# Patient Record
Sex: Female | Born: 1935 | Race: White | Hispanic: No | Smoking: Never smoker
Health system: Southern US, Community
[De-identification: ages and names within clinical notes are randomized; demographics above are authoritative.]

## PROBLEM LIST (undated history)

## (undated) DIAGNOSIS — M810 Age-related osteoporosis without current pathological fracture: Secondary | ICD-10-CM

## (undated) DIAGNOSIS — M1990 Unspecified osteoarthritis, unspecified site: Secondary | ICD-10-CM

## (undated) DIAGNOSIS — E785 Hyperlipidemia, unspecified: Secondary | ICD-10-CM

## (undated) DIAGNOSIS — M549 Dorsalgia, unspecified: Secondary | ICD-10-CM

## (undated) DIAGNOSIS — H04123 Dry eye syndrome of bilateral lacrimal glands: Secondary | ICD-10-CM

## (undated) DIAGNOSIS — G8929 Other chronic pain: Secondary | ICD-10-CM

## (undated) DIAGNOSIS — M48061 Spinal stenosis, lumbar region without neurogenic claudication: Secondary | ICD-10-CM

## (undated) DIAGNOSIS — H409 Unspecified glaucoma: Secondary | ICD-10-CM

## (undated) DIAGNOSIS — I509 Heart failure, unspecified: Secondary | ICD-10-CM

## (undated) DIAGNOSIS — G629 Polyneuropathy, unspecified: Secondary | ICD-10-CM

## (undated) DIAGNOSIS — H269 Unspecified cataract: Secondary | ICD-10-CM

## (undated) DIAGNOSIS — J84112 Idiopathic pulmonary fibrosis: Secondary | ICD-10-CM

## (undated) DIAGNOSIS — E559 Vitamin D deficiency, unspecified: Secondary | ICD-10-CM

## (undated) HISTORY — DX: Dry eye syndrome of bilateral lacrimal glands: H04.123

## (undated) HISTORY — DX: Unspecified cataract: H26.9

## (undated) HISTORY — DX: Unspecified osteoarthritis, unspecified site: M19.90

## (undated) HISTORY — PX: KNEE ARTHROSCOPY: SHX127

## (undated) HISTORY — PX: ABDOMINAL HYSTERECTOMY: SHX81

## (undated) HISTORY — DX: Vitamin D deficiency, unspecified: E55.9

## (undated) HISTORY — PX: LUMBAR LAMINECTOMY: SHX95

## (undated) HISTORY — DX: Hyperlipidemia, unspecified: E78.5

## (undated) HISTORY — PX: OTHER SURGICAL HISTORY: SHX169

## (undated) HISTORY — DX: Dorsalgia, unspecified: M54.9

## (undated) HISTORY — DX: Other chronic pain: G89.29

## (undated) HISTORY — DX: Age-related osteoporosis without current pathological fracture: M81.0

## (undated) HISTORY — DX: Unspecified glaucoma: H40.9

## (undated) HISTORY — DX: Polyneuropathy, unspecified: G62.9

## (undated) HISTORY — DX: Idiopathic pulmonary fibrosis: J84.112

## (undated) HISTORY — PX: CATARACT EXTRACTION: SUR2

## (undated) HISTORY — DX: Spinal stenosis, lumbar region without neurogenic claudication: M48.061

## (undated) HISTORY — DX: Heart failure, unspecified: I50.9

---

## 1997-11-01 ENCOUNTER — Encounter: Payer: Self-pay | Admitting: Neurosurgery

## 1997-11-01 ENCOUNTER — Ambulatory Visit (HOSPITAL_COMMUNITY): Admission: RE | Admit: 1997-11-01 | Discharge: 1997-11-01 | Payer: Self-pay | Admitting: Neurosurgery

## 1997-11-15 ENCOUNTER — Encounter: Payer: Self-pay | Admitting: Neurosurgery

## 1997-11-15 ENCOUNTER — Ambulatory Visit (HOSPITAL_COMMUNITY): Admission: RE | Admit: 1997-11-15 | Discharge: 1997-11-15 | Payer: Self-pay | Admitting: Neurosurgery

## 1997-11-29 ENCOUNTER — Encounter: Payer: Self-pay | Admitting: Neurosurgery

## 1997-11-29 ENCOUNTER — Ambulatory Visit (HOSPITAL_COMMUNITY): Admission: RE | Admit: 1997-11-29 | Discharge: 1997-11-29 | Payer: Self-pay | Admitting: Neurosurgery

## 2001-04-13 ENCOUNTER — Encounter: Payer: Self-pay | Admitting: Neurosurgery

## 2001-04-13 ENCOUNTER — Encounter: Admission: RE | Admit: 2001-04-13 | Discharge: 2001-04-13 | Payer: Self-pay | Admitting: Neurosurgery

## 2001-05-11 ENCOUNTER — Encounter: Payer: Self-pay | Admitting: Neurosurgery

## 2001-05-11 ENCOUNTER — Encounter: Admission: RE | Admit: 2001-05-11 | Discharge: 2001-05-11 | Payer: Self-pay | Admitting: Neurosurgery

## 2001-05-25 ENCOUNTER — Encounter: Admission: RE | Admit: 2001-05-25 | Discharge: 2001-05-25 | Payer: Self-pay | Admitting: Neurosurgery

## 2001-05-25 ENCOUNTER — Encounter: Payer: Self-pay | Admitting: Neurosurgery

## 2001-06-08 ENCOUNTER — Encounter: Admission: RE | Admit: 2001-06-08 | Discharge: 2001-06-08 | Payer: Self-pay | Admitting: Neurosurgery

## 2001-06-08 ENCOUNTER — Encounter: Payer: Self-pay | Admitting: Neurosurgery

## 2001-11-13 ENCOUNTER — Encounter: Payer: Self-pay | Admitting: Neurosurgery

## 2001-11-13 ENCOUNTER — Inpatient Hospital Stay (HOSPITAL_COMMUNITY): Admission: RE | Admit: 2001-11-13 | Discharge: 2001-11-14 | Payer: Self-pay | Admitting: Neurosurgery

## 2002-07-13 ENCOUNTER — Encounter: Payer: Self-pay | Admitting: Neurosurgery

## 2002-07-13 ENCOUNTER — Ambulatory Visit (HOSPITAL_COMMUNITY): Admission: RE | Admit: 2002-07-13 | Discharge: 2002-07-13 | Payer: Self-pay | Admitting: Neurosurgery

## 2002-08-09 ENCOUNTER — Encounter: Payer: Self-pay | Admitting: Neurosurgery

## 2002-08-09 ENCOUNTER — Encounter: Admission: RE | Admit: 2002-08-09 | Discharge: 2002-08-09 | Payer: Self-pay | Admitting: Neurosurgery

## 2002-08-19 ENCOUNTER — Encounter: Admission: RE | Admit: 2002-08-19 | Discharge: 2002-08-19 | Payer: Self-pay | Admitting: Neurosurgery

## 2002-08-19 ENCOUNTER — Encounter: Payer: Self-pay | Admitting: Neurosurgery

## 2002-09-02 ENCOUNTER — Encounter: Payer: Self-pay | Admitting: Neurosurgery

## 2002-09-02 ENCOUNTER — Encounter: Admission: RE | Admit: 2002-09-02 | Discharge: 2002-09-02 | Payer: Self-pay | Admitting: Neurosurgery

## 2003-08-01 ENCOUNTER — Encounter: Admission: RE | Admit: 2003-08-01 | Discharge: 2003-08-01 | Payer: Self-pay | Admitting: Neurosurgery

## 2003-12-21 ENCOUNTER — Ambulatory Visit (HOSPITAL_COMMUNITY): Admission: RE | Admit: 2003-12-21 | Discharge: 2003-12-21 | Payer: Self-pay | Admitting: Family Medicine

## 2004-01-10 ENCOUNTER — Other Ambulatory Visit: Admission: RE | Admit: 2004-01-10 | Discharge: 2004-01-10 | Payer: Self-pay | Admitting: Family Medicine

## 2004-03-30 ENCOUNTER — Ambulatory Visit (HOSPITAL_COMMUNITY): Admission: RE | Admit: 2004-03-30 | Discharge: 2004-03-30 | Payer: Self-pay | Admitting: Family Medicine

## 2004-04-11 ENCOUNTER — Encounter: Admission: RE | Admit: 2004-04-11 | Discharge: 2004-04-11 | Payer: Self-pay | Admitting: Neurosurgery

## 2004-05-09 ENCOUNTER — Encounter: Admission: RE | Admit: 2004-05-09 | Discharge: 2004-05-09 | Payer: Self-pay | Admitting: Neurosurgery

## 2004-05-24 ENCOUNTER — Encounter: Admission: RE | Admit: 2004-05-24 | Discharge: 2004-05-24 | Payer: Self-pay | Admitting: Neurosurgery

## 2004-05-31 ENCOUNTER — Encounter: Admission: RE | Admit: 2004-05-31 | Discharge: 2004-05-31 | Payer: Self-pay | Admitting: Neurosurgery

## 2004-09-14 ENCOUNTER — Encounter: Admission: RE | Admit: 2004-09-14 | Discharge: 2004-09-14 | Payer: Self-pay | Admitting: Neurosurgery

## 2004-12-10 ENCOUNTER — Encounter: Admission: RE | Admit: 2004-12-10 | Discharge: 2004-12-10 | Payer: Self-pay | Admitting: Neurosurgery

## 2005-05-17 ENCOUNTER — Encounter: Admission: RE | Admit: 2005-05-17 | Discharge: 2005-05-17 | Payer: Self-pay | Admitting: Neurosurgery

## 2005-06-21 ENCOUNTER — Encounter: Admission: RE | Admit: 2005-06-21 | Discharge: 2005-06-21 | Payer: Self-pay | Admitting: Neurosurgery

## 2005-11-19 ENCOUNTER — Encounter: Admission: RE | Admit: 2005-11-19 | Discharge: 2005-11-19 | Payer: Self-pay | Admitting: Neurosurgery

## 2006-02-10 ENCOUNTER — Other Ambulatory Visit: Admission: RE | Admit: 2006-02-10 | Discharge: 2006-02-10 | Payer: Self-pay | Admitting: Family Medicine

## 2006-08-16 ENCOUNTER — Encounter: Admission: RE | Admit: 2006-08-16 | Discharge: 2006-08-16 | Payer: Self-pay | Admitting: Neurosurgery

## 2006-08-26 ENCOUNTER — Encounter: Admission: RE | Admit: 2006-08-26 | Discharge: 2006-08-26 | Payer: Self-pay | Admitting: Neurosurgery

## 2006-09-12 ENCOUNTER — Encounter: Admission: RE | Admit: 2006-09-12 | Discharge: 2006-09-12 | Payer: Self-pay | Admitting: Neurosurgery

## 2006-10-10 ENCOUNTER — Encounter: Admission: RE | Admit: 2006-10-10 | Discharge: 2006-10-10 | Payer: Self-pay | Admitting: Family Medicine

## 2007-05-19 ENCOUNTER — Encounter: Admission: RE | Admit: 2007-05-19 | Discharge: 2007-05-19 | Payer: Self-pay | Admitting: Neurosurgery

## 2007-06-02 ENCOUNTER — Encounter: Admission: RE | Admit: 2007-06-02 | Discharge: 2007-06-02 | Payer: Self-pay | Admitting: Neurosurgery

## 2008-05-19 ENCOUNTER — Encounter (INDEPENDENT_AMBULATORY_CARE_PROVIDER_SITE_OTHER): Payer: Self-pay | Admitting: Internal Medicine

## 2008-05-19 ENCOUNTER — Inpatient Hospital Stay (HOSPITAL_COMMUNITY): Admission: EM | Admit: 2008-05-19 | Discharge: 2008-05-24 | Payer: Self-pay | Admitting: Emergency Medicine

## 2008-05-20 ENCOUNTER — Ambulatory Visit: Payer: Self-pay | Admitting: Hematology and Oncology

## 2008-09-08 LAB — HM PAP SMEAR

## 2009-01-14 LAB — HM DEXA SCAN

## 2009-07-24 ENCOUNTER — Encounter: Admission: RE | Admit: 2009-07-24 | Discharge: 2009-07-24 | Payer: Self-pay | Admitting: Neurosurgery

## 2009-07-27 ENCOUNTER — Encounter: Admission: RE | Admit: 2009-07-27 | Discharge: 2009-07-27 | Payer: Self-pay | Admitting: Neurosurgery

## 2009-08-10 ENCOUNTER — Encounter: Admission: RE | Admit: 2009-08-10 | Discharge: 2009-08-10 | Payer: Self-pay | Admitting: Neurosurgery

## 2010-02-04 ENCOUNTER — Encounter: Payer: Self-pay | Admitting: Neurosurgery

## 2010-02-05 ENCOUNTER — Encounter: Payer: Self-pay | Admitting: Family Medicine

## 2010-04-23 ENCOUNTER — Encounter: Payer: Self-pay | Admitting: Family Medicine

## 2010-04-24 LAB — CULTURE, BLOOD (ROUTINE X 2): Culture: NO GROWTH

## 2010-04-24 LAB — CBC
HCT: 29.7 % — ABNORMAL LOW (ref 36.0–46.0)
HCT: 31.6 % — ABNORMAL LOW (ref 36.0–46.0)
HCT: 32.5 % — ABNORMAL LOW (ref 36.0–46.0)
HCT: 32.9 % — ABNORMAL LOW (ref 36.0–46.0)
Hemoglobin: 10.4 g/dL — ABNORMAL LOW (ref 12.0–15.0)
Hemoglobin: 11 g/dL — ABNORMAL LOW (ref 12.0–15.0)
Hemoglobin: 11.3 g/dL — ABNORMAL LOW (ref 12.0–15.0)
Hemoglobin: 11.4 g/dL — ABNORMAL LOW (ref 12.0–15.0)
MCHC: 34.6 g/dL (ref 30.0–36.0)
MCHC: 34.6 g/dL (ref 30.0–36.0)
MCHC: 34.8 g/dL (ref 30.0–36.0)
MCHC: 34.8 g/dL (ref 30.0–36.0)
MCHC: 35 g/dL (ref 30.0–36.0)
MCV: 83.2 fL (ref 78.0–100.0)
Platelets: 66 10*3/uL — ABNORMAL LOW (ref 150–400)
Platelets: 69 10*3/uL — ABNORMAL LOW (ref 150–400)
Platelets: 95 10*3/uL — ABNORMAL LOW (ref 150–400)
RBC: 3.89 MIL/uL (ref 3.87–5.11)
RBC: 4.11 MIL/uL (ref 3.87–5.11)
RDW: 13.5 % (ref 11.5–15.5)
RDW: 13.9 % (ref 11.5–15.5)
RDW: 14.2 % (ref 11.5–15.5)
RDW: 14.3 % (ref 11.5–15.5)
RDW: 14.6 % (ref 11.5–15.5)
RDW: 14.6 % (ref 11.5–15.5)
RDW: 15.3 % (ref 11.5–15.5)
WBC: 4 10*3/uL (ref 4.0–10.5)
WBC: 4.7 10*3/uL (ref 4.0–10.5)

## 2010-04-24 LAB — URINALYSIS, MICROSCOPIC ONLY
Bilirubin Urine: NEGATIVE
Glucose, UA: NEGATIVE mg/dL
Leukocytes, UA: NEGATIVE
Nitrite: NEGATIVE
Specific Gravity, Urine: 1.01 (ref 1.005–1.030)
pH: 5.5 (ref 5.0–8.0)

## 2010-04-24 LAB — COMPREHENSIVE METABOLIC PANEL
ALT: 21 U/L (ref 0–35)
ALT: 23 U/L (ref 0–35)
AST: 44 U/L — ABNORMAL HIGH (ref 0–37)
AST: 51 U/L — ABNORMAL HIGH (ref 0–37)
AST: 59 U/L — ABNORMAL HIGH (ref 0–37)
Albumin: 2 g/dL — ABNORMAL LOW (ref 3.5–5.2)
Albumin: 2.4 g/dL — ABNORMAL LOW (ref 3.5–5.2)
Alkaline Phosphatase: 73 U/L (ref 39–117)
Alkaline Phosphatase: 75 U/L (ref 39–117)
Alkaline Phosphatase: 75 U/L (ref 39–117)
Alkaline Phosphatase: 81 U/L (ref 39–117)
BUN: 10 mg/dL (ref 6–23)
BUN: 14 mg/dL (ref 6–23)
BUN: 8 mg/dL (ref 6–23)
CO2: 20 mEq/L (ref 19–32)
CO2: 21 mEq/L (ref 19–32)
Calcium: 7 mg/dL — ABNORMAL LOW (ref 8.4–10.5)
Calcium: 7 mg/dL — ABNORMAL LOW (ref 8.4–10.5)
Calcium: 7.1 mg/dL — ABNORMAL LOW (ref 8.4–10.5)
Calcium: 7.8 mg/dL — ABNORMAL LOW (ref 8.4–10.5)
Chloride: 109 mEq/L (ref 96–112)
Chloride: 109 mEq/L (ref 96–112)
Creatinine, Ser: 0.7 mg/dL (ref 0.4–1.2)
GFR calc Af Amer: >60 mL/min (ref >60)
GFR calc Af Amer: >60 mL/min (ref >60)
GFR calc non Af Amer: >60 mL/min (ref >60)
GFR calc non Af Amer: >60 mL/min (ref >60)
Glucose, Bld: 106 mg/dL — ABNORMAL HIGH (ref 70–99)
Glucose, Bld: 122 mg/dL — ABNORMAL HIGH (ref 70–99)
Potassium: 3.1 mEq/L — ABNORMAL LOW (ref 3.5–5.1)
Potassium: 3.7 mEq/L (ref 3.5–5.1)
Potassium: 4.5 mEq/L (ref 3.5–5.1)
Sodium: 133 mEq/L — ABNORMAL LOW (ref 135–145)
Sodium: 133 mEq/L — ABNORMAL LOW (ref 135–145)
Total Bilirubin: 0.4 mg/dL (ref 0.3–1.2)
Total Bilirubin: 0.5 mg/dL (ref 0.3–1.2)
Total Protein: 4 g/dL — ABNORMAL LOW (ref 6.0–8.3)
Total Protein: 4.2 g/dL — ABNORMAL LOW (ref 6.0–8.3)
Total Protein: 4.4 g/dL — ABNORMAL LOW (ref 6.0–8.3)
Total Protein: 4.8 g/dL — ABNORMAL LOW (ref 6.0–8.3)

## 2010-04-24 LAB — DIFFERENTIAL
Basophils Absolute: 0 10*3/uL (ref 0.0–0.1)
Basophils Absolute: 0 10*3/uL (ref 0.0–0.1)
Basophils Absolute: 0 10*3/uL (ref 0.0–0.1)
Basophils Absolute: 0 10*3/uL (ref 0.0–0.1)
Basophils Absolute: 0 10*3/uL (ref 0.0–0.1)
Basophils Relative: 0 % (ref 0–1)
Basophils Relative: 1 % (ref 0–1)
Eosinophils Absolute: 0 10*3/uL (ref 0.0–0.7)
Eosinophils Absolute: 0 10*3/uL (ref 0.0–0.7)
Eosinophils Absolute: 0 10*3/uL (ref 0.0–0.7)
Eosinophils Relative: 0 % (ref 0–5)
Eosinophils Relative: 0 % (ref 0–5)
Eosinophils Relative: 0 % (ref 0–5)
Eosinophils Relative: 0 % (ref 0–5)
Lymphocytes Relative: 22 % (ref 12–46)
Lymphocytes Relative: 26 % (ref 12–46)
Lymphs Abs: 1 10*3/uL (ref 0.7–4.0)
Lymphs Abs: 1 10*3/uL (ref 0.7–4.0)
Monocytes Absolute: 0.2 10*3/uL (ref 0.1–1.0)
Monocytes Absolute: 0.3 10*3/uL (ref 0.1–1.0)
Monocytes Absolute: 0.3 10*3/uL (ref 0.1–1.0)
Monocytes Absolute: 0.4 10*3/uL (ref 0.1–1.0)
Monocytes Absolute: 0.4 10*3/uL (ref 0.1–1.0)
Monocytes Relative: 7 % (ref 3–12)
Monocytes Relative: 7 % (ref 3–12)
Neutro Abs: 2.6 10*3/uL (ref 1.7–7.7)
Neutro Abs: 3.1 10*3/uL (ref 1.7–7.7)
Neutro Abs: 3.3 10*3/uL (ref 1.7–7.7)
Neutrophils Relative %: 78 % — ABNORMAL HIGH (ref 43–77)

## 2010-04-24 LAB — URINE CULTURE: Colony Count: NO GROWTH

## 2010-04-24 LAB — POCT I-STAT, CHEM 8
Glucose, Bld: 97 mg/dL (ref 70–99)
HCT: 39 % (ref 36.0–46.0)
Hemoglobin: 13.3 g/dL (ref 12.0–15.0)
Potassium: 3.7 mEq/L (ref 3.5–5.1)
Sodium: 129 mEq/L — ABNORMAL LOW (ref 135–145)
TCO2: 21 mmol/L (ref 0–100)

## 2010-04-24 LAB — URINALYSIS, ROUTINE W REFLEX MICROSCOPIC
Bilirubin Urine: NEGATIVE
Nitrite: NEGATIVE
Nitrite: NEGATIVE
Specific Gravity, Urine: 1.006 (ref 1.005–1.030)
Specific Gravity, Urine: 1.02 (ref 1.005–1.030)
Urobilinogen, UA: 1 mg/dL (ref 0.0–1.0)
Urobilinogen, UA: 1 mg/dL (ref 0.0–1.0)
pH: 5.5 (ref 5.0–8.0)

## 2010-04-24 LAB — PREALBUMIN: Prealbumin: 5 mg/dL — ABNORMAL LOW (ref 18.0–45.0)

## 2010-04-24 LAB — T4, FREE: Free T4: 0.93 ng/dL (ref 0.80–1.80)

## 2010-04-24 LAB — PHOSPHORUS: Phosphorus: 1.8 mg/dL — ABNORMAL LOW (ref 2.3–4.6)

## 2010-04-24 LAB — APTT: aPTT: 45 seconds — ABNORMAL HIGH (ref 24–37)

## 2010-04-24 LAB — PROTIME-INR
INR: 1.3 (ref 0.00–1.49)
Prothrombin Time: 16.9 seconds — ABNORMAL HIGH (ref 11.6–15.2)

## 2010-05-29 NOTE — H&P (Signed)
[name], [name] NO.:  [id number]   MEDICAL RECORD NO.:  [id number]          PATIENT TYPE:  EMS   LOCATION:  MAJO                         FACILITY:  [facility]   PHYSICIAN:  Manus Gunning, MD      DATE OF BIRTH:  08/15/1925   DATE OF ADMISSION:  05/18/2008  DATE OF DISCHARGE:                              HISTORY & PHYSICAL   ADMITTING SERVICE:  Hospitalist service, [facility].   CHIEF COMPLAINT:  Generalized fatigue and mild lethargy with  hypotension.   HPI:  [name] is a pleasant 77 year old Caucasian female with a  history of hypertension and frequent UTIs.  She presented to the  emergency department with secondary systolic blood pressure measurements  in the 80s at home.  She has complaints of mild fatigue and generalized  weakness.  She claims that over the past 5 days she has had decreased  p.o. intake of food and water, and was recently diagnosed with a urinary  tract infection on Monday at which time Bactrim Double Strength twice a  day was started.  She unfortunately developed a rash on her lower  extremities prompting the discontinuation of the antibiotic and changed  this to Cipro 500 mg twice a day this Wednesday.  She claims for the  first time this morning she ate some food.  Otherwise, she has felt  nauseated and unwell, and has not taken any p.o. intake.  In the  emergency department at the time of presentation her blood pressure was  83/42, and despite approximately 500 mL of normal saline bolus her  maximum systolic blood pressure has gone up to 113/38 and 96/43, but  continues to drop back into the 80s.  Her tele monitor demonstrates  sinus rhythm and heart rate of approximately 70-75 beats per minute.  The patient herself denies headaches, denies lightheadedness, denies  syncope or presyncope.  No recent falls, no tenderness, no chest pain,  palpitations, PND, orthopnea, no abdominal pain.  Does complain of  nausea.  No vomiting with  decreased p.o. intake secondary to generalized  feeling of non-well-being.  Denies musculoskeletal complaints.  Denies  dysuria, currently improved.  No polyuria.  Has past history of  hesitancy.  No diarrhea or constipation, hematuria, bright red blood per  rectum or melenic stools.   PAST MEDICAL/SURGICAL HISTORY:  1. Hypertension.  2. Frequent urinary tract infections followed by urologist.  3. Hypothyroidism.  4. Dyslipidemia.  5. Hysterectomy.  6. Right knee surgery.  7. Lower back surgery set for DDD.   ALLERGIES:  1. PENICILLIN.  2. SULFA.  3. ERYTHROMYCIN.   FAMILY HISTORY:  Mother deceased with history of bone cancer.  Father  had a history of pneumonia.   SOCIAL HISTORY:  Denies tobacco, illicits or alcohol.  Lives  independently at home with her sister and children living close by.   HOME MEDICATIONS:  1. Fosamax 35 mg per week.  2. Calcium 500 mg daily.  3. Multiple vitamin p.o. daily.  4. Aspirin 325 mg p.o. daily.  5. Tramadol/acetaminophen 50 mg daily.  6. Benazepril 20 daily.  7.  Zocor 40 mg daily.  8. Plavix 75 mg p.o. daily.  9. Cipro oral 500 mg b.i.d.   REVIEW OF SYSTEMS:  Essentially 14-point review of systems performed.  Pertinent positives and negatives as described above.   PHYSICAL EXAMINATION:  VITALS:  At the time of presentation temperature  97.6, heart rate 70, respiratory rate 18, blood pressure 83/42, O2  saturation 100% on room air.  GENERAL:  A well-nourished, well-developed elderly lady lying in bed  comfortably in no apparent distress.  HEENT:  Normocephalic, atraumatic.  Dry oral mucosa.  No thrush,  erythema or postnasal drip.  Eyes anicteric.  Extraocular muscles are  intact.  Pupils are equal and reactive to light and accommodation.  NECK:  Supple, good range of motion.  No thyromegaly.  Flat neck veins.  CARDIOVASCULAR:  S1, S2 normal, regular rate and rhythm.  Positive  systolic murmur.  No rubs or gallops.  RS:  Air entry is  bilaterally equal.  No rales, rhonchi or wheezes  appreciated.  ABDOMEN:  Soft, nontender, nondistended, positive bowel sounds.  No  organomegaly.  EXTREMITIES:  No cyanosis, clubbing or edema.  Positive bilateral  dorsalis pedis.  CNS:  Alert and oriented x3.  Cranial nerves II-XII grossly intact.  Power, sensation, reflexes bilaterally symmetrical.  SKIN:  No breakdown, swelling, ulcerations or masses.  HEMATOLOGY/ONCOLOGY:  No palpable lymphadenopathy, no ecchymoses, no  bruising, no petechiae.   LABORATORY TESTS:  WBC 4200, hemoglobin 12.9, hematocrit 37.2, platelet  count 81, polymorphs 78%.  Sodium 129, potassium 3.7, chloride 98, BUN  22, creatinine 1.5, glucose 97.  Hemoglobin 13.3, hematocrit 39.  UA:  Nitrite and leukocyte esterase negative.  No hemoglobin, bili or ketones  or glucose in blood.   ASSESSMENT AND PLAN:  1. Volume depletion, dehydration.  Will add normal saline at 125      mL/hour per 1 liter; then change to 75 mL/hour.  We will monitor      BUN and creatinine in the a.m.  If the renal function remain high,      may require further studies.  I believe she is volume depleted and      dehydrated secondary to decreased p.o. intake.  2. Acute renal insufficiency.  Do not know her baseline, but according      to her family they claim that her baseline is normal.  Therefore at      this time continue to monitor as above and address.  Check a urine      creatinine as well as a sodium.  No need for renal ultrasound at      this time.  I feel also possibly that mild deterioration in her      kidney function is secondary to Bactrim.  3. Hypertension.  At this time will hold her benazepril secondary to      hypotension.  If she starts becoming      hypertensive again, we can reinitiate at lower doses and titrate as      tolerated.  4. Gastrointestinal, deep venous thrombosis prophylaxis.  Place on      sequential compression devices and Protonix 40 mg p.o.  daily.      Manus Gunning, MD  Electronically Signed     SP/MEDQ  D:  05/19/2008  T:  05/19/2008  Job:  [id number]

## 2010-05-29 NOTE — Consult Note (Signed)
[name], [name]                ACCOUNT NO.:  [id number]   MEDICAL RECORD NO.:  [id number]          PATIENT TYPE:  INP   LOCATION:  5032                         FACILITY:  [facility]   PHYSICIAN:  Lauretta I. Odogwu, M.D.DATE OF BIRTH:  03/31/1925   DATE OF CONSULTATION:  05/20/2008  DATE OF DISCHARGE:                                 CONSULTATION   REASON FOR CONSULTATION:  Pancytopenia.   HISTORY OF PRESENT ILLNESS:  [name] is an 77 year old woman with  multiple medical problems listed below including a recent history of  UTI.  The patient was admitted with generalized fatigue, hypotension,  and poor p.o. intake for the last 5 days.  As as outpatient, she was  treated with Bactrim for her UTI and had lower extremity rash felt to be  due to a drug reaction.  She was switched to Ciprofloxacin 500 mg b.i.d.  on May 5.  Upon admission, she was found to be dehydrated with acute  renal insufficiency.  Serum creatinine was 1.5. Her admitting CBC showed  a H/H of 12.9 and 37.2, her white count was of 4.2 with an ANC of 3.3,  and a platelet of 81,000.  Platelet counts fell to 52,000.  There has  been no evidence of acute blood loss or bruising.  The patient has not  received a blood transfusion recently.  The patient was not placed on  heparin on admission. She remains symptomatic with shortness of breath  on exertion, fatigue, and has been experiencing subjective / objective  fever which is now improved as well.  We were asked to see her, due to  the overall low counts.  However, it is important to mention that the  new CBC performed within the last hour, showed her white blood cell  count to be  now 3.6, ANC of 2.8, her hemoglobin and hematocrit 11.4 and  32.9, with a platelet count of 66. The review of the smear, shows no  schistocytes and was unremarkable for any other abnormalities.   PAST MEDICAL HISTORY:  1. Hypertension.  2. Frequent history of UTIs, last one on May 14, 2008.  3.  Hypothyroidism - mild multinodular goiter.  4. Dyslipidemia.  5. Benign ovarian tumor many years ago, per chart report.  6. Questionable left pleural effusion per chest x-ray on this      admission.  7. Known pulmonary nodules since the year 2003, which last CT of the      chest in the year 2008, showed stable CT, with no changes.  8. Urinary hesitancy.   SURGERIES/ PROCEDURE:  1. Status post hysterectomy in the past.  2. Status post right knee surgery.  3. Status post L4-L5 lumbar decompression/ hemilaminectomy by Dr.      Franky Macho,  October 2003.   ALLERGIES:  PENICILLIN, SULFA, ERYTHROMYCIN.   MEDICATIONS:  Boost, Zofran, K-Dur, Senokot, Zocor, Tylenol, Dulcolax,  Relafen, Zofran, Fleet's enema.   REVIEW OF SYSTEMS:  See HPI for significant positives.  Occasionally she  has some chills and night sweats. She also is complaining of a mild  dyspnea on exertion,  feeling very tired, her appetite has been decreased  over the last couple of weeks, but she denies any weight loss.  She  denies any confusion or vision changes.  No dysphagia.  No cough or  abdominal pain.  No GERD symptoms.  She denies any blood in the stools  or dark stools.  She denies numbness and swelling, but she does have  chronic back pain.  Rest of the review of systems is negative.   FAMILY HISTORY:  Mother died with bone cancer.  Father died with  pneumonia one sister died with bone cancer and one sister died with  diabetes.   SOCIAL HISTORY:  The patient is widowed.  She has two children.  No  tobacco or alcohol history.  She lives in [place].   PHYSICAL EXAMINATION:  This is an ill-appearing 77 year old white female  in no acute distress, able to answer questions appropriately.  Blood pressure 99/51, pulse 79, respirations 20, temperature 103.5,  later changing to 98.7, pulse oximetry 94 in room air.  Weight 63.9 kg,  height 57 inches.  HEENT:  Normocephalic, atraumatic.  Sclerae anicteric.  Oral  cavity  without lesions or thrush.  NECK:  Supple.  No cervical or supraclavicular masses.  LUNGS:  Clear to auscultation bilaterally.  No axillary masses.  CARDIOVASCULAR:  Regular rate and rhythm without murmurs, rubs or  gallops.  ABDOMEN:  Soft, nontender.  Bowel sounds x4.  No hepatosplenomegaly.  EXTREMITIES:  No clubbing or cyanosis.  The patient has bilateral PAS  calves, no inguinal masses.  SKIN:  Without bruising or petechial rash.  BREASTS:  Not examined.  GU/RECTAL:  Deferred.  MUSCULOSKELETAL:  No spinal tenderness.  NEURO:  Nonfocal.   LABS  Sodium 134, potassium 3.7, BUN 10, creatinine 0.81, chloride 108, CO2  20, total bilirubin 0.6, alkaline phosphatase 73, AST 53, ALT 17, total  protein 4.0, albumin 2.1, calcium 7.0, TSH 0.492, magnesium 1.5.   IMPRESSION AND PLAN:  1. Thrombocytopenia, multifactorial, with no evidence of bleeding, no      schistocytes, or heparin on board.  The patient does have a history      of  Bactrim induce rash which can cause low platelets versus      underlying infection.  The patient is febrile.  2. The patient is not neutropenic, her ANC is adequate.  3. Mild anemia, not accounting for symptoms.   Do not think that her bone marrow biopsy is required at this time a  biopsy.  Watch her CBC with differential daily.  Note that the patient  is orthostatic with a possible new left pleural effusion.  It is  important to evaluate her cardiac status.  Does she require  a stress  dose of steroids ?  CT of the chest in the year 2008, had revealed  stable pleural nodules, consider a CT of the chest during this  hospitalization to further delineate.   Thank you very much for allowing [place] the opportunity to participate in  the care of this patient, will follow the counts, and advise further if  required.      Marlowe Kays, P.A.      Lauretta I. Odogwu, M.D.  Electronically Signed    SW/MEDQ  D:  05/20/2008  T:  05/20/2008  Job:   [id number]   cc:   Dr. Christell Constant

## 2010-05-29 NOTE — Discharge Summary (Signed)
NAMEFATIM, VANDERSCHAAF                ACCOUNT NO.:  [id number]   MEDICAL RECORD NO.:  [id number]          PATIENT TYPE:  INP   LOCATION:  5032                         FACILITY:  [facility]   PHYSICIAN:  Renee Ramus, MD       DATE OF BIRTH:  1925/11/10   DATE OF ADMISSION:  05/18/2008  DATE OF DISCHARGE:  05/24/2008                               DISCHARGE SUMMARY   PRIMARY DISCHARGE DIAGNOSES:  Dehydration and malnutrition.   SECONDARY DIAGNOSES:  1. Hypotension.  2. Gastroesophageal reflux disease.  3. Hypercholesterolemia.  4. Malnutrition.  5. Thrombocytopenia.   HOSPITAL COURSE BY PROBLEM:  1. Dehydration and hypotension.  The patient is an 77 year old female      who is admitted secondary to dehydration and hypotension.  The      patient has had marked decrease p.o. intake secondary to nausea.      The patient has done well with IV fluids.  We have stopped her      blood pressure medication and she is now stable for discharge.  The      patient has no evidence currently of dehydration.  We have stopped      her Fosamax secondary to problems with stomach pain and she has      been encouraged to eat high-calorie shakes t.i.d.  The patient did      have an evaluation with physical therapy and occupational therapy      and they did not believe that she would benefit from inpatient      suabcute rehab at this time.  2. Thrombocytopenia with evidence of pancytopenia.  The patient did      have evidence of pancytopenia with decreased white blood cell      count, decreased platelet count, and anemia.  The patient was seen      by Heme/Onc.  They decided that her thrombocytopenia was secondary      to a recent use of Bactrim for UTI.  She was certainly recovered.      Her thrombocyte count without any type of intervention.  A bone      marrow biopsy was discussed and this would be deferred, pending the      patient's status.  3. Hyperlipidemia.  The patient will continue statin therapy.  4. Malnutrition.  The patient has had counseling respect to increasing      her p.o. intake.  5. Hypothyroid.  The patient is currently therapeutic on her      levothyroxine dosage.   LABORATORY DATA:  1. Pancytopenia with white blood cell count of 2.9 increasing to 4.2,      hemoglobin of 10.4 increasing to 11.9, and platelet count of 52      increasing to 135.  2. INR 1.3.  3. Dehydration with initial BUN of 14.  Creatinine is 0.95.  BUN      decreasing to 8 and creatinine decreasing to 0.62.  4. Hypoalbuminemia with an albumin of 2.0 and prealbumin of 5.0.  5. TSH of 0.492 with a free T4 of 0.93.  6 . UA showing  no evidence of infection.  1. Blood cultures; 3 independent blood cultures x2, all yield no      growth to date.   STUDIES:  Plain view of the chest showing chronic bronchitic lung  changes consistent with bronchitis.   MEDICATIONS ON DISCHARGE:  1. Fosamax, which has been discontinued.  2. Calcium 500 mg p.o. daily.  3. Multivitamin, which had been discontinued.  4. Aspirin 325 mg which had been discontinued.  5. Tramadol/acetaminophen 50 mg tablets 1 p.o. b.i.d. p.r.n. pain.  6. Benazepril 20 mg p.o. daily which had been discontinued.  7. Zocor 40 mg p.o. daily.  8. Nabumetone 750 mg p.o. daily, which had been discontinued.  9. Cipro, which had been discontinued.  10.Darvocet-N 100 1-2 p.o. q.6 h. p.r.n. pain.   There are no labs studies pending at the time of discharge.  The patient  is in stable condition and anxious for discharge.  Time spent 35  minutes.      Renee Ramus, MD  Electronically Signed     JF/MEDQ  D:  05/24/2008  T:  05/25/2008  Job:  [id number]   cc:   Ernestina Penna, M.D.

## 2010-06-01 NOTE — H&P (Signed)
NAME:  NIL, BOLSER                          ACCOUNT NO.:  [id number]   MEDICAL RECORD NO.:  [id number]                   PATIENT TYPE:  INP   LOCATION:  2892                                 FACILITY:  [facility]   PHYSICIAN:  Coletta Memos, M.D.                  DATE OF BIRTH:  04/11/1925   DATE OF ADMISSION:  11/13/2001  DATE OF DISCHARGE:                                HISTORY & PHYSICAL   PREOPERATIVE DIAGNOSES:  Lumbar stenosis, spondylolisthesis L4-5, neurogenic  claudication.   INDICATIONS:  [name] is a 77 year old woman who I have been seen since  10/99 for the evaluation of neurogenic claudication. She has a long history  of pain in the back and in both legs posteriorly which is worse when she is  up and walking. She has had numbness in the foot and says it feels like she  is going to sleep. She frankly did well over the four years. She got steroid  injections on two occasions, the first one working while the second round  not nearly as successful. Recently, over the last two months, [name] has  gotten worse. The pain with walking is increased. The patient standing is  increased significantly. She at this point is just not able to do the things  she likes to do which is her pickling and canning and gardening. That being  the case, I told her it was reasonable finally to go ahead and do a  decompression.   [name] wears glasses. She does have dentures. Some swelling in the feet  and hands and pain with walking. She has had incontinence at times and  nephrolithiasis. Arthritis, thyroid disease, hormonal problems. She does not  have constitutional, respiratory, skin, neurological, gastrointestinal, or  psychiatric problems.   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 She has had a hysterectomy.   ALLERGIES:  She is allergic to penicillin and erythromycin.   CURRENT MEDICATIONS:  She currently takes Fosamax 70 mg a week,  Ecotrin,  coated aspirin 325 mg a day.  Calcium 600 mg a day.  Premarin once a day.   FAMILY HISTORY:  Mother is deceased. Father is deceased. Bone cancer in the  family. Father died secondary to pneumonia.   PHYSICAL EXAMINATION:  GENERAL:  [name] is alert and oriented and answers  all questions appropriately. Memory, language, sensory exam, and fund of  knowledge are normal. She is well kempt  and in moderate distress.  EXTREMITIES:  She has good strength in the lower extremities, approximately  5/5. She has intact sensation and normal coordination, though it is  antalgic. Proprioception is slightly decreased in the medial aspect of the  dorsal right foot. Reflexes are intact, 2+ at the knees and ankles. No  clonus. Toes downgoing on plantar simulation.  LUNGS:  Fields clear.  HEART:  Regular  rhythm and rate. There are no murmurs. Pulses are good at  distal feet bilaterally.   She has degenerative spondylolisthesis at L4-5 and lumbar stenosis which is  quite severe at L4-5 and degenerative changes the disk space. There is no  disk herniation. No other abnormalities are appreciated.   PLAN:  [name] will be admitted for lumbar decompression at L4-5. The  risks of this surgery including bleeding, infection, or tear, CSF leak,  recurrence and need for further surgery  were discussed. She understands and  wishes to proceed.                                               Coletta Memos, M.D.    KC/MEDQ  D:  11/13/2001  T:  11/13/2001  Job:  [id number]

## 2010-06-01 NOTE — Op Note (Signed)
   NAME:  [name], [name]                          ACCOUNT NO.:  [id number]   MEDICAL RECORD NO.:  [id number]                   PATIENT TYPE:  INP   LOCATION:  2892                                 FACILITY:  [facility]   PHYSICIAN:  Coletta Memos, M.D.                  DATE OF BIRTH:  08-31-1925   DATE OF PROCEDURE:  11/13/2001  DATE OF DISCHARGE:                                 OPERATIVE REPORT   PREOPERATIVE DIAGNOSIS:  Lumbar stenosis, L4-5.   POSTOPERATIVE DIAGNOSIS:  Lumbar stenosis, L4-5.   PROCEDURE:  L4-5 lumbar decompression via hemilaminectomies.   COMPLICATIONS:  None.   SURGEON:  Coletta Memos, M.D.   ASSISTANT:  Hilda Lias, M.D.   ANESTHESIA:  General endotracheal.   INDICATIONS:  The patient has lumbar stenosis and neurogenic claudication.  This is centered at L4-5.  I recommended and she has agreed to undergo a  lumbar decompression.   DESCRIPTION OF PROCEDURE:  The patient was brought to the operating room,  intubated, placed under general anesthesia without difficulty.  She was  rolled prone onto a Wilson frame and all pressure points were properly  padded.  Her back was prepped, and she was draped in sterile fashion.  I  infiltrated 20 cc of 0.5% lidocaine and 1:200,000 strength epinephrine into  the lumbar region.  I made a skin incision and took this out to the  thoracolumbar fascia.  I then exposed the laminae of what I believed to be  L4 and L5 but they were actually L3 and L4, and using that as a guide I then  exposed the L5 lamina bilaterally.  I then proceeded to perform a  hemilaminectomy using Kerrison punches and the Leksell rongeur.  I was able  to remove what was obviously thickened ligamentum flavum without difficulty.  I decompressed the thecal sac and nerve roots and opened up the L4-5 and L3-  4 neural foramina.  The four L4 nerve roots and L5 nerve roots were well-  decompressed bilaterally.  I then irrigated the wound.  Dr. Jeral Fruit assisted  with decompression and closure.  I then closed the wound in a layered  fashion using Vicryl sutures, Dermabond used for a sterile dressing.                                               Coletta Memos, M.D.    KC/MEDQ  D:  11/13/2001  T:  11/14/2001  Job:  [id number]

## 2011-01-22 DIAGNOSIS — N3020 Other chronic cystitis without hematuria: Secondary | ICD-10-CM | POA: Diagnosis not present

## 2011-01-22 DIAGNOSIS — N3946 Mixed incontinence: Secondary | ICD-10-CM | POA: Diagnosis not present

## 2011-02-07 DIAGNOSIS — E559 Vitamin D deficiency, unspecified: Secondary | ICD-10-CM | POA: Diagnosis not present

## 2011-02-07 DIAGNOSIS — I10 Essential (primary) hypertension: Secondary | ICD-10-CM | POA: Diagnosis not present

## 2011-02-07 DIAGNOSIS — R109 Unspecified abdominal pain: Secondary | ICD-10-CM | POA: Diagnosis not present

## 2011-02-07 DIAGNOSIS — E785 Hyperlipidemia, unspecified: Secondary | ICD-10-CM | POA: Diagnosis not present

## 2011-02-20 DIAGNOSIS — Z1212 Encounter for screening for malignant neoplasm of rectum: Secondary | ICD-10-CM | POA: Diagnosis not present

## 2011-03-01 DIAGNOSIS — R7989 Other specified abnormal findings of blood chemistry: Secondary | ICD-10-CM | POA: Diagnosis not present

## 2011-03-30 DIAGNOSIS — H40019 Open angle with borderline findings, low risk, unspecified eye: Secondary | ICD-10-CM | POA: Diagnosis not present

## 2011-03-30 DIAGNOSIS — Z961 Presence of intraocular lens: Secondary | ICD-10-CM | POA: Diagnosis not present

## 2011-03-30 DIAGNOSIS — H02829 Cysts of unspecified eye, unspecified eyelid: Secondary | ICD-10-CM | POA: Diagnosis not present

## 2011-03-30 DIAGNOSIS — H5200 Hypermetropia, unspecified eye: Secondary | ICD-10-CM | POA: Diagnosis not present

## 2011-05-09 DIAGNOSIS — Z1231 Encounter for screening mammogram for malignant neoplasm of breast: Secondary | ICD-10-CM | POA: Diagnosis not present

## 2011-06-12 DIAGNOSIS — I872 Venous insufficiency (chronic) (peripheral): Secondary | ICD-10-CM | POA: Diagnosis not present

## 2011-06-12 DIAGNOSIS — I10 Essential (primary) hypertension: Secondary | ICD-10-CM | POA: Diagnosis not present

## 2011-06-12 DIAGNOSIS — R5381 Other malaise: Secondary | ICD-10-CM | POA: Diagnosis not present

## 2011-06-12 DIAGNOSIS — E785 Hyperlipidemia, unspecified: Secondary | ICD-10-CM | POA: Diagnosis not present

## 2011-06-12 DIAGNOSIS — E559 Vitamin D deficiency, unspecified: Secondary | ICD-10-CM | POA: Diagnosis not present

## 2011-06-20 DIAGNOSIS — Z1212 Encounter for screening for malignant neoplasm of rectum: Secondary | ICD-10-CM | POA: Diagnosis not present

## 2011-06-21 DIAGNOSIS — R918 Other nonspecific abnormal finding of lung field: Secondary | ICD-10-CM | POA: Diagnosis not present

## 2011-07-09 ENCOUNTER — Other Ambulatory Visit: Payer: Self-pay | Admitting: Neurosurgery

## 2011-07-09 DIAGNOSIS — M545 Low back pain: Secondary | ICD-10-CM

## 2011-07-15 ENCOUNTER — Ambulatory Visit
Admission: RE | Admit: 2011-07-15 | Discharge: 2011-07-15 | Disposition: A | Discharge status: Home / Self Care | Payer: Medicare Other | Source: Ambulatory Visit | Attending: Neurosurgery | Admitting: Neurosurgery

## 2011-07-15 VITALS — BP 201/95 | HR 67

## 2011-07-15 DIAGNOSIS — M5126 Other intervertebral disc displacement, lumbar region: Secondary | ICD-10-CM

## 2011-07-15 DIAGNOSIS — M545 Low back pain: Secondary | ICD-10-CM

## 2011-07-15 DIAGNOSIS — M47817 Spondylosis without myelopathy or radiculopathy, lumbosacral region: Secondary | ICD-10-CM | POA: Diagnosis not present

## 2011-07-15 MED ORDER — IOHEXOL 180 MG/ML  SOLN
1.0000 mL | Freq: Once | INTRAMUSCULAR | Status: AC | PRN
  Administered 2011-07-15: 1 mL via EPIDURAL

## 2011-07-15 MED ORDER — METHYLPREDNISOLONE ACETATE 40 MG/ML INJ SUSP (RADIOLOG
120.0000 mg | Freq: Once | INTRAMUSCULAR | Status: AC
  Administered 2011-07-15: 120 mg via EPIDURAL

## 2011-07-15 NOTE — Discharge Instructions (Signed)

## 2011-07-23 DIAGNOSIS — N3946 Mixed incontinence: Secondary | ICD-10-CM | POA: Diagnosis not present

## 2011-07-23 DIAGNOSIS — N3020 Other chronic cystitis without hematuria: Secondary | ICD-10-CM | POA: Diagnosis not present

## 2011-09-11 DIAGNOSIS — E785 Hyperlipidemia, unspecified: Secondary | ICD-10-CM | POA: Diagnosis not present

## 2011-09-11 DIAGNOSIS — R7989 Other specified abnormal findings of blood chemistry: Secondary | ICD-10-CM | POA: Diagnosis not present

## 2011-09-11 DIAGNOSIS — E559 Vitamin D deficiency, unspecified: Secondary | ICD-10-CM | POA: Diagnosis not present

## 2011-09-11 DIAGNOSIS — I10 Essential (primary) hypertension: Secondary | ICD-10-CM | POA: Diagnosis not present

## 2011-09-11 DIAGNOSIS — R5381 Other malaise: Secondary | ICD-10-CM | POA: Diagnosis not present

## 2011-09-11 DIAGNOSIS — M810 Age-related osteoporosis without current pathological fracture: Secondary | ICD-10-CM | POA: Diagnosis not present

## 2011-09-11 DIAGNOSIS — M109 Gout, unspecified: Secondary | ICD-10-CM | POA: Diagnosis not present

## 2011-09-26 DIAGNOSIS — M4310 Spondylolisthesis, site unspecified: Secondary | ICD-10-CM | POA: Diagnosis not present

## 2011-10-07 ENCOUNTER — Other Ambulatory Visit: Payer: Self-pay | Admitting: Neurosurgery

## 2011-10-07 DIAGNOSIS — M545 Low back pain: Secondary | ICD-10-CM

## 2011-10-10 ENCOUNTER — Ambulatory Visit
Admission: RE | Admit: 2011-10-10 | Discharge: 2011-10-10 | Disposition: A | Discharge status: Home / Self Care | Payer: Medicare Other | Source: Ambulatory Visit | Attending: Neurosurgery | Admitting: Neurosurgery

## 2011-10-10 VITALS — BP 163/80 | HR 70

## 2011-10-10 DIAGNOSIS — M545 Low back pain: Secondary | ICD-10-CM

## 2011-10-10 DIAGNOSIS — M47817 Spondylosis without myelopathy or radiculopathy, lumbosacral region: Secondary | ICD-10-CM | POA: Diagnosis not present

## 2011-10-10 MED ORDER — METHYLPREDNISOLONE ACETATE 40 MG/ML INJ SUSP (RADIOLOG
120.0000 mg | Freq: Once | INTRAMUSCULAR | Status: AC
  Administered 2011-10-10: 120 mg via EPIDURAL

## 2011-10-10 MED ORDER — IOHEXOL 180 MG/ML  SOLN
1.0000 mL | Freq: Once | INTRAMUSCULAR | Status: AC | PRN
  Administered 2011-10-10: 1 mL via EPIDURAL

## 2011-10-21 DIAGNOSIS — Z124 Encounter for screening for malignant neoplasm of cervix: Secondary | ICD-10-CM | POA: Diagnosis not present

## 2011-10-21 DIAGNOSIS — Z23 Encounter for immunization: Secondary | ICD-10-CM | POA: Diagnosis not present

## 2011-12-11 DIAGNOSIS — E559 Vitamin D deficiency, unspecified: Secondary | ICD-10-CM | POA: Diagnosis not present

## 2011-12-11 DIAGNOSIS — N3090 Cystitis, unspecified without hematuria: Secondary | ICD-10-CM | POA: Diagnosis not present

## 2011-12-11 DIAGNOSIS — E785 Hyperlipidemia, unspecified: Secondary | ICD-10-CM | POA: Diagnosis not present

## 2011-12-11 DIAGNOSIS — I10 Essential (primary) hypertension: Secondary | ICD-10-CM | POA: Diagnosis not present

## 2012-03-26 DIAGNOSIS — I10 Essential (primary) hypertension: Secondary | ICD-10-CM | POA: Diagnosis not present

## 2012-03-26 DIAGNOSIS — E538 Deficiency of other specified B group vitamins: Secondary | ICD-10-CM | POA: Diagnosis not present

## 2012-03-26 DIAGNOSIS — J309 Allergic rhinitis, unspecified: Secondary | ICD-10-CM | POA: Diagnosis not present

## 2012-03-26 DIAGNOSIS — R7989 Other specified abnormal findings of blood chemistry: Secondary | ICD-10-CM | POA: Diagnosis not present

## 2012-05-06 ENCOUNTER — Other Ambulatory Visit: Payer: Self-pay | Admitting: Neurosurgery

## 2012-05-06 DIAGNOSIS — M545 Low back pain: Secondary | ICD-10-CM

## 2012-05-11 ENCOUNTER — Ambulatory Visit
Admission: RE | Admit: 2012-05-11 | Discharge: 2012-05-11 | Disposition: A | Discharge status: Home / Self Care | Payer: Medicare Other | Source: Ambulatory Visit | Attending: Neurosurgery | Admitting: Neurosurgery

## 2012-05-11 DIAGNOSIS — M5126 Other intervertebral disc displacement, lumbar region: Secondary | ICD-10-CM | POA: Diagnosis not present

## 2012-05-11 DIAGNOSIS — M545 Low back pain: Secondary | ICD-10-CM

## 2012-05-11 MED ORDER — METHYLPREDNISOLONE ACETATE 40 MG/ML INJ SUSP (RADIOLOG
120.0000 mg | Freq: Once | INTRAMUSCULAR | Status: AC
  Administered 2012-05-11: 120 mg via EPIDURAL

## 2012-05-11 MED ORDER — IOHEXOL 180 MG/ML  SOLN
1.0000 mL | Freq: Once | INTRAMUSCULAR | Status: AC | PRN
  Administered 2012-05-11: 1 mL via EPIDURAL

## 2012-05-25 ENCOUNTER — Other Ambulatory Visit: Payer: Self-pay | Admitting: Neurosurgery

## 2012-05-25 DIAGNOSIS — M549 Dorsalgia, unspecified: Secondary | ICD-10-CM

## 2012-05-27 ENCOUNTER — Ambulatory Visit
Admission: RE | Admit: 2012-05-27 | Discharge: 2012-05-27 | Disposition: A | Discharge status: Home / Self Care | Payer: Medicare Other | Source: Ambulatory Visit | Attending: Neurosurgery | Admitting: Neurosurgery

## 2012-05-27 DIAGNOSIS — M549 Dorsalgia, unspecified: Secondary | ICD-10-CM

## 2012-05-27 DIAGNOSIS — M5126 Other intervertebral disc displacement, lumbar region: Secondary | ICD-10-CM | POA: Diagnosis not present

## 2012-05-27 MED ORDER — METHYLPREDNISOLONE ACETATE 40 MG/ML INJ SUSP (RADIOLOG
120.0000 mg | Freq: Once | INTRAMUSCULAR | Status: AC
  Administered 2012-05-27: 120 mg via EPIDURAL

## 2012-05-27 MED ORDER — IOHEXOL 180 MG/ML  SOLN
1.0000 mL | Freq: Once | INTRAMUSCULAR | Status: AC | PRN
  Administered 2012-05-27: 1 mL via EPIDURAL

## 2012-06-03 DIAGNOSIS — Z1231 Encounter for screening mammogram for malignant neoplasm of breast: Secondary | ICD-10-CM | POA: Diagnosis not present

## 2012-06-18 ENCOUNTER — Other Ambulatory Visit: Payer: Self-pay | Admitting: *Deleted

## 2012-06-18 DIAGNOSIS — R918 Other nonspecific abnormal finding of lung field: Secondary | ICD-10-CM

## 2012-06-30 DIAGNOSIS — R911 Solitary pulmonary nodule: Secondary | ICD-10-CM | POA: Diagnosis not present

## 2012-06-30 DIAGNOSIS — J479 Bronchiectasis, uncomplicated: Secondary | ICD-10-CM | POA: Diagnosis not present

## 2012-07-16 ENCOUNTER — Encounter: Payer: Self-pay | Admitting: Family Medicine

## 2012-07-16 ENCOUNTER — Ambulatory Visit (INDEPENDENT_AMBULATORY_CARE_PROVIDER_SITE_OTHER): Payer: Medicare Other | Admitting: Family Medicine

## 2012-07-16 VITALS — BP 124/66 | HR 68 | Temp 98.6°F | Ht 59.0 in | Wt 148.8 lb

## 2012-07-16 DIAGNOSIS — R5383 Other fatigue: Secondary | ICD-10-CM | POA: Diagnosis not present

## 2012-07-16 DIAGNOSIS — E785 Hyperlipidemia, unspecified: Secondary | ICD-10-CM

## 2012-07-16 DIAGNOSIS — R5381 Other malaise: Secondary | ICD-10-CM | POA: Diagnosis not present

## 2012-07-16 DIAGNOSIS — D531 Other megaloblastic anemias, not elsewhere classified: Secondary | ICD-10-CM | POA: Insufficient documentation

## 2012-07-16 DIAGNOSIS — M545 Other chronic pain: Secondary | ICD-10-CM | POA: Insufficient documentation

## 2012-07-16 DIAGNOSIS — G8929 Other chronic pain: Secondary | ICD-10-CM | POA: Insufficient documentation

## 2012-07-16 DIAGNOSIS — R918 Other nonspecific abnormal finding of lung field: Secondary | ICD-10-CM | POA: Diagnosis not present

## 2012-07-16 DIAGNOSIS — E559 Vitamin D deficiency, unspecified: Secondary | ICD-10-CM | POA: Diagnosis not present

## 2012-07-16 LAB — HEPATIC FUNCTION PANEL
Albumin: 3.8 g/dL (ref 3.5–5.2)
Total Bilirubin: 0.5 mg/dL (ref 0.3–1.2)

## 2012-07-16 LAB — THYROID PANEL WITH TSH
Free Thyroxine Index: 3.7 (ref 1.0–3.9)
T4, Total: 11.3 ug/dL (ref 5.0–12.5)
TSH: 0.358 u[IU]/mL (ref 0.350–4.500)

## 2012-07-16 LAB — POCT CBC
Granulocyte percent: 69.6 %G (ref 37–80)
MCV: 84.3 fL (ref 80–97)
MPV: 8.4 fL (ref 0–99.8)
Platelet Count, POC: 219 10*3/uL (ref 142–424)
RBC: 4.5 M/uL (ref 4.04–5.48)

## 2012-07-16 NOTE — Progress Notes (Signed)
  Subjective:    Patient ID: [name], female    DOB: [birth year], 79 y.o.   MRN: [id number]  HPI Patient returns to clinic today for followup of chronic medical problems which include hyperlipidemia, B12 deficiency, and chronic low back pain. See also the review of systems. She has some seasonal allergies, fatigue, and some urinary frequency and urgency. She is due an FOBT. Her outside blood pressures are reviewed and they are all good with normal pulse rates . She has had a couple of lumbar injections by Dr. Franky Macho and she usually calls him when her pain recurs.    Review of Systems  Constitutional: Positive for fatigue.  HENT: Negative.   Eyes: Positive for discharge (watery due to allergies, improving).  Respiratory: Negative.   Cardiovascular: Positive for leg swelling (lower legs).  Gastrointestinal: Positive for abdominal pain (slight) and diarrhea (loose stool after eating). Negative for nausea and vomiting.  Genitourinary: Positive for urgency (some incontinence) and frequency. Negative for dysuria.  Musculoskeletal: Positive for back pain (LBP).  Skin: Negative.   Allergic/Immunologic: Positive for environmental allergies (seasonal).  Psychiatric/Behavioral: Positive for sleep disturbance (wakes during the night).       Objective:   Physical Exam BP 124/66  Pulse 68  Temp(Src) 98.6 F (37 C) (Oral)  Ht 4\' 11"  (1.499 m)  Wt 148 lb 12.8 oz (67.495 kg)  BMI 30.04 kg/m2  The patient appeared well nourished and normally developed, alert and oriented to time and place. Speech, behavior and judgement appear normal. Vital signs as documented.  Head exam is unremarkable. No scleral icterus or pallor noted. Nose and throat were within normal limit Neck is without jugular venous distension, thyromegally, or carotid bruits. Carotid upstrokes are brisk bilaterally. No cervical adenopathy. Lungs are clear anteriorly and posteriorly to auscultation, a few wheezes were heard  initially but with coughing he seem to clear. Normal respiratory effort. Cardiac exam reveals regular rate and rhythm at 72 per minute. First and second heart sounds normal.  No murmurs, rubs or gallops.  Abdominal exam reveals normal bowl sounds, no masses, no organomegaly and no aortic enlargement. No inguinal adenopathy. Mild obesity but no abdominal tenderness. Extremities are nonedematous and both femoral and pedal pulses are normal. Skin without pallor or jaundice.  Warm and dry, without rash. Neurologic exam reveals normal deep tendon reflexes and normal sensation.          Assessment & Plan:  1. Fatigue - POCT CBC; Standing - Thyroid Panel With TSH - Vitamin D 25 hydroxy; Standing - BASIC METABOLIC PANEL WITH GFR; Standing - POCT CBC - Vitamin D 25 hydroxy - BASIC METABOLIC PANEL WITH GFR  2. Hyperlipemia - NMR Lipoprofile with Lipids; Standing - Hepatic function panel; Standing - NMR Lipoprofile with Lipids - Hepatic function panel  3. Pulmonary nodules - CT Chest Wo Contrast  Patient Instructions  Fall precautions discussed Continue current meds and therapeutic lifestyle changes Drink plenty of fluids Pay more attention to milk cheese ice cream dairy products and caffeine

## 2012-07-16 NOTE — Patient Instructions (Addendum)
Fall precautions discussed Continue current meds and therapeutic lifestyle changes Drink plenty of fluids Pay more attention to milk cheese ice cream dairy products and caffeine

## 2012-07-17 LAB — BASIC METABOLIC PANEL WITH GFR
Calcium: 9.2 mg/dL (ref 8.4–10.5)
Creat: 0.67 mg/dL (ref 0.50–1.10)
GFR, Est African American: >89 mL/min
GFR, Est Non African American: 80 mL/min

## 2012-07-20 LAB — NMR LIPOPROFILE WITH LIPIDS
Cholesterol, Total: 158 mg/dL (ref <200)
HDL Size: 9.4 nm (ref >=9.2)
LDL (calc): 74 mg/dL (ref <100)
LDL Particle Number: 1123 nmol/L — ABNORMAL HIGH (ref <1000)
LDL Size: 20.9 nm (ref >20.5)
LP-IR Score: 42 (ref <=45)
Small LDL Particle Number: 701 nmol/L — ABNORMAL HIGH (ref <=527)
VLDL Size: 46.2 nm (ref <=46.6)

## 2012-07-27 DIAGNOSIS — L820 Inflamed seborrheic keratosis: Secondary | ICD-10-CM | POA: Diagnosis not present

## 2012-07-27 DIAGNOSIS — L570 Actinic keratosis: Secondary | ICD-10-CM | POA: Diagnosis not present

## 2012-07-27 DIAGNOSIS — D485 Neoplasm of uncertain behavior of skin: Secondary | ICD-10-CM | POA: Diagnosis not present

## 2012-07-27 DIAGNOSIS — Z85828 Personal history of other malignant neoplasm of skin: Secondary | ICD-10-CM | POA: Diagnosis not present

## 2012-07-28 ENCOUNTER — Telehealth: Payer: Self-pay | Admitting: Family Medicine

## 2012-07-29 ENCOUNTER — Telehealth: Payer: Self-pay | Admitting: *Deleted

## 2012-07-29 NOTE — Telephone Encounter (Signed)
Message copied by Bearl Mulberry on Wed Jul 29, 2012  7:22 PM ------      Message from: Ernestina Penna      Created: Thu Jul 16, 2012 12:56 PM       Normal CBC including hemoglobin WBC and platelets       ------

## 2012-07-29 NOTE — Telephone Encounter (Signed)
Pt notified of results

## 2012-08-04 ENCOUNTER — Other Ambulatory Visit (INDEPENDENT_AMBULATORY_CARE_PROVIDER_SITE_OTHER): Payer: Medicare Other

## 2012-08-04 DIAGNOSIS — Z1212 Encounter for screening for malignant neoplasm of rectum: Secondary | ICD-10-CM | POA: Diagnosis not present

## 2012-08-05 LAB — FECAL OCCULT BLOOD, IMMUNOCHEMICAL: Fecal Occult Blood: POSITIVE — AB

## 2012-09-03 ENCOUNTER — Other Ambulatory Visit (INDEPENDENT_AMBULATORY_CARE_PROVIDER_SITE_OTHER): Payer: Medicare Other

## 2012-09-03 DIAGNOSIS — Z1212 Encounter for screening for malignant neoplasm of rectum: Secondary | ICD-10-CM | POA: Diagnosis not present

## 2012-09-22 ENCOUNTER — Telehealth: Payer: Self-pay | Admitting: Family Medicine

## 2012-09-23 NOTE — Telephone Encounter (Signed)
PT AWARE OF NEG RESULT

## 2012-09-24 ENCOUNTER — Encounter: Payer: Self-pay | Admitting: *Deleted

## 2012-09-30 ENCOUNTER — Other Ambulatory Visit: Payer: Self-pay | Admitting: *Deleted

## 2012-09-30 MED ORDER — ALENDRONATE SODIUM 35 MG PO TABS: 35.0000 mg | ORAL_TABLET | ORAL | Status: DC

## 2012-09-30 MED ORDER — DOXYCYCLINE HYCLATE 100 MG PO CAPS: 100.0000 mg | ORAL_CAPSULE | Freq: Every day | ORAL | Status: DC

## 2012-09-30 MED ORDER — SIMVASTATIN 80 MG PO TABS: 80.0000 mg | ORAL_TABLET | Freq: Every day | ORAL | Status: DC

## 2012-09-30 NOTE — Telephone Encounter (Signed)
LAST OV 7/14.

## 2012-09-30 NOTE — Telephone Encounter (Signed)
LAST OV 7/14. LAST LIPIDS 7/14. ZOCOR IN EPIC HAD 80MG (TAKE 40MG ). PLEASE REVIEW FOR REFILLING.

## 2012-10-28 ENCOUNTER — Ambulatory Visit: Payer: Medicare Other

## 2012-10-28 ENCOUNTER — Ambulatory Visit (INDEPENDENT_AMBULATORY_CARE_PROVIDER_SITE_OTHER): Payer: Medicare Other | Admitting: *Deleted

## 2012-10-28 DIAGNOSIS — Z23 Encounter for immunization: Secondary | ICD-10-CM | POA: Diagnosis not present

## 2012-11-03 ENCOUNTER — Other Ambulatory Visit: Payer: Self-pay

## 2012-11-03 MED ORDER — ALENDRONATE SODIUM 35 MG PO TABS: 35.0000 mg | ORAL_TABLET | ORAL | Status: DC

## 2012-11-24 DIAGNOSIS — M48061 Spinal stenosis, lumbar region without neurogenic claudication: Secondary | ICD-10-CM | POA: Diagnosis not present

## 2012-11-24 DIAGNOSIS — M4310 Spondylolisthesis, site unspecified: Secondary | ICD-10-CM | POA: Diagnosis not present

## 2012-12-24 ENCOUNTER — Ambulatory Visit: Payer: Medicare Other | Admitting: Family Medicine

## 2012-12-31 ENCOUNTER — Ambulatory Visit (INDEPENDENT_AMBULATORY_CARE_PROVIDER_SITE_OTHER): Payer: Medicare Other | Admitting: Family Medicine

## 2012-12-31 ENCOUNTER — Encounter: Payer: Self-pay | Admitting: Family Medicine

## 2012-12-31 VITALS — BP 154/67 | HR 68 | Temp 97.7°F | Ht 59.0 in | Wt 147.0 lb

## 2012-12-31 DIAGNOSIS — M48061 Spinal stenosis, lumbar region without neurogenic claudication: Secondary | ICD-10-CM | POA: Insufficient documentation

## 2012-12-31 DIAGNOSIS — M810 Age-related osteoporosis without current pathological fracture: Secondary | ICD-10-CM

## 2012-12-31 DIAGNOSIS — Z23 Encounter for immunization: Secondary | ICD-10-CM | POA: Diagnosis not present

## 2012-12-31 DIAGNOSIS — E785 Hyperlipidemia, unspecified: Secondary | ICD-10-CM | POA: Diagnosis not present

## 2012-12-31 DIAGNOSIS — R300 Dysuria: Secondary | ICD-10-CM

## 2012-12-31 DIAGNOSIS — E559 Vitamin D deficiency, unspecified: Secondary | ICD-10-CM

## 2012-12-31 DIAGNOSIS — J309 Allergic rhinitis, unspecified: Secondary | ICD-10-CM

## 2012-12-31 LAB — POCT UA - MICROSCOPIC ONLY

## 2012-12-31 LAB — POCT URINALYSIS DIPSTICK
Urobilinogen, UA: NEGATIVE
pH, UA: 5

## 2012-12-31 LAB — POCT CBC
HCT, POC: 43.7 % (ref 37.7–47.9)
MCH, POC: 27.6 pg (ref 27–31.2)
MCHC: 32.4 g/dL (ref 31.8–35.4)
POC Granulocyte: 4.8 (ref 2–6.9)
POC LYMPH PERCENT: 22.6 %L (ref 10–50)
RDW, POC: 13.2 %
WBC: 6.7 10*3/uL (ref 4.6–10.2)

## 2012-12-31 NOTE — Patient Instructions (Addendum)
Continue current medications. Continue good therapeutic lifestyle changes which include good diet and exercise. Fall precautions discussed with patient. Schedule your flu vaccine if you haven't had it yet If you are over 77 years old - you may need Prevnar 13 or the adult Pneumonia vaccine. The Prevnar shot may make your arm sore Try cortisone 10, a small amount on a Q-tip for the itching ear canals 2- 3 times weekly if needed Try Benadryl elixir 1-2 teaspoons once or twice daily as needed for allergy  Continue to drink plenty of fluids Use a cool mist humidifier in her bedroom at nighttime Use saline nose spray during the day if needed for congestion

## 2012-12-31 NOTE — Addendum Note (Signed)
Addended by: Prescott Gum on: 12/31/2012 02:28 PM   Modules accepted: Orders

## 2012-12-31 NOTE — Addendum Note (Signed)
Addended by: Magdalene River on: 12/31/2012 03:14 PM   Modules accepted: Orders

## 2012-12-31 NOTE — Progress Notes (Signed)
Subjective:    Patient ID: [name], female    DOB: 1925-06-10, 79 y.o.   MRN: [id number]  HPI Pt here for follow up and management of chronic medical problems. Patient does complain of her ear is itching at times. She sees the neurosurgeon regularly because of her spinal stenosis and he periodically has her back injected with steroids. She has not had any injections recently. Due to financial reasons she was unable to continue to take her antihistamine. I informed her about Benadryl elixir and she will try some of this being aware that it might make her drowsy. She continues to try to be careful and prevent herself from falling.      Patient Active Problem List   Diagnosis Date Noted  . Hyperlipidemia 07/16/2012  . Chronic low back pain 07/16/2012  . Vitamin B12 deficient megaloblastic anemia 07/16/2012   Outpatient Encounter Prescriptions as of 12/31/2012  Medication Sig  . alendronate (FOSAMAX) 35 MG tablet Take 1 tablet (35 mg total) by mouth every 7 (seven) days.  . Cholecalciferol (VITAMIN D3) 2000 UNITS TABS Take 1 tablet by mouth daily.    Marland Kitchen doxycycline (VIBRAMYCIN) 100 MG capsule Take 1 capsule (100 mg total) by mouth daily.  . Multiple Vitamins-Minerals (CENTRUM SILVER) tablet Take 1 tablet by mouth daily.    . simvastatin (ZOCOR) 80 MG tablet Take 1 tablet (80 mg total) by mouth at bedtime.    Review of Systems  Constitutional: Negative.   HENT: Positive for postnasal drip.   Eyes: Positive for discharge (watery- without allergy pill).  Respiratory: Negative.   Cardiovascular: Negative.   Gastrointestinal: Negative.   Endocrine: Negative.   Genitourinary: Negative.        Still having urine trouble - on doxycycline qd  Musculoskeletal: Positive for arthralgias (right arm pain from shoulder to elbow).  Skin: Negative.   Allergic/Immunologic: Negative.   Neurological: Negative.   Hematological: Negative.   Psychiatric/Behavioral: Negative.          Objective:   Physical Exam  Nursing note and vitals reviewed. Constitutional: She is oriented to person, place, and time. She appears well-developed and well-nourished. No distress.  Somewhat elderly and kyphotic but alert female in no acute distress  HENT:  Head: Normocephalic and atraumatic.  Right Ear: External ear normal.  Left Ear: External ear normal.  Mouth/Throat: Oropharynx is clear and moist.  Nose or pallor and congestion  Eyes: Conjunctivae and EOM are normal. Pupils are equal, round, and reactive to light. Right eye exhibits no discharge. Left eye exhibits no discharge. No scleral icterus.  Neck: Normal range of motion. Neck supple. No JVD present. No thyromegaly present.  Cardiovascular: Normal rate, regular rhythm, normal heart sounds and intact distal pulses.  Exam reveals no gallop and no friction rub.   No murmur heard. At 72 per minute  Pulmonary/Chest: Effort normal and breath sounds normal. No respiratory distress. She has no wheezes. She has no rales. She exhibits no tenderness.  Prominent kyphosis  Abdominal: Soft. Bowel sounds are normal. She exhibits no mass. There is no tenderness. There is no rebound and no guarding.  Musculoskeletal: Normal range of motion. She exhibits no edema and no tenderness.  Good mobility of lower extremities Limited hip abduction bilaterally. Patient was able to get up on the table with minimal assistance  Lymphadenopathy:    She has no cervical adenopathy.  Neurological: She is alert and oriented to person, place, and time. She has normal reflexes. No cranial nerve  deficit.  Skin: Skin is warm and dry.  Psychiatric: She has a normal mood and affect. Her behavior is normal. Judgment and thought content normal.  Positive affect   BP 154/67  Pulse 68  Temp(Src) 97.7 F (36.5 C) (Oral)  Ht 4\' 11"  (1.499 m)  Wt 147 lb (66.679 kg)  BMI 29.67 kg/m2        Assessment & Plan:   1. Hyperlipidemia - BMP8+EGFR - Hepatic function  panel - NMR, lipoprofile  2. Dysuria - POCT urinalysis dipstick - POCT UA - Microscopic Only - POCT CBC  3. Vitamin D deficiency - Vit D  25 hydroxy (rtn osteoporosis monitoring)  4. Osteoporosis  5. Spinal stenosis of lumbar region   6. Allergic rhinitis  Orders Placed This Encounter  Procedures  . BMP8+EGFR  . Hepatic function panel  . NMR, lipoprofile  . Vit D  25 hydroxy (rtn osteoporosis monitoring)  . POCT urinalysis dipstick  . POCT UA - Microscopic Only  . POCT CBC     Patient Instructions  Continue current medications. Continue good therapeutic lifestyle changes which include good diet and exercise. Fall precautions discussed with patient. Schedule your flu vaccine if you haven't had it yet If you are over 69 years old - you may need Prevnar 13 or the adult Pneumonia vaccine. The Prevnar shot may make your arm sore Try cortisone 10, a small amount on a Q-tip for the itching ear canals 2- 3 times weekly if needed Try Benadryl elixir 1-2 teaspoons once or twice daily as needed for allergy  Continue to drink plenty of fluids Use a cool mist humidifier in her bedroom at nighttime Use saline nose spray during the day if needed for congestion   Nyra Capes MD

## 2013-01-01 ENCOUNTER — Ambulatory Visit (INDEPENDENT_AMBULATORY_CARE_PROVIDER_SITE_OTHER): Payer: Medicare Other | Admitting: Physician Assistant

## 2013-01-01 ENCOUNTER — Encounter: Payer: Self-pay | Admitting: Physician Assistant

## 2013-01-01 VITALS — BP 132/67 | HR 78 | Temp 99.2°F | Wt 147.2 lb

## 2013-01-01 DIAGNOSIS — R300 Dysuria: Secondary | ICD-10-CM | POA: Diagnosis not present

## 2013-01-01 DIAGNOSIS — R509 Fever, unspecified: Secondary | ICD-10-CM

## 2013-01-01 DIAGNOSIS — N3020 Other chronic cystitis without hematuria: Secondary | ICD-10-CM | POA: Diagnosis not present

## 2013-01-01 DIAGNOSIS — R110 Nausea: Secondary | ICD-10-CM | POA: Diagnosis not present

## 2013-01-01 LAB — POCT URINALYSIS DIPSTICK
Bilirubin, UA: NEGATIVE
Glucose, UA: NEGATIVE
Ketones, UA: NEGATIVE
Spec Grav, UA: 1.025

## 2013-01-01 LAB — POCT UA - MICROSCOPIC ONLY
Crystals, Ur, HPF, POC: NEGATIVE
Mucus, UA: NEGATIVE

## 2013-01-01 MED ORDER — FLUCONAZOLE 150 MG PO TABS: 150.0000 mg | ORAL_TABLET | Freq: Every day | ORAL | Status: DC

## 2013-01-01 MED ORDER — NITROFURANTOIN MONOHYD MACRO 100 MG PO CAPS: 100.0000 mg | ORAL_CAPSULE | Freq: Two times a day (BID) | ORAL | Status: DC

## 2013-01-01 NOTE — Progress Notes (Signed)
   Subjective:    Patient ID: [name], female    DOB: [birth year], 79 y.o.   MRN: [id number]  HPI 79 y/o female presents with c/o nausea, fever of 102F and dysuria since last night. Has chronic UTI's and has been taking Doxycycline 100mg  q day since 2010. Was seen yesterday in office for regular checkup with no symptoms. Received Pneumonia vaccine.     Review of Systems  Constitutional: Positive for fever (102 Flast night. 32F this am), chills and appetite change (reduced). Negative for diaphoresis, fatigue and unexpected weight change.  HENT: Negative.   Respiratory: Negative.   Cardiovascular: Negative.   Gastrointestinal: Negative.   Genitourinary: Positive for dysuria (pain and tingling with urination. Started last night) and frequency. Negative for urgency, hematuria, flank pain, vaginal bleeding, enuresis, difficulty urinating, genital sores, vaginal pain, menstrual problem, pelvic pain and dyspareunia.  Musculoskeletal: Negative.   All other systems reviewed and are negative.       Objective:   Physical Exam  Vitals reviewed. Cardiovascular: Normal rate, regular rhythm and normal heart sounds.  Exam reveals no gallop and no friction rub.   No murmur heard. Pulmonary/Chest: Effort normal and breath sounds normal.  Abdominal: Soft.  Genitourinary:  U/A indicates elevated 12-15 WBC and increased RBC.   Neurological: She is alert.  Skin: Skin is warm. There is erythema (minimal eyrthema surrounding injection site from vaccine. Appears normal. Borders marked).  Psychiatric: She has a normal mood and affect. Her behavior is normal. Thought content normal.          Assessment & Plan:  1 UTI: stop chronic prophylactic use of Doxycycline and tx w/ Macrobid BID x 7 days. Have patient f/u in 1 week with Dr. Christell Constant to reassess and determine preferred prophylactic antibiotic to use since patient has been taking Doxycycline for several years s/p tx w/ Bactrim prior to that.  Increase fluids. Gave script for Diflucan due to patient and family history of Vaginal Candidiasis. Instructed patient to stop taking Statin x 1 week if she takes diflucan. Tylenol or motrin for fever.

## 2013-01-01 NOTE — Patient Instructions (Signed)
Drink plenty of fluids - gatorade, propel or water. Take antibiotic with food to prevent nausea. F/U with Dr. Christell Constant in 7 days for reassessment.

## 2013-01-02 LAB — BMP8+EGFR
BUN/Creatinine Ratio: 11 (ref 11–26)
Chloride: 101 mmol/L (ref 97–108)
GFR calc Af Amer: 78 mL/min/{1.73_m2} (ref >59)
Glucose: 91 mg/dL (ref 65–99)
Potassium: 3.9 mmol/L (ref 3.5–5.2)
Sodium: 143 mmol/L (ref 134–144)

## 2013-01-02 LAB — HEPATIC FUNCTION PANEL
ALT: 9 IU/L (ref 0–32)
AST: 25 IU/L (ref 0–40)
Alkaline Phosphatase: 91 IU/L (ref 39–117)
Bilirubin, Direct: 0.13 mg/dL (ref 0.00–0.40)
Total Protein: 6.6 g/dL (ref 6.0–8.5)

## 2013-01-02 LAB — NMR, LIPOPROFILE
LDL Particle Number: 931 nmol/L (ref <1000)
LDL Size: 21.7 nm (ref >20.5)
LDLC SERPL CALC-MCNC: 74 mg/dL (ref <100)
LP-IR Score: 35 (ref <=45)

## 2013-01-03 LAB — URINE CULTURE

## 2013-01-08 DIAGNOSIS — N3020 Other chronic cystitis without hematuria: Secondary | ICD-10-CM | POA: Insufficient documentation

## 2013-01-11 ENCOUNTER — Ambulatory Visit (INDEPENDENT_AMBULATORY_CARE_PROVIDER_SITE_OTHER): Payer: Medicare Other | Admitting: Family Medicine

## 2013-01-11 ENCOUNTER — Encounter: Payer: Self-pay | Admitting: Family Medicine

## 2013-01-11 VITALS — BP 162/78 | HR 66 | Temp 98.1°F | Ht 59.0 in | Wt 149.0 lb

## 2013-01-11 DIAGNOSIS — N390 Urinary tract infection, site not specified: Secondary | ICD-10-CM | POA: Diagnosis not present

## 2013-01-11 LAB — POCT UA - MICROSCOPIC ONLY
Bacteria, U Microscopic: NEGATIVE
Casts, Ur, LPF, POC: NEGATIVE
Crystals, Ur, HPF, POC: NEGATIVE
Mucus, UA: NEGATIVE
Yeast, UA: NEGATIVE

## 2013-01-11 LAB — POCT URINALYSIS DIPSTICK
Bilirubin, UA: NEGATIVE
Blood, UA: NEGATIVE
Ketones, UA: NEGATIVE
Nitrite, UA: NEGATIVE
Urobilinogen, UA: NEGATIVE
pH, UA: 6

## 2013-01-11 MED ORDER — NITROFURANTOIN MACROCRYSTAL 50 MG PO CAPS: ORAL_CAPSULE | ORAL | Status: DC

## 2013-01-11 NOTE — Progress Notes (Signed)
Subjective:    Patient ID: [name], female    DOB: [birth year], 79 y.o.   MRN: [id number]  HPI Patient here today for follow up of uti, nausea, and fever. Patient is feeling much better today. She is feeling much better and much less burning with voiding. Comes to the visit with her daughter today.     Patient Active Problem List   Diagnosis Date Noted  . Chronic cystitis 01/08/2013  . Vitamin D deficiency 12/31/2012  . Osteoporosis 12/31/2012  . Spinal stenosis of lumbar region 12/31/2012  . Hyperlipidemia 07/16/2012  . Chronic low back pain 07/16/2012  . Vitamin B12 deficient megaloblastic anemia 07/16/2012   Outpatient Encounter Prescriptions as of 01/11/2013  Medication Sig  . alendronate (FOSAMAX) 35 MG tablet Take 1 tablet (35 mg total) by mouth every 7 (seven) days.  . Cholecalciferol (VITAMIN D3) 2000 UNITS TABS Take 1 tablet by mouth daily.    . fluconazole (DIFLUCAN) 150 MG tablet Take 1 tablet (150 mg total) by mouth daily.  . Multiple Vitamins-Minerals (CENTRUM SILVER) tablet Take 1 tablet by mouth daily.    . simvastatin (ZOCOR) 80 MG tablet Take 1 tablet (80 mg total) by mouth at bedtime.  . [DISCONTINUED] doxycycline (VIBRAMYCIN) 100 MG capsule Take 1 capsule (100 mg total) by mouth daily.  . [DISCONTINUED] nitrofurantoin, macrocrystal-monohydrate, (MACROBID) 100 MG capsule Take 1 capsule (100 mg total) by mouth 2 (two) times daily.    Review of Systems  HENT: Negative.   Eyes: Negative.   Respiratory: Negative.   Cardiovascular: Negative.   Gastrointestinal: Negative.   Endocrine: Negative.   Genitourinary: Negative.   Musculoskeletal: Negative.   Skin: Negative.   Allergic/Immunologic: Negative.   Neurological: Positive for weakness ("hasnt got all of her strength back yet").  Hematological: Negative.   Psychiatric/Behavioral: Negative.        Objective:   Physical Exam  Nursing note and vitals reviewed. Constitutional: She is oriented to  person, place, and time. She appears well-developed and well-nourished.  HENT:  Head: Normocephalic.  Eyes: Conjunctivae and EOM are normal. Pupils are equal, round, and reactive to light. Right eye exhibits no discharge. Left eye exhibits no discharge.  Neck: Normal range of motion. Neck supple.  Abdominal: Soft. She exhibits no distension. There is tenderness. There is guarding.  Musculoskeletal: Normal range of motion.  Neurological: She is alert and oriented to person, place, and time.  Skin: Skin is warm and dry. No rash noted.  Psychiatric: She has a normal mood and affect. Her behavior is normal. Judgment and thought content normal.   BP 162/78  Pulse 66  Temp(Src) 98.1 F (36.7 C) (Oral)  Ht 4\' 11"  (1.499 m)  Wt 149 lb (67.586 kg)  BMI 30.08 kg/m2  Results for orders placed in visit on 01/11/13  POCT URINALYSIS DIPSTICK      Result Value Range   Color, UA yellow     Clarity, UA clear     Glucose, UA neg     Bilirubin, UA neg     Ketones, UA neg     Spec Grav, UA 1.010     Blood, UA neg     pH, UA 6.0     Protein, UA neg     Urobilinogen, UA negative     Nitrite, UA neg     Leukocytes, UA moderate (2+)    POCT UA - MICROSCOPIC ONLY      Result Value Range   WBC, Ur, HPF, POC  10-15     RBC, urine, microscopic neg     Bacteria, U Microscopic neg     Mucus, UA neg     Epithelial cells, urine per micros occ     Crystals, Ur, HPF, POC neg     Casts, Ur, LPF, POC neg     Yeast, UA neg           Assessment & Plan:   1. UTI (urinary tract infection) - POCT urinalysis dipstick - POCT UA - Microscopic Only - Urine culture  Patient Instructions  Always drink plenty of fluids Try to be aware of when you're having symptoms and let your family know about it so this can be taken care of as soon as possible  Continue Macrodantin 50 mg until visit can be arranged with urology  Nyra Capes MD

## 2013-01-11 NOTE — Patient Instructions (Signed)
Always drink plenty of fluids Try to be aware of when you're having symptoms and let your family know about it so this can be taken care of as soon as possible

## 2013-01-11 NOTE — Addendum Note (Signed)
Addended by: Gwenith Daily on: 01/11/2013 02:54 PM   Modules accepted: Orders

## 2013-01-12 ENCOUNTER — Ambulatory Visit: Payer: Medicare Other | Admitting: Family Medicine

## 2013-01-14 LAB — URINE CULTURE

## 2013-01-18 ENCOUNTER — Telehealth: Payer: Self-pay | Admitting: Family Medicine

## 2013-02-16 DIAGNOSIS — N3020 Other chronic cystitis without hematuria: Secondary | ICD-10-CM | POA: Diagnosis not present

## 2013-02-16 DIAGNOSIS — N3946 Mixed incontinence: Secondary | ICD-10-CM | POA: Diagnosis not present

## 2013-05-06 ENCOUNTER — Encounter: Payer: Self-pay | Admitting: *Deleted

## 2013-06-15 DIAGNOSIS — N3946 Mixed incontinence: Secondary | ICD-10-CM | POA: Diagnosis not present

## 2013-06-15 DIAGNOSIS — N3000 Acute cystitis without hematuria: Secondary | ICD-10-CM | POA: Diagnosis not present

## 2013-06-15 DIAGNOSIS — N3020 Other chronic cystitis without hematuria: Secondary | ICD-10-CM | POA: Diagnosis not present

## 2013-06-29 DIAGNOSIS — Z1231 Encounter for screening mammogram for malignant neoplasm of breast: Secondary | ICD-10-CM | POA: Diagnosis not present

## 2013-06-30 ENCOUNTER — Ambulatory Visit: Payer: Medicare Other | Admitting: Family Medicine

## 2013-07-02 ENCOUNTER — Ambulatory Visit (INDEPENDENT_AMBULATORY_CARE_PROVIDER_SITE_OTHER): Payer: Medicare Other

## 2013-07-02 ENCOUNTER — Ambulatory Visit (INDEPENDENT_AMBULATORY_CARE_PROVIDER_SITE_OTHER): Payer: Medicare Other | Admitting: Family Medicine

## 2013-07-02 ENCOUNTER — Other Ambulatory Visit: Payer: Self-pay | Admitting: Urology

## 2013-07-02 ENCOUNTER — Telehealth: Payer: Self-pay

## 2013-07-02 ENCOUNTER — Encounter: Payer: Self-pay | Admitting: Family Medicine

## 2013-07-02 VITALS — BP 141/66 | HR 67 | Temp 97.8°F | Ht 59.0 in | Wt 145.0 lb

## 2013-07-02 DIAGNOSIS — E559 Vitamin D deficiency, unspecified: Secondary | ICD-10-CM

## 2013-07-02 DIAGNOSIS — M5137 Other intervertebral disc degeneration, lumbosacral region: Secondary | ICD-10-CM

## 2013-07-02 DIAGNOSIS — M48061 Spinal stenosis, lumbar region without neurogenic claudication: Secondary | ICD-10-CM

## 2013-07-02 DIAGNOSIS — M810 Age-related osteoporosis without current pathological fracture: Secondary | ICD-10-CM | POA: Diagnosis not present

## 2013-07-02 DIAGNOSIS — Z87440 Personal history of urinary (tract) infections: Secondary | ICD-10-CM

## 2013-07-02 DIAGNOSIS — N390 Urinary tract infection, site not specified: Secondary | ICD-10-CM

## 2013-07-02 DIAGNOSIS — N3946 Mixed incontinence: Secondary | ICD-10-CM

## 2013-07-02 DIAGNOSIS — N3020 Other chronic cystitis without hematuria: Secondary | ICD-10-CM

## 2013-07-02 DIAGNOSIS — E785 Hyperlipidemia, unspecified: Secondary | ICD-10-CM | POA: Diagnosis not present

## 2013-07-02 DIAGNOSIS — Z8739 Personal history of other diseases of the musculoskeletal system and connective tissue: Secondary | ICD-10-CM | POA: Diagnosis not present

## 2013-07-02 LAB — POCT CBC
Granulocyte percent: 76.4 %G (ref 37–80)
HEMATOCRIT: 42.3 % (ref 37.7–47.9)
Hemoglobin: 13.3 g/dL (ref 12.2–16.2)
Lymph, poc: 1.4 (ref 0.6–3.4)
MCH, POC: 27.2 pg (ref 27–31.2)
MCHC: 31.5 g/dL — AB (ref 31.8–35.4)
MCV: 86.3 fL (ref 80–97)
MPV: 8.4 fL (ref 0–99.8)
POC Granulocyte: 5 (ref 2–6.9)
POC LYMPH PERCENT: 21.1 %L (ref 10–50)
Platelet Count, POC: 256 10*3/uL (ref 142–424)
RBC: 4.9 M/uL (ref 4.04–5.48)
RDW, POC: 12.8 %
WBC: 6.5 10*3/uL (ref 4.6–10.2)

## 2013-07-02 NOTE — Telephone Encounter (Signed)
[facility] to [phone] 

## 2013-07-02 NOTE — Progress Notes (Signed)
Subjective:    Patient ID: [name], female    DOB: 1925/04/18, 80 y.o.   MRN: [id number]  HPI Pt here for follow up and management of chronic medical problems. The patient is doing well overall. She will get a chest x-ray today and have lab work done today. She is enjoying her garden despite her age she turned 39 today. Very pleasant very with it.       Patient Active Problem List   Diagnosis Date Noted  . Chronic cystitis 01/08/2013  . Vitamin D deficiency 12/31/2012  . Osteoporosis 12/31/2012  . Spinal stenosis of lumbar region 12/31/2012  . Hyperlipidemia 07/16/2012  . Chronic low back pain 07/16/2012  . Vitamin B12 deficient megaloblastic anemia 07/16/2012   Outpatient Encounter Prescriptions as of 07/02/2013  Medication Sig  . alendronate (FOSAMAX) 35 MG tablet Take 1 tablet (35 mg total) by mouth every 7 (seven) days.  . Cholecalciferol (VITAMIN D3) 2000 UNITS TABS Take 1 tablet by mouth daily.    . fluconazole (DIFLUCAN) 150 MG tablet Take 1 tablet (150 mg total) by mouth daily.  . Multiple Vitamins-Minerals (CENTRUM SILVER) tablet Take 1 tablet by mouth daily.    . nitrofurantoin (MACRODANTIN) 50 MG capsule Take 100 mg by mouth at bedtime. 1 daily with food to control urinary tract infections  . simvastatin (ZOCOR) 80 MG tablet Take 1 tablet (80 mg total) by mouth at bedtime.  . [DISCONTINUED] nitrofurantoin (MACRODANTIN) 50 MG capsule 1 daily with food to control urinary tract infections    Review of Systems  Constitutional: Negative.   HENT: Negative.   Eyes: Negative.   Respiratory: Negative.   Cardiovascular: Negative.   Gastrointestinal: Negative.   Endocrine: Negative.   Genitourinary: Negative.        Bladder and uti issues- following Dr Jeffie Pollock for this now  Musculoskeletal: Negative.   Skin: Negative.   Allergic/Immunologic: Negative.   Neurological: Negative.   Hematological: Negative.   Psychiatric/Behavioral: Negative.        Objective:   Physical Exam  Nursing note and vitals reviewed. Constitutional: She is oriented to person, place, and time. She appears well-developed and well-nourished. No distress.  HENT:  Head: Normocephalic and atraumatic.  Right Ear: External ear normal.  Left Ear: External ear normal.  Nose: Nose normal.  Mouth/Throat: Oropharynx is clear and moist.  Eyes: Conjunctivae and EOM are normal. Pupils are equal, round, and reactive to light. Right eye exhibits no discharge. Left eye exhibits no discharge. No scleral icterus.  Neck: Normal range of motion. Neck supple. No thyromegaly present.  Without bruits  Cardiovascular: Normal rate, regular rhythm, normal heart sounds and intact distal pulses.   No murmur heard. At 72 per minute  Pulmonary/Chest: Effort normal and breath sounds normal. No respiratory distress. She has no wheezes. She has no rales. She exhibits no tenderness.  Abdominal: Soft. Bowel sounds are normal. She exhibits no mass. There is no tenderness. There is no rebound and no guarding.  Genitourinary:  The breast exam was done today and there were no masses lumps or axillary adenopathy.  Musculoskeletal: Normal range of motion. She exhibits no edema and no tenderness.  The patient has good movement with her legs and back and she is somewhat kyphotic clear  Lymphadenopathy:    She has no cervical adenopathy.  Neurological: She is alert and oriented to person, place, and time. She has normal reflexes. No cranial nerve deficit.  Skin: Skin is warm and dry. Rash noted.  Rash right lateral ankle  Psychiatric: She has a normal mood and affect. Her behavior is normal. Judgment and thought content normal.   BP 141/66  Pulse 67  Temp(Src) 97.8 F (36.6 C) (Oral)  Ht $R'4\' 11"'Sr$  (1.499 m)  Wt 145 lb (65.772 kg)  BMI 29.27 kg/m2  BMI discussed with patient and aware to do better with diet and exercise- we will recheck at next office visit.  [facility] reading (PRIMARY) by  Dr   Brunilda Payor x-ray-   no active disease, kyphosis                                     Assessment & Plan:  1. Hyperlipidemia - POCT CBC - BMP8+EGFR - Hepatic function panel - NMR, lipoprofile  2. Osteoporosis - POCT CBC - Vit D  25 hydroxy (rtn osteoporosis monitoring) - DG Chest 2 View; Future  3. Vitamin D deficiency - POCT CBC - Vit D  25 hydroxy (rtn osteoporosis monitoring)  4. History of back pain  5. History of recurrent UTIs -Followed by the urologist  Meds ordered this encounter  Medications  . nitrofurantoin (MACRODANTIN) 50 MG capsule    Sig: Take 100 mg by mouth at bedtime. 1 daily with food to control urinary tract infections   Patient Instructions                       Medicare Annual Wellness Visit  [facility] and the medical providers at [facility] strive to bring you the best medical care.  In doing so we not only want to address your current medical conditions and concerns but also to detect new conditions early and prevent illness, disease and health-related problems.    Medicare offers a yearly Wellness Visit which allows our clinical staff to assess your need for preventative services including immunizations, lifestyle education, counseling to decrease risk of preventable diseases and screening for fall risk and other medical concerns.    This visit is provided free of charge (no copay) for all Medicare recipients. The clinical pharmacists at [facility] have begun to conduct these Wellness Visits which will also include a thorough review of all your medications.    As you primary medical provider recommend that you make an appointment for your Annual Wellness Visit if you have not done so already this year.  You may set up this appointment before you leave today or you may call back ([phone]) and schedule an appointment.  Please make sure when you call that you mention that you are scheduling your Annual Wellness Visit with the  clinical pharmacist so that the appointment may be made for the proper length of time.      Continue current medications. Continue good therapeutic lifestyle changes which include good diet and exercise. Fall precautions discussed with patient. If an FOBT was given today- please return it to our front desk. If you are over 64 years old - you may need Prevnar 15 or the adult Pneumonia vaccine.  Keep appointment with a urologist Remember to drink plenty of fluids in this hot weather   Arrie Senate MD

## 2013-07-02 NOTE — Telephone Encounter (Signed)
Message copied by Koren Bound on Fri Jul 02, 2013  2:25 PM ------      Message from: Chipper Herb      Created: Fri Jul 02, 2013  2:19 PM       As per radiology report-from previous chest x-ray ------

## 2013-07-02 NOTE — Patient Instructions (Addendum)
Medicare Annual Wellness Visit  [facility] and the medical providers at [facility] strive to bring you the best medical care.  In doing so we not only want to address your current medical conditions and concerns but also to detect new conditions early and prevent illness, disease and health-related problems.    Medicare offers a yearly Wellness Visit which allows our clinical staff to assess your need for preventative services including immunizations, lifestyle education, counseling to decrease risk of preventable diseases and screening for fall risk and other medical concerns.    This visit is provided free of charge (no copay) for all Medicare recipients. The clinical pharmacists at [facility] have begun to conduct these Wellness Visits which will also include a thorough review of all your medications.    As you primary medical provider recommend that you make an appointment for your Annual Wellness Visit if you have not done so already this year.  You may set up this appointment before you leave today or you may call back ([phone]) and schedule an appointment.  Please make sure when you call that you mention that you are scheduling your Annual Wellness Visit with the clinical pharmacist so that the appointment may be made for the proper length of time.      Continue current medications. Continue good therapeutic lifestyle changes which include good diet and exercise. Fall precautions discussed with patient. If an FOBT was given today- please return it to our front desk. If you are over 34 years old - you may need Prevnar 23 or the adult Pneumonia vaccine.  Keep appointment with a urologist Remember to drink plenty of fluids in this hot weather

## 2013-07-03 LAB — HEPATIC FUNCTION PANEL
ALK PHOS: 86 IU/L (ref 39–117)
ALT: 8 IU/L (ref 0–32)
AST: 24 IU/L (ref 0–40)
Albumin: 4.5 g/dL (ref 3.5–4.7)
Bilirubin, Direct: 0.13 mg/dL (ref 0.00–0.40)
Total Bilirubin: 0.4 mg/dL (ref 0.0–1.2)
Total Protein: 6.9 g/dL (ref 6.0–8.5)

## 2013-07-03 LAB — BMP8+EGFR
BUN / CREAT RATIO: 13 (ref 11–26)
BUN: 10 mg/dL (ref 8–27)
CHLORIDE: 103 mmol/L (ref 97–108)
CO2: 26 mmol/L (ref 18–29)
Calcium: 9.9 mg/dL (ref 8.7–10.3)
Creatinine, Ser: 0.77 mg/dL (ref 0.57–1.00)
GFR calc non Af Amer: 70 mL/min/{1.73_m2} (ref >59)
GFR, EST AFRICAN AMERICAN: 81 mL/min/{1.73_m2} (ref >59)
Glucose: 105 mg/dL — ABNORMAL HIGH (ref 65–99)
POTASSIUM: 5 mmol/L (ref 3.5–5.2)
Sodium: 145 mmol/L — ABNORMAL HIGH (ref 134–144)

## 2013-07-03 LAB — VITAMIN D 25 HYDROXY (VIT D DEFICIENCY, FRACTURES): VIT D 25 HYDROXY: 37.8 ng/mL (ref 30.0–100.0)

## 2013-07-03 LAB — NMR, LIPOPROFILE
Cholesterol: 168 mg/dL (ref 100–199)
HDL Cholesterol by NMR: 61 mg/dL (ref >39)
HDL Particle Number: 41.5 umol/L (ref >=30.5)
LDL PARTICLE NUMBER: 1070 nmol/L — AB (ref <1000)
LDL SIZE: 20.7 nm (ref >20.5)
LDLC SERPL CALC-MCNC: 76 mg/dL (ref 0–99)
LP-IR SCORE: 44 (ref <=45)
Small LDL Particle Number: 715 nmol/L — ABNORMAL HIGH (ref <=527)
Triglycerides by NMR: 155 mg/dL — ABNORMAL HIGH (ref 0–149)

## 2013-07-28 ENCOUNTER — Other Ambulatory Visit: Payer: Self-pay | Admitting: Urology

## 2013-07-28 ENCOUNTER — Ambulatory Visit (HOSPITAL_COMMUNITY)
Admission: RE | Admit: 2013-07-28 | Discharge: 2013-07-28 | Disposition: A | Discharge status: Home / Self Care | Payer: Medicare Other | Source: Ambulatory Visit | Attending: Urology | Admitting: Urology

## 2013-07-28 DIAGNOSIS — N390 Urinary tract infection, site not specified: Secondary | ICD-10-CM

## 2013-07-28 DIAGNOSIS — N3020 Other chronic cystitis without hematuria: Secondary | ICD-10-CM

## 2013-08-09 ENCOUNTER — Other Ambulatory Visit: Payer: Self-pay | Admitting: Family Medicine

## 2013-08-13 ENCOUNTER — Ambulatory Visit (INDEPENDENT_AMBULATORY_CARE_PROVIDER_SITE_OTHER): Payer: Medicare Other | Admitting: Urology

## 2013-08-13 DIAGNOSIS — N3946 Mixed incontinence: Secondary | ICD-10-CM | POA: Diagnosis not present

## 2013-08-13 DIAGNOSIS — N3020 Other chronic cystitis without hematuria: Secondary | ICD-10-CM | POA: Diagnosis not present

## 2013-08-13 DIAGNOSIS — Z87442 Personal history of urinary calculi: Secondary | ICD-10-CM | POA: Diagnosis not present

## 2013-09-09 NOTE — Progress Notes (Signed)
I don't know how this encounter was generated but there was no visit in [facility] for this patient and I can't see how to delete the encounter.

## 2013-10-19 DIAGNOSIS — N3020 Other chronic cystitis without hematuria: Secondary | ICD-10-CM | POA: Diagnosis not present

## 2013-10-19 DIAGNOSIS — N3946 Mixed incontinence: Secondary | ICD-10-CM | POA: Diagnosis not present

## 2013-11-10 ENCOUNTER — Encounter: Payer: Self-pay | Admitting: Family Medicine

## 2013-11-10 ENCOUNTER — Ambulatory Visit (INDEPENDENT_AMBULATORY_CARE_PROVIDER_SITE_OTHER): Payer: Medicare Other | Admitting: Family Medicine

## 2013-11-10 VITALS — BP 132/65 | HR 70 | Temp 96.8°F | Ht 59.0 in | Wt 142.0 lb

## 2013-11-10 DIAGNOSIS — M48061 Spinal stenosis, lumbar region without neurogenic claudication: Secondary | ICD-10-CM

## 2013-11-10 DIAGNOSIS — E559 Vitamin D deficiency, unspecified: Secondary | ICD-10-CM

## 2013-11-10 DIAGNOSIS — M4806 Spinal stenosis, lumbar region: Secondary | ICD-10-CM | POA: Diagnosis not present

## 2013-11-10 DIAGNOSIS — E785 Hyperlipidemia, unspecified: Secondary | ICD-10-CM

## 2013-11-10 DIAGNOSIS — Z23 Encounter for immunization: Secondary | ICD-10-CM | POA: Diagnosis not present

## 2013-11-10 DIAGNOSIS — Z13820 Encounter for screening for osteoporosis: Secondary | ICD-10-CM | POA: Diagnosis not present

## 2013-11-10 LAB — POCT CBC
Granulocyte percent: 74.4 %G (ref 37–80)
HCT, POC: 42.3 % (ref 37.7–47.9)
Hemoglobin: 13.3 g/dL (ref 12.2–16.2)
Lymph, poc: 1.3 (ref 0.6–3.4)
MCH: 27.1 pg (ref 27–31.2)
MCHC: 31.6 g/dL — AB (ref 31.8–35.4)
MCV: 85.9 fL (ref 80–97)
MPV: 8.2 fL (ref 0–99.8)
PLATELET COUNT, POC: 254 10*3/uL (ref 142–424)
POC Granulocyte: 5.3 (ref 2–6.9)
POC LYMPH %: 18.5 % (ref 10–50)
RBC: 4.9 M/uL (ref 4.04–5.48)
RDW, POC: 13.3 %
WBC: 7.1 10*3/uL (ref 4.6–10.2)

## 2013-11-10 NOTE — Patient Instructions (Addendum)
Medicare Annual Wellness Visit  [facility] and the medical providers at [facility] strive to bring you the best medical care.  In doing so we not only want to address your current medical conditions and concerns but also to detect new conditions early and prevent illness, disease and health-related problems.    Medicare offers a yearly Wellness Visit which allows our clinical staff to assess your need for preventative services including immunizations, lifestyle education, counseling to decrease risk of preventable diseases and screening for fall risk and other medical concerns.    This visit is provided free of charge (no copay) for all Medicare recipients. The clinical pharmacists at [facility] have begun to conduct these Wellness Visits which will also include a thorough review of all your medications.    As you primary medical provider recommend that you make an appointment for your Annual Wellness Visit if you have not done so already this year.  You may set up this appointment before you leave today or you may call back ([phone]) and schedule an appointment.  Please make sure when you call that you mention that you are scheduling your Annual Wellness Visit with the clinical pharmacist so that the appointment may be made for the proper length of time.     Continue current medications. Continue good therapeutic lifestyle changes which include good diet and exercise. Fall precautions discussed with patient. If an FOBT was given today- please return it to our front desk. If you are over 17 years old - you may need Prevnar 81 or the adult Pneumonia vaccine.  Flu Shots will be available at our office starting mid- September. Please call and schedule a FLU CLINIC APPOINTMENT.   Check with your insurance again and see if the cost of an eye exam is still too much to afford Continue to be careful and did not put herself at risk  for falls We will call you with your lab work results once those results are available

## 2013-11-10 NOTE — Progress Notes (Signed)
Subjective:    Patient ID: [name], female    DOB: 23-Jul-1925, 80 y.o.   MRN: [id number]  HPI Pt here for follow up and management of chronic medical problems. The patient has no specific complaints today. She has her on going problems but they are stable. She is due to get a DEXA scan, return and FOBT, get lab work, and she will get a flu shot today. The patient comes to the visit today with her daughter.         Patient Active Problem List   Diagnosis Date Noted  . Chronic cystitis 01/08/2013  . Vitamin D deficiency 12/31/2012  . Osteoporosis 12/31/2012  . Spinal stenosis of lumbar region 12/31/2012  . Hyperlipidemia 07/16/2012  . Chronic low back pain 07/16/2012  . Vitamin B12 deficient megaloblastic anemia 07/16/2012   Outpatient Encounter Prescriptions as of 11/10/2013  Medication Sig  . alendronate (FOSAMAX) 35 MG tablet TAKE 1 TABLET EVERY 7 DAYS  . calcium carbonate (OS-CAL) 600 MG TABS tablet Take 600 mg by mouth daily with breakfast.  . Cholecalciferol (VITAMIN D3) 2000 UNITS TABS Take 1 tablet by mouth daily.    . Multiple Vitamins-Minerals (CENTRUM SILVER) tablet Take 1 tablet by mouth daily.    . nitrofurantoin, macrocrystal-monohydrate, (MACROBID) 100 MG capsule Take 1 capsule by mouth daily.  . simvastatin (ZOCOR) 80 MG tablet Take 1 tablet (80 mg total) by mouth at bedtime.  . [DISCONTINUED] fluconazole (DIFLUCAN) 150 MG tablet Take 1 tablet (150 mg total) by mouth daily.  . [DISCONTINUED] nitrofurantoin (MACRODANTIN) 50 MG capsule Take 100 mg by mouth at bedtime. 1 daily with food to control urinary tract infections    Review of Systems  Constitutional: Negative.   HENT: Negative.   Eyes: Negative.   Respiratory: Negative.   Cardiovascular: Negative.   Gastrointestinal: Negative.   Endocrine: Negative.   Genitourinary: Negative.   Musculoskeletal: Negative.   Skin: Negative.   Allergic/Immunologic: Negative.   Neurological: Negative.     Hematological: Negative.   Psychiatric/Behavioral: Negative.        Objective:   Physical Exam  Nursing note and vitals reviewed. Constitutional: She is oriented to person, place, and time. She appears well-developed and well-nourished.  The patient is alert kind and cooperative. She is somewhat kyphotic. She moves well and is having no problems with her back. She has been active physically.  HENT:  Head: Normocephalic and atraumatic.  Nose: Nose normal.  Mouth/Throat: Oropharynx is clear and moist. No oropharyngeal exudate.  There is some ear cerumen bilaterally.  Eyes: Conjunctivae and EOM are normal. Pupils are equal, round, and reactive to light. Right eye exhibits no discharge. Left eye exhibits no discharge. No scleral icterus.  Neck: Normal range of motion. Neck supple. No thyromegaly present.  No carotid bruits  Cardiovascular: Normal rate, regular rhythm, normal heart sounds and intact distal pulses.  Exam reveals no gallop and no friction rub.   No murmur heard. The rhythm is regular at 72/m  Pulmonary/Chest: Effort normal and breath sounds normal. No respiratory distress. She has no wheezes. She has no rales. She exhibits no tenderness.  Abdominal: Soft. Bowel sounds are normal. She exhibits no mass. There is no tenderness. There is no rebound and no guarding.  Musculoskeletal: Normal range of motion. She exhibits no edema and no tenderness.  Lymphadenopathy:    She has no cervical adenopathy.  Neurological: She is alert and oriented to person, place, and time. She has normal reflexes. No cranial  nerve deficit.  Skin: Skin is warm and dry. No rash noted.  Psychiatric: She has a normal mood and affect. Her behavior is normal. Judgment and thought content normal.   BP 132/65  Pulse 70  Temp(Src) 96.8 F (36 C) (Oral)  Ht 4' 11" (1.499 m)  Wt 142 lb (64.411 kg)  BMI 28.67 kg/m2        Assessment & Plan:  1. Vitamin D deficiency - POCT CBC - Vit D  25 hydroxy  (rtn osteoporosis monitoring) - DG Bone Density; Future  2. Hyperlipidemia - POCT CBC - BMP8+EGFR - Hepatic function panel - NMR, lipoprofile  3. Screening for osteoporosis - POCT CBC - DG Bone Density; Future  4. Spinal stenosis of lumbar region  Patient Instructions                       Medicare Annual Wellness Visit  [facility] and the medical providers at [facility] strive to bring you the best medical care.  In doing so we not only want to address your current medical conditions and concerns but also to detect new conditions early and prevent illness, disease and health-related problems.    Medicare offers a yearly Wellness Visit which allows our clinical staff to assess your need for preventative services including immunizations, lifestyle education, counseling to decrease risk of preventable diseases and screening for fall risk and other medical concerns.    This visit is provided free of charge (no copay) for all Medicare recipients. The clinical pharmacists at [facility] have begun to conduct these Wellness Visits which will also include a thorough review of all your medications.    As you primary medical provider recommend that you make an appointment for your Annual Wellness Visit if you have not done so already this year.  You may set up this appointment before you leave today or you may call back ([phone]) and schedule an appointment.  Please make sure when you call that you mention that you are scheduling your Annual Wellness Visit with the clinical pharmacist so that the appointment may be made for the proper length of time.     Continue current medications. Continue good therapeutic lifestyle changes which include good diet and exercise. Fall precautions discussed with patient. If an FOBT was given today- please return it to our front desk. If you are over 73 years old - you may need Prevnar 52 or the adult Pneumonia  vaccine.  Flu Shots will be available at our office starting mid- September. Please call and schedule a FLU CLINIC APPOINTMENT.   Check with your insurance again and see if the cost of an eye exam is still too much to afford Continue to be careful and did not put herself at risk for falls We will call you with your lab work results once those results are available   Arrie Senate MD

## 2013-11-11 ENCOUNTER — Telehealth: Payer: Self-pay | Admitting: *Deleted

## 2013-11-11 LAB — HEPATIC FUNCTION PANEL
ALT: 9 IU/L (ref 0–32)
AST: 27 IU/L (ref 0–40)
Albumin: 4.6 g/dL (ref 3.5–4.7)
Alkaline Phosphatase: 84 IU/L (ref 39–117)
BILIRUBIN TOTAL: 0.4 mg/dL (ref 0.0–1.2)
Bilirubin, Direct: 0.14 mg/dL (ref 0.00–0.40)
Total Protein: 7 g/dL (ref 6.0–8.5)

## 2013-11-11 LAB — BMP8+EGFR
BUN/Creatinine Ratio: 11 (ref 11–26)
BUN: 9 mg/dL (ref 8–27)
CALCIUM: 9.9 mg/dL (ref 8.7–10.3)
CO2: 26 mmol/L (ref 18–29)
CREATININE: 0.8 mg/dL (ref 0.57–1.00)
Chloride: 100 mmol/L (ref 97–108)
GFR calc Af Amer: 77 mL/min/{1.73_m2} (ref >59)
GFR, EST NON AFRICAN AMERICAN: 67 mL/min/{1.73_m2} (ref >59)
Glucose: 100 mg/dL — ABNORMAL HIGH (ref 65–99)
Potassium: 4.5 mmol/L (ref 3.5–5.2)
SODIUM: 141 mmol/L (ref 134–144)

## 2013-11-11 LAB — NMR, LIPOPROFILE
CHOLESTEROL: 177 mg/dL (ref 100–199)
HDL CHOLESTEROL BY NMR: 73 mg/dL (ref >39)
HDL Particle Number: 43.7 umol/L (ref >=30.5)
LDL Particle Number: 1033 nmol/L — ABNORMAL HIGH (ref <1000)
LDL Size: 21.1 nm (ref >20.5)
LDL-C: 82 mg/dL (ref 0–99)
LP-IR Score: 41 (ref <=45)
SMALL LDL PARTICLE NUMBER: 459 nmol/L (ref <=527)
TRIGLYCERIDES BY NMR: 110 mg/dL (ref 0–149)

## 2013-11-11 LAB — VITAMIN D 25 HYDROXY (VIT D DEFICIENCY, FRACTURES): Vit D, 25-Hydroxy: 44.6 ng/mL (ref 30.0–100.0)

## 2013-11-11 NOTE — Telephone Encounter (Signed)
Pt notified of results Verbalizes understanding 

## 2013-11-11 NOTE — Telephone Encounter (Signed)
Message copied by Marin Olp on Thu Nov 11, 2013  3:07 PM ------      Message from: Chipper Herb      Created: Thu Nov 11, 2013  2:52 PM       The blood sugar slightly elevated at 100. The creatinine the most important kidney function test is within normal limits. The electrolytes including potassium are within normal limits.      All liver function tests are within normal limits      Cholesterol numbers with advanced lipid testing have only a slight elevation of the LDL particle number. The patient should continue with her simvastatin and with as aggressive therapeutic lifestyle changes as possible which include diet and exercise. The triglycerides and HDL particle number are good.      The vitamin D level is good, continue current treatment ------

## 2013-11-23 DIAGNOSIS — L570 Actinic keratosis: Secondary | ICD-10-CM | POA: Diagnosis not present

## 2013-11-23 DIAGNOSIS — Z85828 Personal history of other malignant neoplasm of skin: Secondary | ICD-10-CM | POA: Diagnosis not present

## 2014-01-28 ENCOUNTER — Other Ambulatory Visit: Payer: Self-pay | Admitting: Family

## 2014-01-28 ENCOUNTER — Ambulatory Visit (INDEPENDENT_AMBULATORY_CARE_PROVIDER_SITE_OTHER): Payer: Medicare Other | Admitting: Family

## 2014-01-28 ENCOUNTER — Encounter: Payer: Self-pay | Admitting: Family

## 2014-01-28 VITALS — BP 127/65 | HR 75 | Temp 97.6°F | Ht 59.0 in | Wt 142.8 lb

## 2014-01-28 DIAGNOSIS — J309 Allergic rhinitis, unspecified: Secondary | ICD-10-CM

## 2014-01-28 DIAGNOSIS — R531 Weakness: Secondary | ICD-10-CM

## 2014-01-28 LAB — POCT URINALYSIS DIPSTICK
BILIRUBIN UA: NEGATIVE
Glucose, UA: NEGATIVE
Ketones, UA: NEGATIVE
Nitrite, UA: NEGATIVE
PROTEIN UA: NEGATIVE
RBC UA: NEGATIVE
Spec Grav, UA: 1.01
Urobilinogen, UA: NEGATIVE
pH, UA: 5

## 2014-01-28 LAB — POCT UA - MICROSCOPIC ONLY
CRYSTALS, UR, HPF, POC: NEGATIVE
Casts, Ur, LPF, POC: NEGATIVE
Mucus, UA: NEGATIVE
RBC, urine, microscopic: NEGATIVE
Yeast, UA: NEGATIVE

## 2014-01-28 MED ORDER — FLUTICASONE PROPIONATE 50 MCG/ACT NA SUSP: 2.0000 | Freq: Every day | NASAL | Status: DC

## 2014-01-28 MED ORDER — SULFAMETHOXAZOLE-TRIMETHOPRIM 800-160 MG PO TABS: 1.0000 | ORAL_TABLET | Freq: Two times a day (BID) | ORAL | Status: DC

## 2014-01-28 NOTE — Patient Instructions (Signed)

## 2014-01-28 NOTE — Progress Notes (Signed)
   Subjective:    Patient ID: [name], female    DOB: 1925/04/03, 81 y.o.   MRN: [id number]  Diarrhea  This is a new problem. The current episode started in the past 7 days (Monday night- Tuesday). The problem has been resolved. The stool consistency is described as watery. Associated symptoms include headaches and vomiting. Pertinent negatives include no fever. She has tried nothing for the symptoms. The treatment provided moderate relief.   *Pt started having diarrhea on Monday night into Tuesday. She has felt weak and tired since then. Pt states she has been trying to keep hydrated.    Review of Systems  Constitutional: Negative.  Negative for fever.  Eyes: Negative.   Respiratory: Negative.  Negative for shortness of breath.   Cardiovascular: Negative.  Negative for palpitations.  Gastrointestinal: Positive for vomiting and diarrhea.  Endocrine: Negative.   Genitourinary: Negative.   Musculoskeletal: Negative.   Neurological: Positive for headaches.  Hematological: Negative.   Psychiatric/Behavioral: Negative.   All other systems reviewed and are negative.      Objective:   Physical Exam  Constitutional: She is oriented to person, place, and time. She appears well-developed and well-nourished. No distress.  HENT:  Head: Normocephalic and atraumatic.  Right Ear: External ear normal.  Left Ear: External ear normal.  Nasal passage erythemas with mild swelling   Eyes: Pupils are equal, round, and reactive to light.  Neck: Normal range of motion. Neck supple. No thyromegaly present.  Cardiovascular: Normal rate, regular rhythm, normal heart sounds and intact distal pulses.   No murmur heard. Pulmonary/Chest: Effort normal and breath sounds normal. No respiratory distress. She has no wheezes.  Abdominal: Soft. Bowel sounds are normal. She exhibits no distension. There is tenderness (mild tenderness LUQ).  Musculoskeletal: Normal range of motion. She exhibits no edema or  tenderness.  Neurological: She is alert and oriented to person, place, and time. She has normal reflexes. No cranial nerve deficit.  Skin: Skin is warm and dry.  Psychiatric: She has a normal mood and affect. Her behavior is normal. Judgment and thought content normal.  Vitals reviewed.   BP 127/65 mmHg  Pulse 75  Temp(Src) 97.6 F (36.4 C) (Oral)  Ht 4\' 11"  (1.499 m)  Wt 142 lb 12.8 oz (64.774 kg)  BMI 28.83 kg/m2       Assessment & Plan:  1. Weakness -Force fluids -Rest -Avoid greasy foods - POCT urinalysis dipstick - POCT UA - Microscopic Only  2. Allergic rhinitis, unspecified allergic rhinitis type -Avoid allergens  - fluticasone (FLONASE) 50 MCG/ACT nasal spray; Place 2 sprays into both nostrils daily.  Dispense: 16 g; Refill: [place], FNP

## 2014-01-28 NOTE — Progress Notes (Signed)
RX sent to pharmacy  

## 2014-01-29 ENCOUNTER — Other Ambulatory Visit: Payer: Self-pay | Admitting: Family Medicine

## 2014-01-29 ENCOUNTER — Telehealth: Payer: Self-pay | Admitting: Family Medicine

## 2014-01-29 NOTE — Telephone Encounter (Signed)
Patient aware of results and that rx has been sent to pharmacy

## 2014-02-09 ENCOUNTER — Encounter: Payer: Self-pay | Admitting: Pharmacist

## 2014-02-09 ENCOUNTER — Telehealth: Payer: Self-pay | Admitting: Pharmacist

## 2014-02-09 ENCOUNTER — Ambulatory Visit (INDEPENDENT_AMBULATORY_CARE_PROVIDER_SITE_OTHER): Payer: Medicare Other

## 2014-02-09 ENCOUNTER — Other Ambulatory Visit: Payer: Self-pay | Admitting: *Deleted

## 2014-02-09 ENCOUNTER — Ambulatory Visit (INDEPENDENT_AMBULATORY_CARE_PROVIDER_SITE_OTHER): Payer: Medicare Other | Admitting: Pharmacist

## 2014-02-09 VITALS — BP 104/70 | HR 76 | Ht 58.5 in | Wt 142.0 lb

## 2014-02-09 DIAGNOSIS — N3000 Acute cystitis without hematuria: Secondary | ICD-10-CM

## 2014-02-09 DIAGNOSIS — Z0000 Encounter for general adult medical examination without abnormal findings: Secondary | ICD-10-CM | POA: Diagnosis not present

## 2014-02-09 DIAGNOSIS — N390 Urinary tract infection, site not specified: Secondary | ICD-10-CM | POA: Diagnosis not present

## 2014-02-09 DIAGNOSIS — E559 Vitamin D deficiency, unspecified: Secondary | ICD-10-CM

## 2014-02-09 DIAGNOSIS — M810 Age-related osteoporosis without current pathological fracture: Secondary | ICD-10-CM

## 2014-02-09 DIAGNOSIS — Z13820 Encounter for screening for osteoporosis: Secondary | ICD-10-CM | POA: Diagnosis not present

## 2014-02-09 MED ORDER — ALENDRONATE SODIUM 70 MG PO TABS: 70.0000 mg | ORAL_TABLET | ORAL | Status: DC

## 2014-02-09 NOTE — Patient Instructions (Addendum)
Can bring in Living Will / [place] / Financial controller.  We can keep copy on file if wanted.   Consider grab bars in shower or bath to decrease falls and feel more comfortable bathing.  Change multivitamin to taking in the afternoon or evening.  Continue calcium 1 tablet each morning   Preventive Care for Adults A healthy lifestyle and preventive care can promote health and wellness. Preventive health guidelines for women include the following key practices.  A routine yearly physical is a good way to check with your health care provider about your health and preventive screening. It is a chance to share any concerns and updates on your health and to receive a thorough exam.  Visit your dentist for a routine exam and preventive care every 6 months. Brush your teeth twice a day and floss once a day. Good oral hygiene prevents tooth decay and gum disease.  The frequency of eye exams is based on your age, health, family medical history, use of contact lenses, and other factors. Follow your health care provider's recommendations for frequency of eye exams.  Eat a healthy diet. Foods like vegetables, fruits, whole grains, low-fat dairy products, and lean protein foods contain the nutrients you need without too many calories. Decrease your intake of foods high in solid fats, added sugars, and salt. Eat the right amount of calories for you.Get information about a proper diet from your health care provider, if necessary.  Regular physical exercise is one of the most important things you can do for your health. Most adults should get at least 150 minutes of moderate-intensity exercise (any activity that increases your heart rate and causes you to sweat) each week. In addition, most adults need muscle-strengthening exercises on 2 or more days a week.  Maintain a healthy weight. The body mass index (BMI) is a screening tool to identify possible weight problems. It provides an estimate  of body fat based on height and weight. Your health care provider can find your BMI and can help you achieve or maintain a healthy weight.For adults 20 years and older:  A BMI below 18.5 is considered underweight.  A BMI of 18.5 to 24.9 is normal.  A BMI of 25 to 29.9 is considered overweight.  A BMI of 30 and above is considered obese.  Maintain normal blood lipids and cholesterol levels by exercising and minimizing your intake of saturated fat. Eat a balanced diet with plenty of fruit and vegetables. Blood tests for lipids and cholesterol should begin at age 50 and be repeated every 5 years. If your lipid or cholesterol levels are high, you are over 50, or you are at high risk for heart disease, you may need your cholesterol levels checked more frequently.Ongoing high lipid and cholesterol levels should be treated with medicines if diet and exercise are not working.  If you smoke, find out from your health care provider how to quit. If you do not use tobacco, do not start.  Lung cancer screening is recommended for adults aged 16-80 years who are at high risk for developing lung cancer because of a history of smoking. A yearly low-dose CT scan of the lungs is recommended for people who have at least a 30-pack-year history of smoking and are a current smoker or have quit within the past 15 years. A pack year of smoking is smoking an average of 1 pack of cigarettes a day for 1 year (for example: 1 pack a day for 30  years or 2 packs a day for 15 years). Yearly screening should continue until the smoker has stopped smoking for at least 15 years. Yearly screening should be stopped for people who develop a health problem that would prevent them from having lung cancer treatment.  If you are pregnant, do not drink alcohol. If you are breastfeeding, be very cautious about drinking alcohol. If you are not pregnant and choose to drink alcohol, do not have more than 1 drink per day. One drink is considered  to be 12 ounces (355 mL) of beer, 5 ounces (148 mL) of wine, or 1.5 ounces (44 mL) of liquor.  Avoid use of street drugs. Do not share needles with anyone. Ask for help if you need support or instructions about stopping the use of drugs.  High blood pressure causes heart disease and increases the risk of stroke. Your blood pressure should be checked at least every 1 to 2 years. Ongoing high blood pressure should be treated with medicines if weight loss and exercise do not work.  If you are 74-46 years old, ask your health care provider if you should take aspirin to prevent strokes.  Diabetes screening involves taking a blood sample to check your fasting blood sugar level. This should be done once every 3 years, after age 26, if you are within normal weight and without risk factors for diabetes. Testing should be considered at a younger age or be carried out more frequently if you are overweight and have at least 1 risk factor for diabetes.  Breast cancer screening is essential preventive care for women. You should practice "breast self-awareness." This means understanding the normal appearance and feel of your breasts and may include breast self-examination. Any changes detected, no matter how small, should be reported to a health care provider. Women in their 49s and 30s should have a clinical breast exam (CBE) by a health care provider as part of a regular health exam every 1 to 3 years. After age 53, women should have a CBE every year. Starting at age 18, women should consider having a mammogram (breast X-ray test) every year. Women who have a family history of breast cancer should talk to their health care provider about genetic screening. Women at a high risk of breast cancer should talk to their health care providers about having an MRI and a mammogram every year.  Breast cancer gene (BRCA)-related cancer risk assessment is recommended for women who have family members with BRCA-related cancers.  BRCA-related cancers include breast, ovarian, tubal, and peritoneal cancers. Having family members with these cancers may be associated with an increased risk for harmful changes (mutations) in the breast cancer genes BRCA1 and BRCA2. Results of the assessment will determine the need for genetic counseling and BRCA1 and BRCA2 testing.  Routine pelvic exams to screen for cancer are no longer recommended for nonpregnant women who are considered low risk for cancer of the pelvic organs (ovaries, uterus, and vagina) and who do not have symptoms. Ask your health care provider if a screening pelvic exam is right for you.  If you have had past treatment for cervical cancer or a condition that could lead to cancer, you need Pap tests and screening for cancer for at least 20 years after your treatment. If Pap tests have been discontinued, your risk factors (such as having a new sexual partner) need to be reassessed to determine if screening should be resumed. Some women have medical problems that increase the chance of getting  cervical cancer. In these cases, your health care provider may recommend more frequent screening and Pap tests.  The HPV test is an additional test that may be used for cervical cancer screening. The HPV test looks for the virus that can cause the cell changes on the cervix. The cells collected during the Pap test can be tested for HPV. The HPV test could be used to screen women aged 49 years and older, and should be used in women of any age who have unclear Pap test results. After the age of 46, women should have HPV testing at the same frequency as a Pap test.  Colorectal cancer can be detected and often prevented. Most routine colorectal cancer screening begins at the age of 35 years and continues through age 47 years. However, your health care provider may recommend screening at an earlier age if you have risk factors for colon cancer. On a yearly basis, your health care provider may  provide home test kits to check for hidden blood in the stool. Use of a small camera at the end of a tube, to directly examine the colon (sigmoidoscopy or colonoscopy), can detect the earliest forms of colorectal cancer. Talk to your health care provider about this at age 51, when routine screening begins. Direct exam of the colon should be repeated every 5-10 years through age 68 years, unless early forms of pre-cancerous polyps or small growths are found.  People who are at an increased risk for hepatitis B should be screened for this virus. You are considered at high risk for hepatitis B if:  You were born in a country where hepatitis B occurs often. Talk with your health care provider about which countries are considered high risk.  Your parents were born in a high-risk country and you have not received a shot to protect against hepatitis B (hepatitis B vaccine).  You have HIV or AIDS.  You use needles to inject street drugs.  You live with, or have sex with, someone who has hepatitis B.  You get hemodialysis treatment.  You take certain medicines for conditions like cancer, organ transplantation, and autoimmune conditions.  Hepatitis C blood testing is recommended for all people born from [birth year] through 1965 and any individual with known risks for hepatitis C.  Practice safe sex. Use condoms and avoid high-risk sexual practices to reduce the spread of sexually transmitted infections (STIs). STIs include gonorrhea, chlamydia, syphilis, trichomonas, herpes, HPV, and human immunodeficiency virus (HIV). Herpes, HIV, and HPV are viral illnesses that have no cure. They can result in disability, cancer, and death.  You should be screened for sexually transmitted illnesses (STIs) including gonorrhea and chlamydia if:  You are sexually active and are younger than 24 years.  You are older than 24 years and your health care provider tells you that you are at risk for this type of  infection.  Your sexual activity has changed since you were last screened and you are at an increased risk for chlamydia or gonorrhea. Ask your health care provider if you are at risk.  If you are at risk of being infected with HIV, it is recommended that you take a prescription medicine daily to prevent HIV infection. This is called preexposure prophylaxis (PrEP). You are considered at risk if:  You are a heterosexual woman, are sexually active, and are at increased risk for HIV infection.  You take drugs by injection.  You are sexually active with a partner who has HIV.  Talk with  your health care provider about whether you are at high risk of being infected with HIV. If you choose to begin PrEP, you should first be tested for HIV. You should then be tested every 3 months for as long as you are taking PrEP.  Osteoporosis is a disease in which the bones lose minerals and strength with aging. This can result in serious bone fractures or breaks. The risk of osteoporosis can be identified using a bone density scan. Women ages 87 years and over and women at risk for fractures or osteoporosis should discuss screening with their health care providers. Ask your health care provider whether you should take a calcium supplement or vitamin D to reduce the rate of osteoporosis.  Menopause can be associated with physical symptoms and risks. Hormone replacement therapy is available to decrease symptoms and risks. You should talk to your health care provider about whether hormone replacement therapy is right for you.  Use sunscreen. Apply sunscreen liberally and repeatedly throughout the day. You should seek shade when your shadow is shorter than you. Protect yourself by wearing long sleeves, pants, a wide-brimmed hat, and sunglasses year round, whenever you are outdoors.  Once a month, do a whole body skin exam, using a mirror to look at the skin on your back. Tell your health care provider of new moles,  moles that have irregular borders, moles that are larger than a pencil eraser, or moles that have changed in shape or color.  Stay current with required vaccines (immunizations).  Influenza vaccine. All adults should be immunized every year.  Tetanus, diphtheria, and acellular pertussis (Td, Tdap) vaccine. Pregnant women should receive 1 dose of Tdap vaccine during each pregnancy. The dose should be obtained regardless of the length of time since the last dose. Immunization is preferred during the 27th-36th week of gestation. An adult who has not previously received Tdap or who does not know her vaccine status should receive 1 dose of Tdap. This initial dose should be followed by tetanus and diphtheria toxoids (Td) booster doses every 10 years. Adults with an unknown or incomplete history of completing a 3-dose immunization series with Td-containing vaccines should begin or complete a primary immunization series including a Tdap dose. Adults should receive a Td booster every 10 years.  Varicella vaccine. An adult without evidence of immunity to varicella should receive 2 doses or a second dose if she has previously received 1 dose. Pregnant females who do not have evidence of immunity should receive the first dose after pregnancy. This first dose should be obtained before leaving the health care facility. The second dose should be obtained 4-8 weeks after the first dose.  Human papillomavirus (HPV) vaccine. Females aged 13-26 years who have not received the vaccine previously should obtain the 3-dose series. The vaccine is not recommended for use in pregnant females. However, pregnancy testing is not needed before receiving a dose. If a female is found to be pregnant after receiving a dose, no treatment is needed. In that case, the remaining doses should be delayed until after the pregnancy. Immunization is recommended for any person with an immunocompromised condition through the age of 74 years if she  did not get any or all doses earlier. During the 3-dose series, the second dose should be obtained 4-8 weeks after the first dose. The third dose should be obtained 24 weeks after the first dose and 16 weeks after the second dose.  Zoster vaccine. One dose is recommended for adults aged 2  years or older unless certain conditions are present.  Measles, mumps, and rubella (MMR) vaccine. Adults born before [birth year] generally are considered immune to measles and mumps. Adults born in [birth year] or later should have 1 or more doses of MMR vaccine unless there is a contraindication to the vaccine or there is laboratory evidence of immunity to each of the three diseases. A routine second dose of MMR vaccine should be obtained at least 28 days after the first dose for students attending postsecondary schools, health care workers, or international travelers. People who received inactivated measles vaccine or an unknown type of measles vaccine during 1963-1967 should receive 2 doses of MMR vaccine. People who received inactivated mumps vaccine or an unknown type of mumps vaccine before 1979 and are at high risk for mumps infection should consider immunization with 2 doses of MMR vaccine. For females of childbearing age, rubella immunity should be determined. If there is no evidence of immunity, females who are not pregnant should be vaccinated. If there is no evidence of immunity, females who are pregnant should delay immunization until after pregnancy. Unvaccinated health care workers born before [birth year] who lack laboratory evidence of measles, mumps, or rubella immunity or laboratory confirmation of disease should consider measles and mumps immunization with 2 doses of MMR vaccine or rubella immunization with 1 dose of MMR vaccine.  Pneumococcal 13-valent conjugate (PCV13) vaccine. When indicated, a person who is uncertain of her immunization history and has no record of immunization should receive the PCV13 vaccine. An adult  aged 81 years or older who has certain medical conditions and has not been previously immunized should receive 1 dose of PCV13 vaccine. This PCV13 should be followed with a dose of pneumococcal polysaccharide (PPSV23) vaccine. The PPSV23 vaccine dose should be obtained at least 8 weeks after the dose of PCV13 vaccine. An adult aged 33 years or older who has certain medical conditions and previously received 1 or more doses of PPSV23 vaccine should receive 1 dose of PCV13. The PCV13 vaccine dose should be obtained 1 or more years after the last PPSV23 vaccine dose.  Pneumococcal polysaccharide (PPSV23) vaccine. When PCV13 is also indicated, PCV13 should be obtained first. All adults aged 2 years and older should be immunized. An adult younger than age 65 years who has certain medical conditions should be immunized. Any person who resides in a nursing home or long-term care facility should be immunized. An adult smoker should be immunized. People with an immunocompromised condition and certain other conditions should receive both PCV13 and PPSV23 vaccines. People with human immunodeficiency virus (HIV) infection should be immunized as soon as possible after diagnosis. Immunization during chemotherapy or radiation therapy should be avoided. Routine use of PPSV23 vaccine is not recommended for American Indians, [place] Natives, or people younger than 65 years unless there are medical conditions that require PPSV23 vaccine. When indicated, people who have unknown immunization and have no record of immunization should receive PPSV23 vaccine. One-time revaccination 5 years after the first dose of PPSV23 is recommended for people aged 19-64 years who have chronic kidney failure, nephrotic syndrome, asplenia, or immunocompromised conditions. People who received 1-2 doses of PPSV23 before age 73 years should receive another dose of PPSV23 vaccine at age 44 years or later if at least 5 years have passed since the previous  dose. Doses of PPSV23 are not needed for people immunized with PPSV23 at or after age 15 years.  Meningococcal vaccine. Adults with asplenia or persistent complement component deficiencies should  receive 2 doses of quadrivalent meningococcal conjugate (MenACWY-D) vaccine. The doses should be obtained at least 2 months apart. Microbiologists working with certain meningococcal bacteria, [employer] recruits, people at risk during an outbreak, and people who travel to or live in countries with a high rate of meningitis should be immunized. A first-year college student up through age 22 years who is living in a residence hall should receive a dose if she did not receive a dose on or after her 16th birthday. Adults who have certain high-risk conditions should receive one or more doses of vaccine.  Hepatitis A vaccine. Adults who wish to be protected from this disease, have certain high-risk conditions, work with hepatitis A-infected animals, work in hepatitis A research labs, or travel to or work in countries with a high rate of hepatitis A should be immunized. Adults who were previously unvaccinated and who anticipate close contact with an international adoptee during the first 60 days after arrival in the [place] from a country with a high rate of hepatitis A should be immunized.  Hepatitis B vaccine. Adults who wish to be protected from this disease, have certain high-risk conditions, may be exposed to blood or other infectious body fluids, are household contacts or sex partners of hepatitis B positive people, are clients or workers in certain care facilities, or travel to or work in countries with a high rate of hepatitis B should be immunized.  Haemophilus influenzae type b (Hib) vaccine. A previously unvaccinated person with asplenia or sickle cell disease or having a scheduled splenectomy should receive 1 dose of Hib vaccine. Regardless of previous immunization, a recipient of a hematopoietic stem cell  transplant should receive a 3-dose series 6-12 months after her successful transplant. Hib vaccine is not recommended for adults with HIV infection. Preventive Services / Frequency Ages 61 years and over  Blood pressure check.** / Every 1 to 2 years.  Lipid and cholesterol check.** / Every 5 years beginning at age 84 years.  Lung cancer screening. / Every year if you are aged 76-80 years and have a 30-pack-year history of smoking and currently smoke or have quit within the past 15 years. Yearly screening is stopped once you have quit smoking for at least 15 years or develop a health problem that would prevent you from having lung cancer treatment.  Clinical breast exam.** / Every year after age 59 years.  BRCA-related cancer risk assessment.** / For women who have family members with a BRCA-related cancer (breast, ovarian, tubal, or peritoneal cancers).  Mammogram.** / Every year beginning at age 90 years and continuing for as long as you are in good health. Consult with your health care provider.  Pap test.** / Every 3 years starting at age 16 years through age 75 or 19 years with 3 consecutive normal Pap tests. Testing can be stopped between 65 and 70 years with 3 consecutive normal Pap tests and no abnormal Pap or HPV tests in the past 10 years.  HPV screening.** / Every 3 years from ages 68 years through ages 74 or 27 years with a history of 3 consecutive normal Pap tests. Testing can be stopped between 65 and 70 years with 3 consecutive normal Pap tests and no abnormal Pap or HPV tests in the past 10 years.  Fecal occult blood test (FOBT) of stool. / Every year beginning at age 49 years and continuing until age 47 years. You may not need to do this test if you get a colonoscopy every 10  years.  Flexible sigmoidoscopy or colonoscopy.** / Every 5 years for a flexible sigmoidoscopy or every 10 years for a colonoscopy beginning at age 49 years and continuing until age 56 years.  Hepatitis C  blood test.** / For all people born from [birth year] through 1963 and any individual with known risks for hepatitis C.  Osteoporosis screening.** / A one-time screening for women ages 48 years and over and women at risk for fractures or osteoporosis.  Skin self-exam. / Monthly.  Influenza vaccine. / Every year.  Tetanus, diphtheria, and acellular pertussis (Tdap/Td) vaccine.** / 1 dose of Td every 10 years.  Varicella vaccine.** / Consult your health care provider.  Zoster vaccine.** / 1 dose for adults aged 69 years or older.  Pneumococcal 13-valent conjugate (PCV13) vaccine.** / Consult your health care provider.  Pneumococcal polysaccharide (PPSV23) vaccine.** / 1 dose for all adults aged 33 years and older.  Meningococcal vaccine.** / Consult your health care provider.  Hepatitis A vaccine.** / Consult your health care provider.  Hepatitis B vaccine.** / Consult your health care provider.  Haemophilus influenzae type b (Hib) vaccine.** / Consult your health care provider. ** Family history and personal history of risk and conditions may change your health care provider's recommendations. Document Released: 02/26/2001 Document Revised: 05/17/2013 Document Reviewed: 05/28/2010 [facility] Patient Information 2015 [place], [place]. This information is not intended to replace advice given to you by your health care provider. Make sure you discuss any questions you have with your health care provider.   Fall Prevention and Home Safety Falls cause injuries and can affect all age groups. It is possible to use preventive measures to significantly decrease the likelihood of falls. There are many simple measures which can make your home safer and prevent falls. OUTDOORS  Repair cracks and edges of walkways and driveways.  Remove high doorway thresholds.  Trim shrubbery on the main path into your home.  Have good outside lighting.  Clear walkways of tools, rocks, debris, and clutter.  Check  that handrails are not broken and are securely fastened. Both sides of steps should have handrails.  Have leaves, snow, and ice cleared regularly.  Use sand or salt on walkways during winter months.  In the garage, clean up grease or oil spills. BATHROOM  Install night lights.  Install grab bars by the toilet and in the tub and shower.  Use non-skid mats or decals in the tub or shower.  Place a plastic non-slip stool in the shower to sit on, if needed.  Keep floors dry and clean up all water on the floor immediately.  Remove soap buildup in the tub or shower on a regular basis.  Secure bath mats with non-slip, double-sided rug tape.  Remove throw rugs and tripping hazards from the floors. BEDROOMS  Install night lights.  Make sure a bedside light is easy to reach.  Do not use oversized bedding.  Keep a telephone by your bedside.  Have a firm chair with side arms to use for getting dressed.  Remove throw rugs and tripping hazards from the floor. KITCHEN  Keep handles on pots and pans turned toward the center of the stove. Use back burners when possible.  Clean up spills quickly and allow time for drying.  Avoid walking on wet floors.  Avoid hot utensils and knives.  Position shelves so they are not too high or low.  Place commonly used objects within easy reach.  If necessary, use a sturdy step stool with a grab bar  when reaching.  Keep electrical cables out of the way.  Do not use floor polish or wax that makes floors slippery. If you must use wax, use non-skid floor wax.  Remove throw rugs and tripping hazards from the floor. STAIRWAYS  Never leave objects on stairs.  Place handrails on both sides of stairways and use them. Fix any loose handrails. Make sure handrails on both sides of the stairways are as long as the stairs.  Check carpeting to make sure it is firmly attached along stairs. Make repairs to worn or loose carpet promptly.  Avoid placing  throw rugs at the top or bottom of stairways, or properly secure the rug with carpet tape to prevent slippage. Get rid of throw rugs, if possible.  Have an electrician put in a light switch at the top and bottom of the stairs. OTHER FALL PREVENTION TIPS  Wear low-heel or rubber-soled shoes that are supportive and fit well. Wear closed toe shoes.  When using a stepladder, make sure it is fully opened and both spreaders are firmly locked. Do not climb a closed stepladder.  Add color or contrast paint or tape to grab bars and handrails in your home. Place contrasting color strips on first and last steps.  Learn and use mobility aids as needed. Install an electrical emergency response system.  Turn on lights to avoid dark areas. Replace light bulbs that burn out immediately. Get light switches that glow.  Arrange furniture to create clear pathways. Keep furniture in the same place.  Firmly attach carpet with non-skid or double-sided tape.  Eliminate uneven floor surfaces.  Select a carpet pattern that does not visually hide the edge of steps.  Be aware of all pets. OTHER HOME SAFETY TIPS  Set the water temperature for 120 F (48.8 C).  Keep emergency numbers on or near the telephone.  Keep smoke detectors on every level of the home and near sleeping areas. Document Released: 12/21/2001 Document Revised: 07/02/2011 Document Reviewed: 03/22/2011 [facility] Patient Information 2015 [place], [place]. This information is not intended to replace advice given to you by your health care provider. Make sure you discuss any questions you have with your health care provider.

## 2014-02-09 NOTE — Progress Notes (Signed)
Patient ID: KAWTHAR ENNEN, female   DOB: 1925-05-05, 81 y.o.   MRN: [id number]  Subjective:    [name] is a 81 y.o. female who presents for Medicare Initial Wellness Visit and follow up osteoporosis and review DEXA  Preventive Screening-Counseling & Management  Tobacco History  Smoking status  . Never Smoker   Smokeless tobacco  . Never Used    Current Problems (verified) Patient Active Problem List   Diagnosis Date Noted  . Chronic cystitis 01/08/2013  . Vitamin D deficiency 12/31/2012  . Osteoporosis 12/31/2012  . Spinal stenosis of lumbar region 12/31/2012  . Hyperlipidemia 07/16/2012  . Chronic low back pain 07/16/2012  . Vitamin B12 deficient megaloblastic anemia 07/16/2012    Medications Prior to Visit Current Outpatient Prescriptions on File Prior to Visit  Medication Sig Dispense Refill  . alendronate (FOSAMAX) 35 MG tablet TAKE 1 TABLET EVERY 7 DAYS 12 tablet 1  . calcium carbonate (OS-CAL) 600 MG TABS tablet Take 600 mg by mouth daily with breakfast.    . fluticasone (FLONASE) 50 MCG/ACT nasal spray Place 2 sprays into both nostrils daily. 16 g 6  . Multiple Vitamins-Minerals (CENTRUM SILVER) tablet Take 1 tablet by mouth daily.      . simvastatin (ZOCOR) 80 MG tablet TAKE ONE TABLET DAILY AT BEDTIME 30 tablet 3   No current facility-administered medications on file prior to visit.    Current Medications (verified) Current Outpatient Prescriptions  Medication Sig Dispense Refill  . alendronate (FOSAMAX) 35 MG tablet TAKE 1 TABLET EVERY 7 DAYS 12 tablet 1  . calcium carbonate (OS-CAL) 600 MG TABS tablet Take 600 mg by mouth daily with breakfast.    . cholecalciferol (VITAMIN D) 1000 UNITS tablet Take 1,000 Units by mouth daily.    . fluticasone (FLONASE) 50 MCG/ACT nasal spray Place 2 sprays into both nostrils daily. 16 g 6  . Multiple Vitamins-Minerals (CENTRUM SILVER) tablet Take 1 tablet by mouth daily.      . simvastatin (ZOCOR) 80 MG tablet TAKE ONE  TABLET DAILY AT BEDTIME 30 tablet 3   No current facility-administered medications for this visit.    Allergies (verified) Levaquin; Erythromycin; and Penicillins   PAST HISTORY  Family History Family History  Problem Relation Age of Onset  . Cancer Mother     bone  . Cancer Sister     breast  . Diabetes Brother   . Asthma Daughter   . COPD Son   . Asthma Son   . Cancer Sister     cervical    Social History History  Substance Use Topics  . Smoking status: Never Smoker   . Smokeless tobacco: Never Used  . Alcohol Use: No     Are there smokers in your home (other than you)? No  Risk Factors Current exercise habits: The patient does not participate in regular exercise at present. She continues to stay very active - washes her clothes by hand and gardens  Dietary issues discussed: dietary calcium intake discussed   Cardiac risk factors: advanced age (older than 62 for men, 66 for women), dyslipidemia and sedentary lifestyle.  Depression Screen (Note: if answer to either of the following is "Yes", a more complete depression screening is indicated)   Over the past 2 weeks, have you felt down, depressed or hopeless? Yes - 1 or 2 days this past week due to snow and not being able to get out.  Over the past 2 weeks, have you felt little interest  or pleasure in doing things? No  Have you lost interest or pleasure in daily life? No  Do you often feel hopeless? No   Do you cry easily over simple problems? No  Activities of Daily Living In your present state of health, do you have any difficulty performing the following activities?:  Driving? No Managing money?  No Feeding yourself? No Getting from bed to chair? No Climbing a flight of stairs? No Preparing food and eating?: No - continues to cook Sunday lunch for family and daily meals for self Bathing or showering? No - she does not get in bath tub because she is afraid of falling Getting dressed: No Getting to the  toilet? No Using the toilet:No Moving around from place to place: No In the past year have you fallen or had a near fall?:No   Are you sexually active?  No  Do you have more than one partner?  No  Hearing Difficulties: No Do you often ask people to speak up or repeat themselves? No Do you experience ringing or noises in your ears? No Do you have difficulty understanding soft or whispered voices? No   Do you feel that you have a problem with memory? No  Do you often misplace items? No  Do you feel safe at home?  Yes  Cognitive Testing  Alert? Yes    Normal Appearance?Yes  Oriented to person? Yes    Place? Yes   Time? Yes  Recall of three objects?  Yes  Can perform simple calculations? Yes  Displays appropriate judgment?Yes  Can read the correct time from a watch face?Yes   Advanced Directives have been discussed with the patient? Yes  List the Names of Other Physician/Practitioners you currently use: 1.  Urologist - Dr Jeffie Pollock 2.  Neurologist / Neurosurgeon - Dr Christella Noa 3.  Optomotrist - Dr Ronnald Collum any recent Medical Services you may have received from other than [facility] providers in the past year (date may be approximate).  Immunization History  Administered Date(s) Administered  . Influenza Split 10/28/2012  . Influenza Whole 09/14/2009  . Influenza,inj,quad, With Preservative 11/10/2013  . Pneumococcal Conjugate-13 12/31/2012  . Pneumococcal Polysaccharide-23 10/14/1996  . Td 10/15/2002  . Zoster 02/05/2010    Screening Tests Health Maintenance  Topic Date Due  . COLONOSCOPY  11/11/2015 (Originally 07/02/1977)  . INFLUENZA VACCINE  08/15/2014  . TETANUS/TDAP  03/14/2020  . DEXA SCAN  Completed  . PNEUMOCOCCAL POLYSACCHARIDE VACCINE AGE 57 AND OVER  Completed  . ZOSTAVAX  Completed   Colonoscopy no longer required but no recent FOBT.  All answers were reviewed with the patient and necessary referrals were made:  [name], [facility]   02/09/2014    History reviewed: allergies, current medications, past family history, past medical history, past social history, past surgical history and problem list      Objective:   Body mass index is 29.17 kg/(m^2). BP 104/70 mmHg  Pulse 76  Ht 4' 10.5" (1.486 m)  Wt 142 lb (64.411 kg)  BMI 29.17 kg/m2   Calcium Assessment Calcium Intake  # of servings/day  Calcium mg  Milk (8 oz) 1  x  300  = 300mg   Yogurt (4 oz) 0 x  200 = 0  Cheese (1 oz) 0 x  200 = 0  Other Calcium sources   250mg   Ca supplement 600mg  qam and mvi qam = 1000mg    Estimated calcium intake per day 1550mg     DEXA Results Date of  Test T-Score for AP Spine L1-L4 T-Score for Total Left Hip T-Score for Total Right Hip  02/09/2014 -2.5 -1.7 -1.7  02/20/2011 -2.4 -1.5 -1.5  02/01/2009 -2.8 -1.4 -1.4  12/08/2006 -2.1 -1.0 -1.2     Assessment:   Osteoporosis - decreased BMD compared to 3 years ago Initial Medicare Wellness Visit    Plan:     During the course of the visit the patient was educated and counseled about appropriate screening and preventive services including:    Pneumococcal vaccine - UTD  Influenza vaccine - UTD  Td vaccine - UTD  Screening mammography - UTD due 06/2014 - appt made  Bone densitometry screening - done today  Colorectal cancer screening - FOBT given in office today  Diabetes screening - UTD  Glaucoma screening - due to recheck - patient already has appt with Dr Hassell Done  Advanced directives: has an advanced directive - a copy HAS NOT been provided.  Increase alendronate to 70mg  1 tablet weekly  continue calcium 1200mg  daily through supplementation or diet. Recommended that she separate calcium and MVI.     recommend weight bearing exercise - 30 minutes at least 4 days per week.    Counseled and educated about fall risk and prevention.  Consider grab bar in shower / bath  Recheck DEXA:  2 years  Time spent counseling patient:  60 minutes  Patient Instructions (the  written plan) was given to the patient.  Medicare Attestation I have personally reviewed: The patient's medical and social history Their use of alcohol, tobacco or illicit drugs Their current medications and supplements The patient's functional ability including ADLs,fall risks, home safety risks, cognitive, and hearing and visual impairment Diet and physical activities Evidence for depression or mood disorders  The patient's weight, height, BMI, and HR/BP have been recorded in the chart.  I have made referrals, counseling, and provided education to the patient based on review of the above and I have provided the patient with a written personalized care plan for preventive services.     [name], [facility]   02/09/2014

## 2014-02-10 ENCOUNTER — Telehealth: Payer: Self-pay | Admitting: Family Medicine

## 2014-02-10 NOTE — Telephone Encounter (Signed)
Patient is requesting urine results. [name] can you please check on this

## 2014-02-11 ENCOUNTER — Other Ambulatory Visit: Payer: Self-pay | Admitting: Nurse Practitioner

## 2014-02-11 DIAGNOSIS — N3000 Acute cystitis without hematuria: Secondary | ICD-10-CM

## 2014-02-11 LAB — POCT URINALYSIS DIPSTICK
Bilirubin, UA: NEGATIVE
Glucose, UA: NEGATIVE
Ketones, UA: NEGATIVE
Nitrite, UA: NEGATIVE
PH UA: 5
Protein, UA: NEGATIVE
SPEC GRAV UA: 1.02
Urobilinogen, UA: NEGATIVE

## 2014-02-11 LAB — POCT UA - MICROSCOPIC ONLY
CASTS, UR, LPF, POC: NEGATIVE
Crystals, Ur, HPF, POC: NEGATIVE
Mucus, UA: NEGATIVE
YEAST UA: NEGATIVE

## 2014-02-11 MED ORDER — SULFAMETHOXAZOLE-TRIMETHOPRIM 800-160 MG PO TABS: 1.0000 | ORAL_TABLET | Freq: Two times a day (BID) | ORAL | Status: DC

## 2014-02-11 NOTE — Addendum Note (Signed)
Addended by: Earlene Plater on: 02/11/2014 12:44 PM   Modules accepted: Orders

## 2014-02-11 NOTE — Addendum Note (Signed)
Addended by: Earlene Plater on: 02/11/2014 09:04 AM   Modules accepted: Orders

## 2014-02-13 LAB — URINE CULTURE

## 2014-02-14 ENCOUNTER — Telehealth: Payer: Self-pay | Admitting: Nurse Practitioner

## 2014-02-14 DIAGNOSIS — H40013 Open angle with borderline findings, low risk, bilateral: Secondary | ICD-10-CM | POA: Diagnosis not present

## 2014-02-14 DIAGNOSIS — H02109 Unspecified ectropion of unspecified eye, unspecified eyelid: Secondary | ICD-10-CM | POA: Diagnosis not present

## 2014-02-14 DIAGNOSIS — Z961 Presence of intraocular lens: Secondary | ICD-10-CM | POA: Diagnosis not present

## 2014-02-14 DIAGNOSIS — H524 Presbyopia: Secondary | ICD-10-CM | POA: Diagnosis not present

## 2014-02-14 NOTE — Telephone Encounter (Signed)
i called daughter and she will cont antibiotic

## 2014-02-16 NOTE — Telephone Encounter (Signed)
appt made for her mammogram for Mon 07/04/14 at 10:30a.  rs

## 2014-02-17 ENCOUNTER — Encounter: Payer: Self-pay | Admitting: Family Medicine

## 2014-02-17 ENCOUNTER — Ambulatory Visit (INDEPENDENT_AMBULATORY_CARE_PROVIDER_SITE_OTHER): Payer: Medicare Other | Admitting: Family Medicine

## 2014-02-17 VITALS — BP 140/69 | HR 66 | Temp 97.0°F | Ht 58.5 in | Wt 139.0 lb

## 2014-02-17 DIAGNOSIS — G933 Postviral fatigue syndrome: Secondary | ICD-10-CM | POA: Diagnosis not present

## 2014-02-17 DIAGNOSIS — M810 Age-related osteoporosis without current pathological fracture: Secondary | ICD-10-CM

## 2014-02-17 DIAGNOSIS — N390 Urinary tract infection, site not specified: Secondary | ICD-10-CM | POA: Diagnosis not present

## 2014-02-17 DIAGNOSIS — G9331 Postviral fatigue syndrome: Secondary | ICD-10-CM

## 2014-02-17 DIAGNOSIS — Z1212 Encounter for screening for malignant neoplasm of rectum: Secondary | ICD-10-CM | POA: Diagnosis not present

## 2014-02-17 DIAGNOSIS — E785 Hyperlipidemia, unspecified: Secondary | ICD-10-CM | POA: Diagnosis not present

## 2014-02-17 DIAGNOSIS — E559 Vitamin D deficiency, unspecified: Secondary | ICD-10-CM

## 2014-02-17 LAB — POCT URINALYSIS DIPSTICK
Bilirubin, UA: NEGATIVE
Glucose, UA: NEGATIVE
KETONES UA: NEGATIVE
Nitrite, UA: NEGATIVE
PH UA: 6
Protein, UA: NEGATIVE
Spec Grav, UA: >=1.03
UROBILINOGEN UA: NEGATIVE

## 2014-02-17 LAB — POCT CBC
GRANULOCYTE PERCENT: 69.6 % (ref 37–80)
HCT, POC: 43.2 % (ref 37.7–47.9)
Hemoglobin: 13 g/dL (ref 12.2–16.2)
LYMPH, POC: 1.6 (ref 0.6–3.4)
MCH: 25.8 pg — AB (ref 27–31.2)
MCHC: 30 g/dL — AB (ref 31.8–35.4)
MCV: 85.9 fL (ref 80–97)
MPV: 8.7 fL (ref 0–99.8)
POC GRANULOCYTE: 4 (ref 2–6.9)
POC LYMPH PERCENT: 27.2 %L (ref 10–50)
Platelet Count, POC: 275 10*3/uL (ref 142–424)
RBC: 5 M/uL (ref 4.04–5.48)
RDW, POC: 13.5 %
WBC: 5.8 10*3/uL (ref 4.6–10.2)

## 2014-02-17 LAB — POCT UA - MICROSCOPIC ONLY
Bacteria, U Microscopic: NEGATIVE
Casts, Ur, LPF, POC: NEGATIVE
Mucus, UA: NEGATIVE
Yeast, UA: NEGATIVE

## 2014-02-17 NOTE — Addendum Note (Signed)
Addended by: Earlene Plater on: 02/17/2014 03:46 PM   Modules accepted: Orders

## 2014-02-17 NOTE — Progress Notes (Signed)
Subjective:    Patient ID: [name], female    DOB: [birth year], 81 y.o.   MRN: [id number]  HPI Pt here for follow up and management of chronic medical problems which include hyperlipidemia and osteoporosis. She is taking medications regularly. She has had recent UTI and is still on Bactrim. In the patient is doing well with her bladder symptoms and she will get a urine today and as long as she is not symptomatic no more treatment will be necessary. Several weeks ago she did have a GI virus and this seemed to poor her down a lot and she feels like now she is just recovering from that and in addition to that she has had a urinary tract infection and has not yet completed all the antibiotic for that. Initially with urinary tract infection she did have symptoms now she is not having any symptoms with that. She is just weak and tired.          Patient Active Problem List   Diagnosis Date Noted  . Chronic cystitis 01/08/2013  . Vitamin D deficiency 12/31/2012  . Osteoporosis 12/31/2012  . Spinal stenosis of lumbar region 12/31/2012  . Hyperlipidemia 07/16/2012  . Chronic low back pain 07/16/2012  . Vitamin B12 deficient megaloblastic anemia 07/16/2012   Outpatient Encounter Prescriptions as of 02/17/2014  Medication Sig  . alendronate (FOSAMAX) 70 MG tablet Take 1 tablet (70 mg total) by mouth every 7 (seven) days. Take with a full glass of water on an empty stomach.  . calcium carbonate (OS-CAL) 600 MG TABS tablet Take 600 mg by mouth daily with breakfast.  . cholecalciferol (VITAMIN D) 1000 UNITS tablet Take 1,000 Units by mouth daily.  . fluticasone (FLONASE) 50 MCG/ACT nasal spray Place 2 sprays into both nostrils daily.  . Multiple Vitamins-Minerals (CENTRUM SILVER) tablet Take 1 tablet by mouth every evening.  . nitrofurantoin (MACRODANTIN) 100 MG capsule   . simvastatin (ZOCOR) 80 MG tablet TAKE ONE TABLET DAILY AT BEDTIME  . sulfamethoxazole-trimethoprim (BACTRIM DS,SEPTRA  DS) 800-160 MG per tablet Take 1 tablet by mouth 2 (two) times daily.     Review of Systems  Constitutional: Positive for fatigue (recent virus and uti).  HENT: Negative.   Eyes: Negative.   Respiratory: Negative.   Cardiovascular: Negative.   Gastrointestinal: Negative.   Endocrine: Negative.   Genitourinary: Negative.   Musculoskeletal: Negative.   Skin: Negative.   Allergic/Immunologic: Negative.   Neurological: Negative.   Hematological: Negative.   Psychiatric/Behavioral: Negative.        Objective:   Physical Exam  Constitutional: She is oriented to person, place, and time. She appears well-developed and well-nourished. No distress.  Elderly but alert kyphotic  HENT:  Head: Normocephalic and atraumatic.  Right Ear: External ear normal.  Left Ear: External ear normal.  Nose: Nose normal.  Mouth/Throat: Oropharynx is clear and moist.  Eyes: Conjunctivae and EOM are normal. Pupils are equal, round, and reactive to light. Right eye exhibits no discharge. Left eye exhibits no discharge. No scleral icterus.  Neck: Normal range of motion. Neck supple. No JVD present. No thyromegaly present.  No thyromegaly or anterior cervical adenopathy  Cardiovascular: Normal rate, regular rhythm, normal heart sounds and intact distal pulses.   No murmur heard. At 72/m  Pulmonary/Chest: Effort normal and breath sounds normal. No respiratory distress. She has no wheezes. She has no rales. She exhibits no tenderness.  Abdominal: Soft. Bowel sounds are normal. She exhibits no mass. There is no  tenderness. There is no rebound and no guarding.  No suprapubic tenderness  Musculoskeletal: Normal range of motion. She exhibits no edema or tenderness.  Lymphadenopathy:    She has no cervical adenopathy.  Neurological: She is alert and oriented to person, place, and time. She has normal reflexes. No cranial nerve deficit.  Skin: Skin is warm and dry. No rash noted.  Psychiatric: She has a normal  mood and affect. Her behavior is normal. Judgment and thought content normal.  Nursing note and vitals reviewed.  BP 140/69 mmHg  Pulse 66  Temp(Src) 97 F (36.1 C) (Oral)  Ht 4' 10.5" (1.486 m)  Wt 139 lb (63.05 kg)  BMI 28.55 kg/m2        Assessment & Plan:  1. Recurrent UTI -We will call you with the results of the urine is sent as those results become available - POCT CBC - POCT UA - Microscopic Only - POCT urinalysis dipstick - Urine culture  2. Vitamin D deficiency See future lab work for any changes in treatment of this problem - POCT CBC - Vit D  25 hydroxy (rtn osteoporosis monitoring)  3. Hyperlipidemia -Continue simvastatin pending lab work results and any changes will be made when those results are available - POCT CBC - NMR, lipoprofile - BMP8+EGFR - Hepatic function panel  4. Osteoporosis -Continue to be careful and do not put yourself at risk for falling, also continue with alendronate and take it as directed on an increased some of the first thing with arising every morning - POCT CBC  5. Postviral fatigue syndrome -Continue to drink plenty of fluids and because of the weakness that is getting better, continue to be careful do not put yourself at risk for falling.  Patient Instructions                       Medicare Annual Wellness Visit  [facility] and the medical providers at [facility] strive to bring you the best medical care.  In doing so we not only want to address your current medical conditions and concerns but also to detect new conditions early and prevent illness, disease and health-related problems.    Medicare offers a yearly Wellness Visit which allows our clinical staff to assess your need for preventative services including immunizations, lifestyle education, counseling to decrease risk of preventable diseases and screening for fall risk and other medical concerns.    This visit is provided free of charge (no  copay) for all Medicare recipients. The clinical pharmacists at [facility] have begun to conduct these Wellness Visits which will also include a thorough review of all your medications.    As you primary medical provider recommend that you make an appointment for your Annual Wellness Visit if you have not done so already this year.  You may set up this appointment before you leave today or you may call back ([phone]) and schedule an appointment.  Please make sure when you call that you mention that you are scheduling your Annual Wellness Visit with the clinical pharmacist so that the appointment may be made for the proper length of time.     Continue current medications. Continue good therapeutic lifestyle changes which include good diet and exercise. Fall precautions discussed with patient. If an FOBT was given today- please return it to our front desk. If you are over 71 years old - you may need Prevnar 36 or the adult Pneumonia vaccine.  Flu Shots are still available at our office. If you still haven't had one please call to set up a nurse visit to get one.   After your visit with [place] today you will receive a survey in the mail or online from [employer] regarding your care with [place]. Please take a moment to fill this out. Your feedback is very important to [place] as you can help [place] better understand your patient needs as well as improve your experience and satisfaction. WE CARE ABOUT YOU!!!    Continue pushing fluids Be careful and do not put yourself at risk for falls Finish antibiotic and we will call you with the results of your lab work and urine results as soon as those results become available   Arrie Senate MD

## 2014-02-17 NOTE — Patient Instructions (Addendum)
Medicare Annual Wellness Visit  [facility] and the medical providers at [facility] strive to bring you the best medical care.  In doing so we not only want to address your current medical conditions and concerns but also to detect new conditions early and prevent illness, disease and health-related problems.    Medicare offers a yearly Wellness Visit which allows our clinical staff to assess your need for preventative services including immunizations, lifestyle education, counseling to decrease risk of preventable diseases and screening for fall risk and other medical concerns.    This visit is provided free of charge (no copay) for all Medicare recipients. The clinical pharmacists at [facility] have begun to conduct these Wellness Visits which will also include a thorough review of all your medications.    As you primary medical provider recommend that you make an appointment for your Annual Wellness Visit if you have not done so already this year.  You may set up this appointment before you leave today or you may call back ([phone]) and schedule an appointment.  Please make sure when you call that you mention that you are scheduling your Annual Wellness Visit with the clinical pharmacist so that the appointment may be made for the proper length of time.     Continue current medications. Continue good therapeutic lifestyle changes which include good diet and exercise. Fall precautions discussed with patient. If an FOBT was given today- please return it to our front desk. If you are over 6 years old - you may need Prevnar 29 or the adult Pneumonia vaccine.  Flu Shots are still available at our office. If you still haven't had one please call to set up a nurse visit to get one.   After your visit with [place] today you will receive a survey in the mail or online from [employer] regarding your care with [place]. Please take a moment to  fill this out. Your feedback is very important to [place] as you can help [place] better understand your patient needs as well as improve your experience and satisfaction. WE CARE ABOUT YOU!!!    Continue pushing fluids Be careful and do not put yourself at risk for falls Finish antibiotic and we will call you with the results of your lab work and urine results as soon as those results become available

## 2014-02-18 LAB — HEPATIC FUNCTION PANEL
ALT: 11 IU/L (ref 0–32)
AST: 24 IU/L (ref 0–40)
Albumin: 4.3 g/dL (ref 3.5–4.7)
Alkaline Phosphatase: 81 IU/L (ref 39–117)
BILIRUBIN DIRECT: 0.11 mg/dL (ref 0.00–0.40)
TOTAL PROTEIN: 7.1 g/dL (ref 6.0–8.5)
Total Bilirubin: 0.3 mg/dL (ref 0.0–1.2)

## 2014-02-18 LAB — VITAMIN D 25 HYDROXY (VIT D DEFICIENCY, FRACTURES): VIT D 25 HYDROXY: 41.4 ng/mL (ref 30.0–100.0)

## 2014-02-18 LAB — NMR, LIPOPROFILE
Cholesterol: 173 mg/dL (ref 100–199)
HDL CHOLESTEROL BY NMR: 71 mg/dL (ref >39)
HDL Particle Number: 39.9 umol/L (ref >=30.5)
LDL PARTICLE NUMBER: 742 nmol/L (ref <1000)
LDL Size: 20.4 nm (ref >20.5)
LDL-C: 80 mg/dL (ref 0–99)
LP-IR Score: 39 (ref <=45)
Small LDL Particle Number: 458 nmol/L (ref <=527)
Triglycerides by NMR: 110 mg/dL (ref 0–149)

## 2014-02-18 LAB — BMP8+EGFR
BUN/Creatinine Ratio: 12 (ref 11–26)
BUN: 12 mg/dL (ref 8–27)
CHLORIDE: 100 mmol/L (ref 97–108)
CO2: 25 mmol/L (ref 18–29)
Calcium: 10 mg/dL (ref 8.7–10.3)
Creatinine, Ser: 0.97 mg/dL (ref 0.57–1.00)
GFR calc Af Amer: 61 mL/min/{1.73_m2} (ref >59)
GFR, EST NON AFRICAN AMERICAN: 53 mL/min/{1.73_m2} — AB (ref >59)
Glucose: 103 mg/dL — ABNORMAL HIGH (ref 65–99)
POTASSIUM: 5.4 mmol/L — AB (ref 3.5–5.2)
Sodium: 141 mmol/L (ref 134–144)

## 2014-02-19 LAB — URINE CULTURE

## 2014-02-21 ENCOUNTER — Telehealth: Payer: Self-pay | Admitting: Family Medicine

## 2014-02-21 LAB — FECAL OCCULT BLOOD, IMMUNOCHEMICAL: Fecal Occult Bld: NEGATIVE

## 2014-02-22 ENCOUNTER — Telehealth: Payer: Self-pay | Admitting: Family Medicine

## 2014-02-22 NOTE — Telephone Encounter (Signed)
Aware of lab results for her mother.  New script for UTI sent to [facility] and must repeat urine and potassium level in two weeks.

## 2014-02-22 NOTE — Telephone Encounter (Signed)
Patient's daughter aware of results and need for repeat urine and potassium level.  New script of antibiotics for UTI sent to pharmacy.

## 2014-02-22 NOTE — Telephone Encounter (Signed)
Spoke with daughter about medications

## 2014-03-03 ENCOUNTER — Other Ambulatory Visit (INDEPENDENT_AMBULATORY_CARE_PROVIDER_SITE_OTHER): Payer: Medicare Other

## 2014-03-03 DIAGNOSIS — N390 Urinary tract infection, site not specified: Secondary | ICD-10-CM | POA: Diagnosis not present

## 2014-03-03 DIAGNOSIS — E875 Hyperkalemia: Secondary | ICD-10-CM

## 2014-03-03 NOTE — Progress Notes (Signed)
Lab only 

## 2014-03-03 NOTE — Addendum Note (Signed)
Addended by: Earlene Plater on: 03/03/2014 10:46 AM   Modules accepted: Orders

## 2014-03-04 LAB — BMP8+EGFR
BUN/Creatinine Ratio: 13 (ref 11–26)
BUN: 9 mg/dL (ref 8–27)
CO2: 26 mmol/L (ref 18–29)
Calcium: 9.6 mg/dL (ref 8.7–10.3)
Chloride: 101 mmol/L (ref 97–108)
Creatinine, Ser: 0.67 mg/dL (ref 0.57–1.00)
GFR calc Af Amer: 92 mL/min/{1.73_m2} (ref >59)
GFR calc non Af Amer: 80 mL/min/{1.73_m2} (ref >59)
Glucose: 95 mg/dL (ref 65–99)
POTASSIUM: 5.2 mmol/L (ref 3.5–5.2)
SODIUM: 141 mmol/L (ref 134–144)

## 2014-03-05 LAB — URINE CULTURE

## 2014-03-11 ENCOUNTER — Ambulatory Visit (INDEPENDENT_AMBULATORY_CARE_PROVIDER_SITE_OTHER): Payer: Medicare Other | Admitting: Urology

## 2014-03-11 DIAGNOSIS — N3946 Mixed incontinence: Secondary | ICD-10-CM | POA: Diagnosis not present

## 2014-03-11 DIAGNOSIS — N3020 Other chronic cystitis without hematuria: Secondary | ICD-10-CM | POA: Diagnosis not present

## 2014-06-02 DIAGNOSIS — D2371 Other benign neoplasm of skin of right lower limb, including hip: Secondary | ICD-10-CM | POA: Diagnosis not present

## 2014-06-02 DIAGNOSIS — M79672 Pain in left foot: Secondary | ICD-10-CM | POA: Diagnosis not present

## 2014-06-08 ENCOUNTER — Telehealth: Payer: Self-pay | Admitting: Family Medicine

## 2014-06-08 NOTE — Telephone Encounter (Signed)
Appointment given for tomorrow at [facility] with Laurance Flatten

## 2014-06-09 ENCOUNTER — Ambulatory Visit (INDEPENDENT_AMBULATORY_CARE_PROVIDER_SITE_OTHER): Payer: Medicare Other

## 2014-06-09 ENCOUNTER — Encounter: Payer: Self-pay | Admitting: Family Medicine

## 2014-06-09 ENCOUNTER — Other Ambulatory Visit: Payer: Self-pay | Admitting: Family Medicine

## 2014-06-09 ENCOUNTER — Ambulatory Visit (INDEPENDENT_AMBULATORY_CARE_PROVIDER_SITE_OTHER): Payer: Medicare Other | Admitting: Family Medicine

## 2014-06-09 VITALS — BP 116/52 | HR 65 | Temp 97.3°F | Ht 58.5 in | Wt 138.0 lb

## 2014-06-09 DIAGNOSIS — J209 Acute bronchitis, unspecified: Secondary | ICD-10-CM

## 2014-06-09 DIAGNOSIS — R05 Cough: Secondary | ICD-10-CM | POA: Diagnosis not present

## 2014-06-09 DIAGNOSIS — R059 Cough, unspecified: Secondary | ICD-10-CM

## 2014-06-09 DIAGNOSIS — J069 Acute upper respiratory infection, unspecified: Secondary | ICD-10-CM | POA: Diagnosis not present

## 2014-06-09 LAB — POCT CBC
Granulocyte percent: 84.3 %G — AB (ref 37–80)
HEMATOCRIT: 39.8 % (ref 37.7–47.9)
Hemoglobin: 12.9 g/dL (ref 12.2–16.2)
LYMPH, POC: 1 (ref 0.6–3.4)
MCH, POC: 27.8 pg (ref 27–31.2)
MCHC: 32.3 g/dL (ref 31.8–35.4)
MCV: 85.9 fL (ref 80–97)
MPV: 8.1 fL (ref 0–99.8)
POC GRANULOCYTE: 9.4 — AB (ref 2–6.9)
POC LYMPH %: 8.6 % — AB (ref 10–50)
Platelet Count, POC: 275 10*3/uL (ref 142–424)
RBC: 4.63 M/uL (ref 4.04–5.48)
RDW, POC: 12.5 %
WBC: 11.1 10*3/uL — AB (ref 4.6–10.2)

## 2014-06-09 MED ORDER — METHYLPREDNISOLONE ACETATE 80 MG/ML IJ SUSP
60.0000 mg | Freq: Once | INTRAMUSCULAR | Status: AC
  Administered 2014-06-09: 60 mg via INTRAMUSCULAR

## 2014-06-09 MED ORDER — AZITHROMYCIN 250 MG PO TABS: ORAL_TABLET | ORAL | Status: DC

## 2014-06-09 NOTE — Addendum Note (Signed)
Addended by: Zannie Cove on: 06/09/2014 11:06 AM   Modules accepted: Orders

## 2014-06-09 NOTE — Progress Notes (Signed)
Subjective:    Patient ID: [name], female    DOB: [birth year], 81 y.o.   MRN: [id number]  HPI Patient here today for cough, sinus and fever that started Monday. She is also had chills with decreased appetite. Her daughter sends in a note that she is unable to take penicillin or Mucinex but is currently taking Delsym every 12 hours for cough. The patient has been running a low-grade fever. She says the cough is a dry cough and that she feels very congested in her head. She is alert and communicative and was feeling well until all of this started 3 days ago. The daughter send a note and this will be scanned into the record. The patient comes to the visit with her son-in-law and the findings found during the visit and the results of the chest x-ray preliminary, and CBC will were reported to him to give to his wife.      Patient Active Problem List   Diagnosis Date Noted  . Chronic cystitis 01/08/2013  . Vitamin D deficiency 12/31/2012  . Osteoporosis 12/31/2012  . Spinal stenosis of lumbar region 12/31/2012  . Hyperlipidemia 07/16/2012  . Chronic low back pain 07/16/2012  . Vitamin B12 deficient megaloblastic anemia 07/16/2012   Outpatient Encounter Prescriptions as of 06/09/2014  Medication Sig  . alendronate (FOSAMAX) 70 MG tablet Take 1 tablet (70 mg total) by mouth every 7 (seven) days. Take with a full glass of water on an empty stomach.  . calcium carbonate (OS-CAL) 600 MG TABS tablet Take 600 mg by mouth daily with breakfast.  . cholecalciferol (VITAMIN D) 1000 UNITS tablet Take 1,000 Units by mouth daily.  . fluticasone (FLONASE) 50 MCG/ACT nasal spray Place 2 sprays into both nostrils daily.  . Multiple Vitamins-Minerals (CENTRUM SILVER) tablet Take 1 tablet by mouth every evening.  . nitrofurantoin (MACRODANTIN) 100 MG capsule Take 100 mg by mouth at bedtime.  . simvastatin (ZOCOR) 80 MG tablet TAKE ONE TABLET DAILY AT BEDTIME  . [DISCONTINUED]  sulfamethoxazole-trimethoprim (BACTRIM DS,SEPTRA DS) 800-160 MG per tablet Take 1 tablet by mouth 2 (two) times daily.   No facility-administered encounter medications on file as of 06/09/2014.      Review of Systems  Constitutional: Positive for fever, chills and appetite change (decreased).  HENT: Positive for congestion.   Eyes: Negative.   Respiratory: Positive for cough.   Cardiovascular: Negative.   Gastrointestinal: Negative.   Endocrine: Negative.   Genitourinary: Negative.   Musculoskeletal: Negative.   Skin: Negative.   Allergic/Immunologic: Negative.   Neurological: Negative.   Hematological: Negative.   Psychiatric/Behavioral: Negative.        Objective:   Physical Exam  Constitutional: She is oriented to person, place, and time. No distress.  Elderly but pleasant and alert  HENT:  Head: Normocephalic and atraumatic.  Right Ear: External ear normal.  Left Ear: External ear normal.  Mouth/Throat: Oropharynx is clear and moist.  Nasal pallor  Eyes: Conjunctivae and EOM are normal. Pupils are equal, round, and reactive to light. Right eye exhibits no discharge. Left eye exhibits no discharge. No scleral icterus.  Neck: Normal range of motion. Neck supple. No thyromegaly present.  Cardiovascular: Normal rate, regular rhythm and normal heart sounds.   No murmur heard. Pulmonary/Chest: Effort normal and breath sounds normal. No respiratory distress. She has no wheezes. She has no rales. She exhibits no tenderness.  Dry cough  Abdominal: She exhibits no mass.  Musculoskeletal: Normal range of motion. She exhibits  no edema.  Kyphotic posture  Lymphadenopathy:    She has no cervical adenopathy.  Neurological: She is alert and oriented to person, place, and time.  Skin: Skin is warm and dry. No rash noted.  Psychiatric: She has a normal mood and affect. Her behavior is normal. Judgment and thought content normal.  Nursing note and vitals reviewed.  BP 116/52 mmHg   Pulse 65  Temp(Src) 97.3 F (36.3 C) (Oral)  Ht 4' 10.5" (1.486 m)  Wt 138 lb (62.596 kg)  BMI 28.35 kg/m2  [facility] reading (PRIMARY) by  Dr. Wonda Horner significant change apparent from previous chest x-ray with patchy infiltrate in the right proximal bronchial area noted then and noted again today.  Marland Kitchen Results for orders placed or performed in visit on 06/09/14  POCT CBC  Result Value Ref Range   WBC 11.1 (A) 4.6 - 10.2 K/uL   Lymph, poc 1.0 0.6 - 3.4   POC LYMPH PERCENT 8.6 (A) 10 - 50 %L   POC Granulocyte 9.4 (A) 2 - 6.9   Granulocyte percent 84.3 (A) 37 - 80 %G   RBC 4.63 4.04 - 5.48 M/uL   Hemoglobin 12.9 12.2 - 16.2 g/dL   HCT, POC 39.8 37.7 - 47.9 %   MCV 85.9 80 - 97 fL   MCH, POC 27.8 27 - 31.2 pg   MCHC 32.3 31.8 - 35.4 g/dL   RDW, POC 12.5 %   Platelet Count, POC 275 142 - 424 K/uL   MPV 8.1 0 - 99.8 fL   The patient was informed of the above results before she left the office.                                        Assessment & Plan:  1. Cough -Continue Delsym and drink plenty of fluids - POCT CBC - DG Chest 2 View; Future - BMP8+EGFR  2. Acute bronchitis, unspecified organism -Take antibiotic as directed - azithromycin (ZITHROMAX) 250 MG tablet; 2 pills the first day then one daily for infection until completed  Dispense: 6 tablet; Refill: 0  3. URI, acute -Use nasal saline 3-4 times daily each nostril and continue to use Flonase at nighttime - azithromycin (ZITHROMAX) 250 MG tablet; 2 pills the first day then one daily for infection until completed  Dispense: 6 tablet; Refill: 0  Patient Instructions  Continue to drink plenty of fluid  take Tylenol for aches pains and fever Take antibiotic as directed Continue to take Delsym for cough every 12 hours Use nasal saline frequently through the day and continue with Flonase nasal spray at nighttime   Arrie Senate MD

## 2014-06-09 NOTE — Patient Instructions (Signed)
Continue to drink plenty of fluid  take Tylenol for aches pains and fever Take antibiotic as directed Continue to take Delsym for cough every 12 hours Use nasal saline frequently through the day and continue with Flonase nasal spray at nighttime

## 2014-06-10 LAB — BMP8+EGFR
BUN/Creatinine Ratio: 15 (ref 11–26)
BUN: 11 mg/dL (ref 8–27)
CALCIUM: 9.5 mg/dL (ref 8.7–10.3)
CO2: 26 mmol/L (ref 18–29)
CREATININE: 0.75 mg/dL (ref 0.57–1.00)
Chloride: 101 mmol/L (ref 97–108)
GFR calc Af Amer: 83 mL/min/{1.73_m2} (ref >59)
GFR, EST NON AFRICAN AMERICAN: 72 mL/min/{1.73_m2} (ref >59)
Glucose: 127 mg/dL — ABNORMAL HIGH (ref 65–99)
POTASSIUM: 5 mmol/L (ref 3.5–5.2)
SODIUM: 142 mmol/L (ref 134–144)

## 2014-06-22 ENCOUNTER — Encounter: Payer: Self-pay | Admitting: Family Medicine

## 2014-06-22 ENCOUNTER — Ambulatory Visit (INDEPENDENT_AMBULATORY_CARE_PROVIDER_SITE_OTHER): Payer: Medicare Other | Admitting: Family Medicine

## 2014-06-22 VITALS — BP 144/66 | HR 58 | Temp 97.8°F | Ht 58.5 in | Wt 135.0 lb

## 2014-06-22 DIAGNOSIS — D531 Other megaloblastic anemias, not elsewhere classified: Secondary | ICD-10-CM | POA: Diagnosis not present

## 2014-06-22 DIAGNOSIS — E785 Hyperlipidemia, unspecified: Secondary | ICD-10-CM | POA: Diagnosis not present

## 2014-06-22 DIAGNOSIS — M810 Age-related osteoporosis without current pathological fracture: Secondary | ICD-10-CM

## 2014-06-22 DIAGNOSIS — G8929 Other chronic pain: Secondary | ICD-10-CM | POA: Diagnosis not present

## 2014-06-22 DIAGNOSIS — R05 Cough: Secondary | ICD-10-CM

## 2014-06-22 DIAGNOSIS — M545 Low back pain: Secondary | ICD-10-CM

## 2014-06-22 DIAGNOSIS — M48061 Spinal stenosis, lumbar region without neurogenic claudication: Secondary | ICD-10-CM

## 2014-06-22 DIAGNOSIS — R300 Dysuria: Secondary | ICD-10-CM

## 2014-06-22 DIAGNOSIS — E559 Vitamin D deficiency, unspecified: Secondary | ICD-10-CM | POA: Diagnosis not present

## 2014-06-22 DIAGNOSIS — M4806 Spinal stenosis, lumbar region: Secondary | ICD-10-CM

## 2014-06-22 DIAGNOSIS — N3020 Other chronic cystitis without hematuria: Secondary | ICD-10-CM

## 2014-06-22 DIAGNOSIS — R059 Cough, unspecified: Secondary | ICD-10-CM

## 2014-06-22 LAB — POCT UA - MICROSCOPIC ONLY
BACTERIA, U MICROSCOPIC: NEGATIVE
CASTS, UR, LPF, POC: NEGATIVE
Crystals, Ur, HPF, POC: NEGATIVE
RBC, urine, microscopic: NEGATIVE
Yeast, UA: NEGATIVE

## 2014-06-22 LAB — POCT URINALYSIS DIPSTICK
BILIRUBIN UA: NEGATIVE
GLUCOSE UA: NEGATIVE
Ketones, UA: NEGATIVE
Leukocytes, UA: NEGATIVE
NITRITE UA: NEGATIVE
Protein, UA: NEGATIVE
RBC UA: NEGATIVE
Spec Grav, UA: >=1.03
Urobilinogen, UA: NEGATIVE
pH, UA: 5

## 2014-06-22 NOTE — Patient Instructions (Addendum)
Medicare Annual Wellness Visit  [facility] and the medical providers at [facility] strive to bring you the best medical care.  In doing so we not only want to address your current medical conditions and concerns but also to detect new conditions early and prevent illness, disease and health-related problems.    Medicare offers a yearly Wellness Visit which allows our clinical staff to assess your need for preventative services including immunizations, lifestyle education, counseling to decrease risk of preventable diseases and screening for fall risk and other medical concerns.    This visit is provided free of charge (no copay) for all Medicare recipients. The clinical pharmacists at [facility] have begun to conduct these Wellness Visits which will also include a thorough review of all your medications.    As you primary medical provider recommend that you make an appointment for your Annual Wellness Visit if you have not done so already this year.  You may set up this appointment before you leave today or you may call back ([phone]) and schedule an appointment.  Please make sure when you call that you mention that you are scheduling your Annual Wellness Visit with the clinical pharmacist so that the appointment may be made for the proper length of time.    Continue current medications. Continue good therapeutic lifestyle changes which include good diet and exercise. Fall precautions discussed with patient. If an FOBT was given today- please return it to our front desk. If you are over 92 years old - you may need Prevnar 71 or the adult Pneumonia vaccine.  Flu Shots are still available at our office. If you still haven't had one please call to set up a nurse visit to get one.   After your visit with [place] today you will receive a survey in the mail or online from [employer] regarding your care with [place]. Please take a moment to  fill this out. Your feedback is very important to [place] as you can help [place] better understand your patient needs as well as improve your experience and satisfaction. WE CARE ABOUT YOU!!!   She will continue drinking plenty of fluids We will call her with results of the additional lab work being done today as well as the urinalysis. She will continue to take the Mucinex, use the nasal saline and use the Flonase regularly for the next few weeks.

## 2014-06-22 NOTE — Progress Notes (Signed)
Subjective:    Patient ID: [name], female    DOB: 1925/05/19, 81 y.o.   MRN: [id number]  HPI  This is the patient's routine follow-up visit for her chronic medical problems. She was seen recently for bronchitis and she is doing some better with this. She still has some sneezing cough and congestion but is improved. She has completed her course of Zithromax. She did have some blood work drawn at that time and we will review that with her today. She will get additional blood work related to her hyperlipidemia and other medical issues today that was not done previously. With the lab work that was done previously nonfasting her blood sugar was 127, her creatinine was good at 0.75 and her electrolytes were good including potassium. Her CBC had a white blood cell count that was minimally elevated at 11,100. Her hemoglobin was stable at 12.9 and her platelet count was adequate. The patient comes to the visit today with her daughter. She is also having some problems with dysuria and frequency, but she says this is only after she's been riding and sitting for a while and not just during the day. She does have a long history of back problems. I don't know if this is related to that or not. We will get a urine and we will most likely get a culture and sensitivity before any kind of treatment is rendered. She will continue with drinking plenty of fluids and this was discussed during the visit.  Patient Active Problem List   Diagnosis Date Noted  . Chronic cystitis 01/08/2013  . Vitamin D deficiency 12/31/2012  . Osteoporosis 12/31/2012  . Spinal stenosis of lumbar region 12/31/2012  . Hyperlipidemia 07/16/2012  . Chronic low back pain 07/16/2012  . Vitamin B12 deficient megaloblastic anemia 07/16/2012   Outpatient Encounter Prescriptions as of 06/22/2014  Medication Sig  . alendronate (FOSAMAX) 70 MG tablet Take 1 tablet (70 mg total) by mouth every 7 (seven) days. Take with a full glass of water on  an empty stomach.  . calcium carbonate (OS-CAL) 600 MG TABS tablet Take 600 mg by mouth daily with breakfast.  . cholecalciferol (VITAMIN D) 1000 UNITS tablet Take 1,000 Units by mouth daily.  . fluticasone (FLONASE) 50 MCG/ACT nasal spray Place 2 sprays into both nostrils daily.  . Multiple Vitamins-Minerals (CENTRUM SILVER) tablet Take 1 tablet by mouth every evening.  . nitrofurantoin (MACRODANTIN) 100 MG capsule Take 100 mg by mouth at bedtime.  . simvastatin (ZOCOR) 80 MG tablet TAKE ONE TABLET DAILY AT BEDTIME  . [DISCONTINUED] alendronate (FOSAMAX) 35 MG tablet TAKE 1 TABLET EVERY WEEK  . [DISCONTINUED] azithromycin (ZITHROMAX) 250 MG tablet 2 pills the first day then one daily for infection until completed   No facility-administered encounter medications on file as of 06/22/2014.       Review of Systems  Constitutional: Positive for fatigue. Negative for fever and chills.  HENT: Positive for congestion and sneezing. Negative for rhinorrhea and sinus pressure.   Eyes: Positive for discharge. Negative for redness and itching.  Respiratory: Positive for cough (intermittent cough. She is taking cough syrup that was prescribed at recent visit). Negative for chest tightness and wheezing.   Cardiovascular: Negative for chest pain, palpitations and leg swelling.  Gastrointestinal: Negative.   Endocrine: Negative.   Genitourinary: Positive for dysuria. Negative for flank pain.  Musculoskeletal: Negative.   Skin: Negative.   Allergic/Immunologic: Negative.   Neurological: Positive for headaches. Negative for dizziness and light-headedness.  Hematological: Negative.   Psychiatric/Behavioral: Negative.        Objective:   Physical Exam  Constitutional: She is oriented to person, place, and time. She appears well-developed and well-nourished. No distress.  Patient is alert and appears good for her age of 69 years. She is feeling better since she's had this recent bout of bronchitis but  is still coughing some.  HENT:  Head: Normocephalic and atraumatic.  Right Ear: External ear normal.  Left Ear: External ear normal.  Nose: Nose normal.  Mouth/Throat: Oropharynx is clear and moist. No oropharyngeal exudate.  She appears to be well-hydrated and her oral cavity is nice and moist without drainage or purulence. The nasal passages had pallor and were clear of drainage.  Eyes: Conjunctivae and EOM are normal. Pupils are equal, round, and reactive to light. Right eye exhibits no discharge. Left eye exhibits no discharge. No scleral icterus.  Neck: Normal range of motion. Neck supple. No thyromegaly present.  No bruits or anterior cervical adenopathy  Cardiovascular: Normal rate, regular rhythm, normal heart sounds and intact distal pulses.  Exam reveals no gallop and no friction rub.   No murmur heard. Pulmonary/Chest: Effort normal and breath sounds normal. No respiratory distress. She has no wheezes. She has no rales. She exhibits no tenderness.  There was slight congestion and wheezes with deep inhalation but once she coughed all of this went away.  Abdominal: Soft. Bowel sounds are normal. She exhibits no mass. There is no tenderness. There is no rebound and no guarding.  Nontender without masses  Musculoskeletal: Normal range of motion. She exhibits no edema or tenderness.  Lymphadenopathy:    She has no cervical adenopathy.  Neurological: She is alert and oriented to person, place, and time. She has normal reflexes. No cranial nerve deficit.  Skin: Skin is warm and dry. No rash noted.  Psychiatric: She has a normal mood and affect. Her behavior is normal. Judgment and thought content normal.  Nursing note and vitals reviewed.   BP 144/66 mmHg  Pulse 58  Temp(Src) 97.8 F (36.6 C) (Oral)  Ht 4' 10.5" (1.486 m)  Wt 135 lb (61.236 kg)  BMI 27.73 kg/m2        Assessment & Plan:  1. Vitamin D deficiency -The patient will continue her current vitamin D pending  results of lab work - Vit D  25 hydroxy (rtn osteoporosis monitoring)  2. Osteoporosis -She will continue to be careful and did not put yourself at risk for falls and we'll move slowly and watch where she is walking  3. Hyperlipidemia -She will continue with her current treatment pending results of lab work being done today. This includes simvastatin. - NMR, lipoprofile - Hepatic function panel  4. Chronic low back pain -She periodically sees the neurosurgeon for injections in has not had any recently. When she feels it is necessary she calls him and arranges for visits for this.  5. Chronic cystitis -We will check a urine specimen today make sure that there is no sign of any infection and will render treatment per those results.  6. Vitamin B12 deficient megaloblastic anemia  7. Spinal stenosis of lumbar region -Follow up with neurosurgery as needed  8. Dysuria -Reinjured treatment after urinalysis and urine culture are return - POCT UA - Microscopic Only - POCT urinalysis dipstick - Urine culture  9. Cough -She will continue with her Mucinex nasal saline and Flonase for the next 3-4 weeks. She will call us sooner if she  gets any worse.  Patient Instructions                       Medicare Annual Wellness Visit  [facility] and the medical providers at [facility] strive to bring you the best medical care.  In doing so we not only want to address your current medical conditions and concerns but also to detect new conditions early and prevent illness, disease and health-related problems.    Medicare offers a yearly Wellness Visit which allows our clinical staff to assess your need for preventative services including immunizations, lifestyle education, counseling to decrease risk of preventable diseases and screening for fall risk and other medical concerns.    This visit is provided free of charge (no copay) for all Medicare recipients. The clinical  pharmacists at [facility] have begun to conduct these Wellness Visits which will also include a thorough review of all your medications.    As you primary medical provider recommend that you make an appointment for your Annual Wellness Visit if you have not done so already this year.  You may set up this appointment before you leave today or you may call back ([phone]) and schedule an appointment.  Please make sure when you call that you mention that you are scheduling your Annual Wellness Visit with the clinical pharmacist so that the appointment may be made for the proper length of time.    Continue current medications. Continue good therapeutic lifestyle changes which include good diet and exercise. Fall precautions discussed with patient. If an FOBT was given today- please return it to our front desk. If you are over 38 years old - you may need Prevnar 72 or the adult Pneumonia vaccine.  Flu Shots are still available at our office. If you still haven't had one please call to set up a nurse visit to get one.   After your visit with [place] today you will receive a survey in the mail or online from [employer] regarding your care with [place]. Please take a moment to fill this out. Your feedback is very important to [place] as you can help [place] better understand your patient needs as well as improve your experience and satisfaction. WE CARE ABOUT YOU!!!   She will continue drinking plenty of fluids We will call her with results of the additional lab work being done today as well as the urinalysis. She will continue to take the Mucinex, use the nasal saline and use the Flonase regularly for the next few weeks.   Arrie Senate MD

## 2014-06-23 ENCOUNTER — Telehealth: Payer: Self-pay | Admitting: *Deleted

## 2014-06-23 LAB — URINE CULTURE

## 2014-06-23 LAB — NMR, LIPOPROFILE
Cholesterol: 173 mg/dL (ref 100–199)
HDL Cholesterol by NMR: 66 mg/dL (ref >39)
HDL Particle Number: 40 umol/L (ref >=30.5)
LDL Particle Number: 1034 nmol/L — ABNORMAL HIGH (ref <1000)
LDL Size: 21.3 nm (ref >20.5)
LDL-C: 86 mg/dL (ref 0–99)
LP-IR Score: 36 (ref <=45)
Small LDL Particle Number: 568 nmol/L — ABNORMAL HIGH (ref <=527)
Triglycerides by NMR: 105 mg/dL (ref 0–149)

## 2014-06-23 LAB — VITAMIN D 25 HYDROXY (VIT D DEFICIENCY, FRACTURES): Vit D, 25-Hydroxy: 42 ng/mL (ref 30.0–100.0)

## 2014-06-23 LAB — HEPATIC FUNCTION PANEL
ALT: 12 IU/L (ref 0–32)
AST: 22 IU/L (ref 0–40)
Albumin: 4.3 g/dL (ref 3.5–4.7)
Alkaline Phosphatase: 84 IU/L (ref 39–117)
Bilirubin Total: 0.4 mg/dL (ref 0.0–1.2)
Bilirubin, Direct: 0.12 mg/dL (ref 0.00–0.40)
Total Protein: 7 g/dL (ref 6.0–8.5)

## 2014-06-23 NOTE — Telephone Encounter (Signed)
-----   Message from Chipper Herb, MD sent at 06/23/2014  7:44 AM EDT ----- [name] all numbers with advanced lipid testing have a total LDL particle number that is more elevated than it was 4 months ago at 1034. This is just above the goal of 1000. The patient should try to do better with diet and exercise and should can continue with her current dose of simvastatin. The LDL C is good at 86 and the triglycerides are good at 105. All liver function tests are within normal limits The vitamin D level is good at 42.0 and this is consistent with past readings and she should continue with her current treatment The urine culture is still pending but there was no sign of any infection in the urinalysis that was done yesterday.

## 2014-06-23 NOTE — Telephone Encounter (Signed)
Pt notified of results Verbalizes understanding 

## 2014-07-04 DIAGNOSIS — Z1231 Encounter for screening mammogram for malignant neoplasm of breast: Secondary | ICD-10-CM | POA: Diagnosis not present

## 2014-07-05 DIAGNOSIS — Z1231 Encounter for screening mammogram for malignant neoplasm of breast: Secondary | ICD-10-CM | POA: Diagnosis not present

## 2014-07-22 ENCOUNTER — Encounter: Payer: Self-pay | Admitting: Family Medicine

## 2014-09-09 ENCOUNTER — Ambulatory Visit (INDEPENDENT_AMBULATORY_CARE_PROVIDER_SITE_OTHER): Payer: Medicare Other | Admitting: Urology

## 2014-09-09 DIAGNOSIS — N3946 Mixed incontinence: Secondary | ICD-10-CM

## 2014-09-09 DIAGNOSIS — N3020 Other chronic cystitis without hematuria: Secondary | ICD-10-CM | POA: Diagnosis not present

## 2014-10-24 ENCOUNTER — Ambulatory Visit (INDEPENDENT_AMBULATORY_CARE_PROVIDER_SITE_OTHER): Payer: Medicare Other | Admitting: Family Medicine

## 2014-10-24 ENCOUNTER — Encounter: Payer: Self-pay | Admitting: Family Medicine

## 2014-10-24 VITALS — BP 138/55 | HR 68 | Temp 96.8°F | Ht 58.5 in | Wt 140.0 lb

## 2014-10-24 DIAGNOSIS — Z23 Encounter for immunization: Secondary | ICD-10-CM | POA: Diagnosis not present

## 2014-10-24 DIAGNOSIS — E785 Hyperlipidemia, unspecified: Secondary | ICD-10-CM | POA: Diagnosis not present

## 2014-10-24 DIAGNOSIS — D531 Other megaloblastic anemias, not elsewhere classified: Secondary | ICD-10-CM | POA: Diagnosis not present

## 2014-10-24 DIAGNOSIS — M545 Low back pain: Secondary | ICD-10-CM

## 2014-10-24 DIAGNOSIS — G8929 Other chronic pain: Secondary | ICD-10-CM | POA: Diagnosis not present

## 2014-10-24 DIAGNOSIS — R7989 Other specified abnormal findings of blood chemistry: Secondary | ICD-10-CM | POA: Diagnosis not present

## 2014-10-24 DIAGNOSIS — M542 Cervicalgia: Secondary | ICD-10-CM

## 2014-10-24 DIAGNOSIS — M6530 Trigger finger, unspecified finger: Secondary | ICD-10-CM

## 2014-10-24 DIAGNOSIS — E559 Vitamin D deficiency, unspecified: Secondary | ICD-10-CM

## 2014-10-24 DIAGNOSIS — M810 Age-related osteoporosis without current pathological fracture: Secondary | ICD-10-CM

## 2014-10-24 MED ORDER — SIMVASTATIN 80 MG PO TABS: 80.0000 mg | ORAL_TABLET | Freq: Every day | ORAL | Status: DC

## 2014-10-24 NOTE — Progress Notes (Signed)
Subjective:    Patient ID: [name], female    DOB: 1925/12/16, 81 y.o.   MRN: [id number]  HPI Pt here for follow up and management of chronic medical problems which includes hyperlipidemia. She is taking medications regularly. The patient today complains of neck pain and fatigue. She is due to get her flu shot. She has had chronic problems with her low back ache a Zyrtec and requiring steroid injections. She will get lab work today and her flu shot today. The patient comes to the visit today with her daughter. Her biggest complaint today is her neck pain and she says she thinks is comes from bending over and working on some canned goods. She's been taking one Advil a day. It may be some better but is not getting better quick enough. She denies chest pain shortness of breath trouble swallowing heartburn indigestion nausea vomiting diarrhea or blood in the stool. She does complain of some fatigue. She is passing her water without problems. She comes to the visit today with her daughter.      Patient Active Problem List   Diagnosis Date Noted  . Chronic cystitis 01/08/2013  . Vitamin D deficiency 12/31/2012  . Osteoporosis 12/31/2012  . Spinal stenosis of lumbar region 12/31/2012  . Hyperlipidemia 07/16/2012  . Chronic low back pain 07/16/2012  . Vitamin B12 deficient megaloblastic anemia 07/16/2012   Outpatient Encounter Prescriptions as of 10/24/2014  Medication Sig  . alendronate (FOSAMAX) 70 MG tablet Take 1 tablet (70 mg total) by mouth every 7 (seven) days. Take with a full glass of water on an empty stomach.  . calcium carbonate (OS-CAL) 600 MG TABS tablet Take 600 mg by mouth daily with breakfast.  . cholecalciferol (VITAMIN D) 1000 UNITS tablet Take 1,000 Units by mouth daily.  . fluticasone (FLONASE) 50 MCG/ACT nasal spray Place 2 sprays into both nostrils daily.  . Multiple Vitamins-Minerals (CENTRUM SILVER) tablet Take 1 tablet by mouth every evening.  . nitrofurantoin  (MACRODANTIN) 100 MG capsule Take 100 mg by mouth at bedtime.  . simvastatin (ZOCOR) 80 MG tablet TAKE ONE TABLET DAILY AT BEDTIME   No facility-administered encounter medications on file as of 10/24/2014.     Review of Systems  Constitutional: Positive for fatigue.  HENT: Negative.   Eyes: Negative.   Respiratory: Negative.   Cardiovascular: Negative.   Gastrointestinal: Negative.   Endocrine: Negative.   Genitourinary: Negative.   Musculoskeletal: Positive for neck pain.  Skin: Negative.   Allergic/Immunologic: Negative.   Neurological: Negative.   Hematological: Negative.   Psychiatric/Behavioral: Negative.        Objective:   Physical Exam  Constitutional: She is oriented to person, place, and time. She appears well-developed and well-nourished.  HENT:  Head: Normocephalic and atraumatic.  Right Ear: External ear normal.  Left Ear: External ear normal.  Nose: Nose normal.  Mouth/Throat: Oropharynx is clear and moist. No oropharyngeal exudate.  Eyes: Conjunctivae and EOM are normal. Pupils are equal, round, and reactive to light. Right eye exhibits no discharge. Left eye exhibits no discharge. No scleral icterus.  Neck: Normal range of motion. Neck supple. No thyromegaly present.  She has fairly good mobility in the neck and there is no sign of any rash. Reflexes were equal bilaterally.  Cardiovascular: Normal rate, regular rhythm, normal heart sounds and intact distal pulses.  Exam reveals no friction rub.   No murmur heard. At 72/m  Pulmonary/Chest: Effort normal and breath sounds normal. No respiratory distress. She  has no wheezes. She has no rales. She exhibits no tenderness.  Clear anteriorly and posteriorly  Abdominal: Soft. Bowel sounds are normal. She exhibits no mass. There is no tenderness. There is no rebound and no guarding.  The abdomen was nontender to palpation without masses or organ enlargement or bruits  Musculoskeletal: Normal range of motion. She  exhibits no edema or tenderness.  The patient has a kyphotic posture. There is no catching with extending and flexing the ring finger on the right hand. There is no significant tenderness to palpation. Reflexes in the upper extremity were good and check good radial pulses.  Lymphadenopathy:    She has no cervical adenopathy.  Neurological: She is alert and oriented to person, place, and time. She has normal reflexes. No cranial nerve deficit.  Skin: Skin is warm and dry. No rash noted.  Psychiatric: She has a normal mood and affect. Her behavior is normal. Judgment and thought content normal.  Nursing note and vitals reviewed.   BP 138/55 mmHg  Pulse 68  Temp(Src) 96.8 F (36 C) (Oral)  Ht 4' 10.5" (1.486 m)  Wt 140 lb (63.504 kg)  BMI 28.76 kg/m2       Assessment & Plan:  1. Vitamin D deficiency -Continue current treatment pending results of lab work - CBC with Differential/Platelet - Vit D  25 hydroxy (rtn osteoporosis monitoring)  2. Hyperlipidemia -Continue simvastatin pending results of lab work - BMP8+EGFR - CBC with Differential/Platelet - Hepatic function panel - Lipid panel  3. Vitamin B12 deficient megaloblastic anemia - CBC with Differential/Platelet  4. Trigger finger of right hand -Wear Band-Aid as directed at nighttime and take anti-inflammatory medicine  5. Neck pain -Take ibuprofen twice daily after breakfast and supper for the next 7-10 days  6. Osteoporosis -Continue to be careful and not put herself at risk for falling  7. Chronic low back pain -This is been stable. The patient has a standing appointment with the neurosurgeon if pain recurs for further injections  Meds ordered this encounter  Medications  . simvastatin (ZOCOR) 80 MG tablet    Sig: Take 1 tablet (80 mg total) by mouth at bedtime.    Dispense:  30 tablet    Refill:  6   Patient Instructions                       Medicare Annual Wellness Visit  [facility] and the medical  providers at [facility] strive to bring you the best medical care.  In doing so we not only want to address your current medical conditions and concerns but also to detect new conditions early and prevent illness, disease and health-related problems.    Medicare offers a yearly Wellness Visit which allows our clinical staff to assess your need for preventative services including immunizations, lifestyle education, counseling to decrease risk of preventable diseases and screening for fall risk and other medical concerns.    This visit is provided free of charge (no copay) for all Medicare recipients. The clinical pharmacists at [facility] have begun to conduct these Wellness Visits which will also include a thorough review of all your medications.    As you primary medical provider recommend that you make an appointment for your Annual Wellness Visit if you have not done so already this year.  You may set up this appointment before you leave today or you may call back [phone]) and schedule an appointment.  Please  make sure when you call that you mention that you are scheduling your Annual Wellness Visit with the clinical pharmacist so that the appointment may be made for the proper length of time.     Continue current medications. Continue good therapeutic lifestyle changes which include good diet and exercise. Fall precautions discussed with patient. If an FOBT was given today- please return it to our front desk. If you are over 25 years old - you may need Prevnar 29 or the adult Pneumonia vaccine.  **Flu shots will be available soon--- please call and schedule a FLU-CLINIC appointment**  After your visit with [place] today you will receive a survey in the mail or online from [employer] regarding your care with [place]. Please take a moment to fill this out. Your feedback is very important to [place] as you can help [place] better understand your patient needs as well  as improve your experience and satisfaction. WE CARE ABOUT YOU!!!  The patient should take an Advil twice daily after breakfast and supper for the next 7-10 days to see if this will help relieve some of her neck pain. She should also use warm wet compresses 20 minutes 3 or 4 times daily She should continue to be careful and not put herself at risk for falling by not climbing. She should use the Band-Aid on her finger at nighttime to see if this would help the trigger finger. If the problem continues with a trigger finger she should get back in touch with [place] and we will arrange for her to have an injection.   Arrie Senate MD

## 2014-10-24 NOTE — Patient Instructions (Addendum)
Medicare Annual Wellness Visit  [facility] and the medical providers at [facility] strive to bring you the best medical care.  In doing so we not only want to address your current medical conditions and concerns but also to detect new conditions early and prevent illness, disease and health-related problems.    Medicare offers a yearly Wellness Visit which allows our clinical staff to assess your need for preventative services including immunizations, lifestyle education, counseling to decrease risk of preventable diseases and screening for fall risk and other medical concerns.    This visit is provided free of charge (no copay) for all Medicare recipients. The clinical pharmacists at [facility] have begun to conduct these Wellness Visits which will also include a thorough review of all your medications.    As you primary medical provider recommend that you make an appointment for your Annual Wellness Visit if you have not done so already this year.  You may set up this appointment before you leave today or you may call back [phone]) and schedule an appointment.  Please make sure when you call that you mention that you are scheduling your Annual Wellness Visit with the clinical pharmacist so that the appointment may be made for the proper length of time.     Continue current medications. Continue good therapeutic lifestyle changes which include good diet and exercise. Fall precautions discussed with patient. If an FOBT was given today- please return it to our front desk. If you are over 57 years old - you may need Prevnar 13 or the adult Pneumonia vaccine.  **Flu shots will be available soon--- please call and schedule a FLU-CLINIC appointment**  After your visit with [place] today you will receive a survey in the mail or online from [employer] regarding your care with [place]. Please take a moment to fill this out. Your feedback is  very important to [place] as you can help [place] better understand your patient needs as well as improve your experience and satisfaction. WE CARE ABOUT YOU!!!  The patient should take an Advil twice daily after breakfast and supper for the next 7-10 days to see if this will help relieve some of her neck pain. She should also use warm wet compresses 20 minutes 3 or 4 times daily She should continue to be careful and not put herself at risk for falling by not climbing. She should use the Band-Aid on her finger at nighttime to see if this would help the trigger finger. If the problem continues with a trigger finger she should get back in touch with [place] and we will arrange for her to have an injection.

## 2014-10-25 LAB — HEPATIC FUNCTION PANEL
ALT: 43 IU/L — AB (ref 0–32)
AST: 30 IU/L (ref 0–40)
Albumin: 4.5 g/dL (ref 3.5–4.7)
Alkaline Phosphatase: 78 IU/L (ref 39–117)
BILIRUBIN, DIRECT: 0.18 mg/dL (ref 0.00–0.40)
Bilirubin Total: 0.5 mg/dL (ref 0.0–1.2)
Total Protein: 7.4 g/dL (ref 6.0–8.5)

## 2014-10-25 LAB — BMP8+EGFR
BUN/Creatinine Ratio: 13 (ref 11–26)
BUN: 13 mg/dL (ref 8–27)
CO2: 26 mmol/L (ref 18–29)
Calcium: 10.2 mg/dL (ref 8.7–10.3)
Chloride: 97 mmol/L (ref 97–108)
Creatinine, Ser: 1.02 mg/dL — ABNORMAL HIGH (ref 0.57–1.00)
GFR, EST AFRICAN AMERICAN: 57 mL/min/{1.73_m2} — AB (ref >59)
GFR, EST NON AFRICAN AMERICAN: 50 mL/min/{1.73_m2} — AB (ref >59)
Glucose: 186 mg/dL — ABNORMAL HIGH (ref 65–99)
Potassium: 4.2 mmol/L (ref 3.5–5.2)
SODIUM: 141 mmol/L (ref 134–144)

## 2014-10-25 LAB — LIPID PANEL
Chol/HDL Ratio: 4.4 ratio units (ref 0.0–4.4)
Cholesterol, Total: 158 mg/dL (ref 100–199)
HDL: 36 mg/dL — ABNORMAL LOW (ref >39)
LDL Calculated: 94 mg/dL (ref 0–99)
Triglycerides: 139 mg/dL (ref 0–149)
VLDL Cholesterol Cal: 28 mg/dL (ref 5–40)

## 2014-10-25 LAB — CBC WITH DIFFERENTIAL/PLATELET
BASOS: 1 %
Basophils Absolute: 0 10*3/uL (ref 0.0–0.2)
EOS (ABSOLUTE): 0.1 10*3/uL (ref 0.0–0.4)
Eos: 1 %
HEMATOCRIT: 39.6 % (ref 34.0–46.6)
Hemoglobin: 13.1 g/dL (ref 11.1–15.9)
Immature Grans (Abs): 0 10*3/uL (ref 0.0–0.1)
Immature Granulocytes: 0 %
Lymphocytes Absolute: 1.1 10*3/uL (ref 0.7–3.1)
Lymphs: 18 %
MCH: 28.5 pg (ref 26.6–33.0)
MCHC: 33.1 g/dL (ref 31.5–35.7)
MCV: 86 fL (ref 79–97)
MONOS ABS: 0.8 10*3/uL (ref 0.1–0.9)
Monocytes: 13 %
NEUTROS ABS: 4.3 10*3/uL (ref 1.4–7.0)
NEUTROS PCT: 67 %
Platelets: 261 10*3/uL (ref 150–379)
RBC: 4.6 x10E6/uL (ref 3.77–5.28)
RDW: 13.5 % (ref 12.3–15.4)
WBC: 6.4 10*3/uL (ref 3.4–10.8)

## 2014-10-25 LAB — VITAMIN D 25 HYDROXY (VIT D DEFICIENCY, FRACTURES): VIT D 25 HYDROXY: 28.8 ng/mL — AB (ref 30.0–100.0)

## 2014-10-27 LAB — SPECIMEN STATUS REPORT

## 2014-10-27 LAB — HGB A1C W/O EAG: Hgb A1c MFr Bld: 5.2 % (ref 4.8–5.6)

## 2014-11-01 ENCOUNTER — Telehealth: Payer: Self-pay | Admitting: Family Medicine

## 2015-02-24 ENCOUNTER — Encounter: Payer: Self-pay | Admitting: Family Medicine

## 2015-02-24 ENCOUNTER — Ambulatory Visit (INDEPENDENT_AMBULATORY_CARE_PROVIDER_SITE_OTHER): Payer: Medicare Other | Admitting: Family Medicine

## 2015-02-24 VITALS — BP 142/67 | HR 64 | Temp 97.0°F | Ht 58.5 in | Wt 140.0 lb

## 2015-02-24 DIAGNOSIS — E559 Vitamin D deficiency, unspecified: Secondary | ICD-10-CM

## 2015-02-24 DIAGNOSIS — M545 Low back pain: Secondary | ICD-10-CM | POA: Diagnosis not present

## 2015-02-24 DIAGNOSIS — D531 Other megaloblastic anemias, not elsewhere classified: Secondary | ICD-10-CM

## 2015-02-24 DIAGNOSIS — R7309 Other abnormal glucose: Secondary | ICD-10-CM

## 2015-02-24 DIAGNOSIS — M4806 Spinal stenosis, lumbar region: Secondary | ICD-10-CM

## 2015-02-24 DIAGNOSIS — E785 Hyperlipidemia, unspecified: Secondary | ICD-10-CM | POA: Diagnosis not present

## 2015-02-24 DIAGNOSIS — M48061 Spinal stenosis, lumbar region without neurogenic claudication: Secondary | ICD-10-CM

## 2015-02-24 DIAGNOSIS — R739 Hyperglycemia, unspecified: Secondary | ICD-10-CM

## 2015-02-24 DIAGNOSIS — G8929 Other chronic pain: Secondary | ICD-10-CM

## 2015-02-24 NOTE — Patient Instructions (Addendum)
Medicare Annual Wellness Visit  [facility] and the medical providers at [facility] strive to bring you the best medical care.  In doing so we not only want to address your current medical conditions and concerns but also to detect new conditions early and prevent illness, disease and health-related problems.    Medicare offers a yearly Wellness Visit which allows our clinical staff to assess your need for preventative services including immunizations, lifestyle education, counseling to decrease risk of preventable diseases and screening for fall risk and other medical concerns.    This visit is provided free of charge (no copay) for all Medicare recipients. The clinical pharmacists at [facility] have begun to conduct these Wellness Visits which will also include a thorough review of all your medications.    As you primary medical provider recommend that you make an appointment for your Annual Wellness Visit if you have not done so already this year.  You may set up this appointment before you leave today or you may call back [phone] and schedule an appointment.  Please make sure when you call that you mention that you are scheduling your Annual Wellness Visit with the clinical pharmacist so that the appointment may be made for the proper length of time.     Continue current medications. Continue good therapeutic lifestyle changes which include good diet and exercise. Fall precautions discussed with patient. If an FOBT was given today- please return it to our front desk. If you are over 46 years old - you may need Prevnar 69 or the adult Pneumonia vaccine.  **Flu shots are available--- please call and schedule a FLU-CLINIC appointment**  After your visit with [place] today you will receive a survey in the mail or online from [employer] regarding your care with [place]. Please take a moment to fill this out. Your feedback is very  important to [place] as you can help [place] better understand your patient needs as well as improve your experience and satisfaction. WE CARE ABOUT YOU!!!   The patient should avoid all climbing issue work out accommodations to get the things she is climbing into the attic for a lower level so she does not have to climb into the attic She is to continue to make all efforts to not put yourself at risk for falling Use a cool mist humidifier in the home Use nasal saline spray and gel frequently Drink plenty of fluids and keep the house as cool as possible

## 2015-02-24 NOTE — Progress Notes (Signed)
Subjective:    Patient ID: [name], female    DOB: February 11, 1925, 82 y.o.   MRN: [id number]  HPI Pt here for follow up and management of chronic medical problems which includes hyperlipidemia. She is taking medications regularly. Patient is stable and doing well. She is complaining with some nasal dryness and congestion and complains of her eyes draining a lot. She is due to return an FOBT and get lab work today. The daughter is concerned about her blood sugar being elevated and some elevated liver function tests and is concerned about antibiotics that she's been taking for bladder could be causing this. We will call her daughter and reassured her about question she has about the medication. The patient should take a copy of her blood work when she goes back to see the urologist at the next visit. She denies any chest pain or shortness of breath anymore than usual. No trouble with swallowing heartburn indigestion and nausea vomiting diarrhea or blood in the stool. She is doing well with her back currently and has not had any falls increased pain.     Patient Active Problem List   Diagnosis Date Noted  . Chronic cystitis 01/08/2013  . Vitamin D deficiency 12/31/2012  . Osteoporosis 12/31/2012  . Spinal stenosis of lumbar region 12/31/2012  . Hyperlipidemia 07/16/2012  . Chronic low back pain 07/16/2012  . Vitamin B12 deficient megaloblastic anemia 07/16/2012   Outpatient Encounter Prescriptions as of 02/24/2015  Medication Sig  . alendronate (FOSAMAX) 70 MG tablet Take 1 tablet (70 mg total) by mouth every 7 (seven) days. Take with a full glass of water on an empty stomach.  . calcium carbonate (OS-CAL) 600 MG TABS tablet Take 600 mg by mouth daily with breakfast.  . cholecalciferol (VITAMIN D) 1000 UNITS tablet Take 1,000 Units by mouth daily.  . fluticasone (FLONASE) 50 MCG/ACT nasal spray Place 2 sprays into both nostrils daily.  . Multiple Vitamins-Minerals (CENTRUM SILVER) tablet  Take 1 tablet by mouth every evening.  . nitrofurantoin (MACRODANTIN) 100 MG capsule Take 100 mg by mouth at bedtime.  . simvastatin (ZOCOR) 80 MG tablet Take 1 tablet (80 mg total) by mouth at bedtime.   No facility-administered encounter medications on file as of 02/24/2015.      Review of Systems  Constitutional: Negative.   HENT: Positive for postnasal drip.   Eyes: Negative.   Respiratory: Negative.   Cardiovascular: Negative.   Gastrointestinal: Negative.   Endocrine: Negative.   Genitourinary: Negative.   Musculoskeletal: Negative.   Skin: Negative.   Allergic/Immunologic: Negative.   Neurological: Negative.   Hematological: Negative.   Psychiatric/Behavioral: Negative.        Objective:   Physical Exam  Constitutional: She is oriented to person, place, and time. No distress.  Tiny, frail but alert  HENT:  Head: Normocephalic and atraumatic.  Right Ear: External ear normal.  Left Ear: External ear normal.  Nose: Nose normal.  Mouth/Throat: Oropharynx is clear and moist. No oropharyngeal exudate.  Eyes: Conjunctivae and EOM are normal. Pupils are equal, round, and reactive to light. Right eye exhibits no discharge. Left eye exhibits no discharge. No scleral icterus.  Neck: Normal range of motion. Neck supple. No thyromegaly present.  Cardiovascular: Normal rate, regular rhythm and normal heart sounds.   No murmur heard. Pulmonary/Chest: Effort normal and breath sounds normal. No respiratory distress. She has no wheezes. She has no rales. She exhibits no tenderness.  Clear anteriorly and posteriorly  Abdominal: Soft.  Bowel sounds are normal. She exhibits no mass. There is no tenderness. There is no rebound and no guarding.  No abdominal tenderness or masses palpated  Musculoskeletal: She exhibits no edema or tenderness.  Kyphotic posture is somewhat unstable gait due to her posture  Lymphadenopathy:    She has no cervical adenopathy.  Neurological: She is alert and  oriented to person, place, and time.  Skin: Skin is warm and dry. No rash noted.  Psychiatric: She has a normal mood and affect. Her behavior is normal. Judgment and thought content normal.  Nursing note and vitals reviewed.  BP 142/67 mmHg  Pulse 64  Temp(Src) 97 F (36.1 C) (Oral)  Ht 4' 10.5" (1.486 m)  Wt 140 lb (63.504 kg)  BMI 28.76 kg/m2        Assessment & Plan:  1. Vitamin D deficiency -Continue current treatment pending results of lab work - CBC with Differential/Platelet - VITAMIN D 25 Hydroxy (Vit-D Deficiency, Fractures)  2. Hyperlipidemia -Continue current treatment pending results of lab work - Lipid panel - BMP8+EGFR - CBC with Differential/Platelet - Hepatic function panel  3. Vitamin B12 deficient megaloblastic anemia -Continue current vitamin D treatment - CBC with Differential/Platelet  4. Spinal stenosis of lumbar region -She continues to do well with her back with minimal pain and no history of any falls  5. Chronic low back pain -This is stable currently  6. Blood sugar increased -The patient had an elevated blood sugar at the last visit but her hemoglobin A1c was 5.2%. She says she came to the office fasting. We will check A1c and blood sugar again today on the blood work to make sure that everything is stable with this.  Patient Instructions                       Medicare Annual Wellness Visit  [facility] and the medical providers at [facility] strive to bring you the best medical care.  In doing so we not only want to address your current medical conditions and concerns but also to detect new conditions early and prevent illness, disease and health-related problems.    Medicare offers a yearly Wellness Visit which allows our clinical staff to assess your need for preventative services including immunizations, lifestyle education, counseling to decrease risk of preventable diseases and screening for fall risk and other  medical concerns.    This visit is provided free of charge (no copay) for all Medicare recipients. The clinical pharmacists at [facility] have begun to conduct these Wellness Visits which will also include a thorough review of all your medications.    As you primary medical provider recommend that you make an appointment for your Annual Wellness Visit if you have not done so already this year.  You may set up this appointment before you leave today or you may call back [phone]) and schedule an appointment.  Please make sure when you call that you mention that you are scheduling your Annual Wellness Visit with the clinical pharmacist so that the appointment may be made for the proper length of time.     Continue current medications. Continue good therapeutic lifestyle changes which include good diet and exercise. Fall precautions discussed with patient. If an FOBT was given today- please return it to our front desk. If you are over 39 years old - you may need Prevnar 39 or the adult Pneumonia vaccine.  **Flu shots are available--- please  call and schedule a FLU-CLINIC appointment**  After your visit with [place] today you will receive a survey in the mail or online from [employer] regarding your care with [place]. Please take a moment to fill this out. Your feedback is very important to [place] as you can help [place] better understand your patient needs as well as improve your experience and satisfaction. WE CARE ABOUT YOU!!!   The patient should avoid all climbing issue work out accommodations to get the things she is climbing into the attic for a lower level so she does not have to climb into the attic She is to continue to make all efforts to not put yourself at risk for falling Use a cool mist humidifier in the home Use nasal saline spray and gel frequently Drink plenty of fluids and keep the house as cool as possible   Arrie Senate MD

## 2015-02-25 LAB — CBC WITH DIFFERENTIAL/PLATELET
BASOS ABS: 0 10*3/uL (ref 0.0–0.2)
Basos: 1 %
EOS (ABSOLUTE): 0.2 10*3/uL (ref 0.0–0.4)
Eos: 4 %
Hematocrit: 40.9 % (ref 34.0–46.6)
Hemoglobin: 13.7 g/dL (ref 11.1–15.9)
IMMATURE GRANS (ABS): 0 10*3/uL (ref 0.0–0.1)
IMMATURE GRANULOCYTES: 0 %
LYMPHS: 17 %
Lymphocytes Absolute: 1.1 10*3/uL (ref 0.7–3.1)
MCH: 28.5 pg (ref 26.6–33.0)
MCHC: 33.5 g/dL (ref 31.5–35.7)
MCV: 85 fL (ref 79–97)
MONOCYTES: 11 %
Monocytes Absolute: 0.7 10*3/uL (ref 0.1–0.9)
Neutrophils Absolute: 4.2 10*3/uL (ref 1.4–7.0)
Neutrophils: 67 %
Platelets: 249 10*3/uL (ref 150–379)
RBC: 4.81 x10E6/uL (ref 3.77–5.28)
RDW: 13.9 % (ref 12.3–15.4)
WBC: 6.2 10*3/uL (ref 3.4–10.8)

## 2015-02-25 LAB — HEPATIC FUNCTION PANEL
ALBUMIN: 4.4 g/dL (ref 3.5–4.7)
ALK PHOS: 77 IU/L (ref 39–117)
ALT: 11 IU/L (ref 0–32)
AST: 26 IU/L (ref 0–40)
BILIRUBIN, DIRECT: 0.13 mg/dL (ref 0.00–0.40)
Bilirubin Total: 0.3 mg/dL (ref 0.0–1.2)
TOTAL PROTEIN: 7.2 g/dL (ref 6.0–8.5)

## 2015-02-25 LAB — VITAMIN D 25 HYDROXY (VIT D DEFICIENCY, FRACTURES): Vit D, 25-Hydroxy: 38.8 ng/mL (ref 30.0–100.0)

## 2015-02-25 LAB — BMP8+EGFR
BUN/Creatinine Ratio: 17 (ref 11–26)
BUN: 10 mg/dL (ref 8–27)
CALCIUM: 9.8 mg/dL (ref 8.7–10.3)
CHLORIDE: 100 mmol/L (ref 96–106)
CO2: 29 mmol/L (ref 18–29)
Creatinine, Ser: 0.59 mg/dL (ref 0.57–1.00)
GFR calc Af Amer: 95 mL/min/{1.73_m2} (ref >59)
GFR calc non Af Amer: 83 mL/min/{1.73_m2} (ref >59)
GLUCOSE: 84 mg/dL (ref 65–99)
Potassium: 4.8 mmol/L (ref 3.5–5.2)
Sodium: 143 mmol/L (ref 134–144)

## 2015-02-25 LAB — LIPID PANEL
CHOL/HDL RATIO: 2.7 ratio (ref 0.0–4.4)
Cholesterol, Total: 175 mg/dL (ref 100–199)
HDL: 64 mg/dL (ref >39)
LDL Calculated: 84 mg/dL (ref 0–99)
TRIGLYCERIDES: 136 mg/dL (ref 0–149)
VLDL Cholesterol Cal: 27 mg/dL (ref 5–40)

## 2015-03-01 DIAGNOSIS — H3540 Unspecified peripheral retinal degeneration: Secondary | ICD-10-CM | POA: Diagnosis not present

## 2015-03-01 DIAGNOSIS — Z961 Presence of intraocular lens: Secondary | ICD-10-CM | POA: Diagnosis not present

## 2015-03-01 DIAGNOSIS — H35413 Lattice degeneration of retina, bilateral: Secondary | ICD-10-CM | POA: Diagnosis not present

## 2015-03-01 DIAGNOSIS — H04123 Dry eye syndrome of bilateral lacrimal glands: Secondary | ICD-10-CM | POA: Diagnosis not present

## 2015-03-07 ENCOUNTER — Other Ambulatory Visit: Payer: Medicare Other

## 2015-03-07 DIAGNOSIS — Z1212 Encounter for screening for malignant neoplasm of rectum: Secondary | ICD-10-CM

## 2015-03-09 LAB — FECAL OCCULT BLOOD, IMMUNOCHEMICAL: FECAL OCCULT BLD: NEGATIVE

## 2015-03-10 ENCOUNTER — Ambulatory Visit (INDEPENDENT_AMBULATORY_CARE_PROVIDER_SITE_OTHER): Payer: Medicare Other | Admitting: Urology

## 2015-03-10 DIAGNOSIS — N3020 Other chronic cystitis without hematuria: Secondary | ICD-10-CM | POA: Diagnosis not present

## 2015-03-10 DIAGNOSIS — N3946 Mixed incontinence: Secondary | ICD-10-CM

## 2015-03-21 ENCOUNTER — Ambulatory Visit (INDEPENDENT_AMBULATORY_CARE_PROVIDER_SITE_OTHER): Payer: Medicare Other | Admitting: Family Medicine

## 2015-03-21 VITALS — BP 167/58 | HR 65 | Temp 97.2°F | Ht 58.5 in | Wt 141.2 lb

## 2015-03-21 DIAGNOSIS — J40 Bronchitis, not specified as acute or chronic: Secondary | ICD-10-CM

## 2015-03-21 DIAGNOSIS — J329 Chronic sinusitis, unspecified: Secondary | ICD-10-CM | POA: Diagnosis not present

## 2015-03-21 MED ORDER — MINOCYCLINE HCL 100 MG PO CAPS: 100.0000 mg | ORAL_CAPSULE | Freq: Two times a day (BID) | ORAL | Status: DC

## 2015-03-21 MED ORDER — BETAMETHASONE SOD PHOS & ACET 6 (3-3) MG/ML IJ SUSP
6.0000 mg | Freq: Once | INTRAMUSCULAR | Status: AC
  Administered 2015-03-21: 6 mg via INTRAMUSCULAR

## 2015-03-21 NOTE — Progress Notes (Signed)
Subjective:  Patient ID: [name], female    DOB: [birth year]  Age: 82 y.o. MRN: [id number]  CC: Cough; Sinusitis; and Laryngitis   HPI EMILYJO CLAYSON presents for Patient presents with upper respiratory congestion. Rhinorrhea that is frequently purulent. There is moderate sore throat. Patient reports coughing frequently as well.-colored/purulent sputum noted. There is no fever no chills no sweats. The patient denies being short of breath. Onset was 3-5 days ago. Gradually worsening in spite of home remedies.    History Terriona has a past medical history of Backache, unspecified; Osteoarthrosis and allied disorders; Peripheral neuropathy ([place]); Other and unspecified hyperlipidemia; Lumbar stenosis; Arthritis; Osteoporosis; Vitamin D deficiency; Cataract; and Glaucoma.   She has past surgical history that includes Lumbar laminectomy; Knee arthroscopy; Cataract extraction; Eye surgery (Bilateral); and Abdominal hysterectomy.   Her family history includes Asthma in her daughter and son; COPD in her son; Cancer in her mother, sister, and sister; Diabetes in her brother.She reports that she has never smoked. She has never used smokeless tobacco. She reports that she does not drink alcohol or use illicit drugs.    ROS Review of Systems  Constitutional: Negative for fever, chills, activity change and appetite change.  HENT: Positive for congestion, postnasal drip, rhinorrhea and sinus pressure. Negative for ear discharge, ear pain, hearing loss, nosebleeds, sneezing and trouble swallowing.   Respiratory: Negative for chest tightness and shortness of breath.   Cardiovascular: Negative for chest pain and palpitations.  Skin: Negative for rash.    Objective:  BP 167/58 mmHg  Pulse 65  Temp(Src) 97.2 F (36.2 C) (Oral)  Ht 4' 10.5" (1.486 m)  Wt 141 lb 3.2 oz (64.048 kg)  BMI 29.00 kg/m2  BP Readings from Last 3 Encounters:  03/21/15 167/58  02/24/15 142/67  10/24/14 138/55     Wt Readings from Last 3 Encounters:  03/21/15 141 lb 3.2 oz (64.048 kg)  02/24/15 140 lb (63.504 kg)  10/24/14 140 lb (63.504 kg)     Physical Exam  Constitutional: She appears well-developed and well-nourished.  HENT:  Head: Normocephalic and atraumatic.  Right Ear: Tympanic membrane and external ear normal. No decreased hearing is noted.  Left Ear: Tympanic membrane and external ear normal. No decreased hearing is noted.  Nose: Mucosal edema present. Right sinus exhibits no frontal sinus tenderness. Left sinus exhibits no frontal sinus tenderness.  Mouth/Throat: No oropharyngeal exudate or posterior oropharyngeal erythema.  Neck: No Brudzinski's sign noted.  Pulmonary/Chest: Breath sounds normal. No respiratory distress.  Lymphadenopathy:       Head (right side): No preauricular adenopathy present.       Head (left side): No preauricular adenopathy present.       Right cervical: No superficial cervical adenopathy present.      Left cervical: No superficial cervical adenopathy present.     Lab Results  Component Value Date   WBC 6.2 02/24/2015   HGB 12.9 06/09/2014   HCT 40.9 02/24/2015   PLT 249 02/24/2015   GLUCOSE 84 02/24/2015   CHOL 175 02/24/2015   TRIG 136 02/24/2015   HDL 64 02/24/2015   LDLCALC 84 02/24/2015   ALT 11 02/24/2015   AST 26 02/24/2015   NA 143 02/24/2015   K 4.8 02/24/2015   CL 100 02/24/2015   CREATININE 0.59 02/24/2015   BUN 10 02/24/2015   CO2 29 02/24/2015   TSH 0.358 07/16/2012   INR 1.3 05/19/2008   HGBA1C 5.2 10/24/2014    Dg Abd 1 View  07/28/2013  CLINICAL DATA:  Recurrent UTIs EXAM: ABDOMEN - 1 VIEW COMPARISON:  None. FINDINGS: The bowel gas pattern is normal. No radio-opaque calculi or other significant radiographic abnormality are seen. Degenerative changes of the lumbar spine are noted. IMPRESSION: No acute abnormality seen. Electronically Signed   By: Inez Catalina M.D.   On: 07/28/2013 13:25   US Renal  07/28/2013   CLINICAL DATA:  Urinary tract infection. EXAM: RENAL/URINARY TRACT ULTRASOUND COMPLETE COMPARISON:  None. FINDINGS: Right Kidney: Length: 9.9 cm. Echogenicity within normal limits. No mass or hydronephrosis visualized. Left Kidney: Length: 10.1 cm. Echogenicity within normal limits. No mass or hydronephrosis visualized. Bladder: Appears normal for degree of bladder distention. Bilateral ureteral jets are noted. Prevoid volume is calculated at 271 cubic cm. Bladder is completely decompressed post Foley. IMPRESSION: Normal renal ultrasound. Electronically Signed   By: Sabino Dick M.D.   On: 07/28/2013 14:37    Assessment & Plan:   Nickol was seen today for cough, sinusitis and laryngitis.  Diagnoses and all orders for this visit:  Sinobronchitis -     betamethasone acetate-betamethasone sodium phosphate (CELESTONE) injection 6 mg; Inject 1 mL (6 mg total) into the muscle once.  Other orders -     minocycline (MINOCIN) 100 MG capsule; Take 1 capsule (100 mg total) by mouth 2 (two) times daily. Take on an empty stomach      I have discontinued Ms. Seeber's nitrofurantoin. I am also having her start on minocycline. Additionally, I am having her maintain her CENTRUM SILVER, calcium carbonate, fluticasone, cholecalciferol, alendronate, and simvastatin. We will continue to administer betamethasone acetate-betamethasone sodium phosphate.  Meds ordered this encounter  Medications  . betamethasone acetate-betamethasone sodium phosphate (CELESTONE) injection 6 mg    Sig:   . minocycline (MINOCIN) 100 MG capsule    Sig: Take 1 capsule (100 mg total) by mouth 2 (two) times daily. Take on an empty stomach    Dispense:  20 capsule    Refill:  0     Follow-up: Return if symptoms worsen or fail to improve.  Claretta Fraise, M.D.

## 2015-03-21 NOTE — Addendum Note (Signed)
Addended by: Claretta Fraise on: 03/21/2015 06:55 PM   Modules accepted: Orders

## 2015-06-19 ENCOUNTER — Other Ambulatory Visit: Payer: Self-pay | Admitting: Family Medicine

## 2015-06-29 ENCOUNTER — Other Ambulatory Visit: Payer: Self-pay | Admitting: *Deleted

## 2015-06-29 ENCOUNTER — Ambulatory Visit (INDEPENDENT_AMBULATORY_CARE_PROVIDER_SITE_OTHER): Payer: Medicare Other | Admitting: Family Medicine

## 2015-06-29 ENCOUNTER — Encounter: Payer: Self-pay | Admitting: Family Medicine

## 2015-06-29 ENCOUNTER — Ambulatory Visit (INDEPENDENT_AMBULATORY_CARE_PROVIDER_SITE_OTHER): Payer: Medicare Other

## 2015-06-29 VITALS — BP 136/68 | HR 70 | Temp 97.1°F | Ht 58.5 in | Wt 134.0 lb

## 2015-06-29 DIAGNOSIS — E559 Vitamin D deficiency, unspecified: Secondary | ICD-10-CM

## 2015-06-29 DIAGNOSIS — R918 Other nonspecific abnormal finding of lung field: Secondary | ICD-10-CM

## 2015-06-29 DIAGNOSIS — R9389 Abnormal findings on diagnostic imaging of other specified body structures: Secondary | ICD-10-CM

## 2015-06-29 DIAGNOSIS — G8929 Other chronic pain: Secondary | ICD-10-CM | POA: Diagnosis not present

## 2015-06-29 DIAGNOSIS — M545 Low back pain: Secondary | ICD-10-CM | POA: Diagnosis not present

## 2015-06-29 DIAGNOSIS — R062 Wheezing: Secondary | ICD-10-CM

## 2015-06-29 DIAGNOSIS — E785 Hyperlipidemia, unspecified: Secondary | ICD-10-CM | POA: Diagnosis not present

## 2015-06-29 DIAGNOSIS — M4806 Spinal stenosis, lumbar region: Secondary | ICD-10-CM | POA: Diagnosis not present

## 2015-06-29 DIAGNOSIS — D531 Other megaloblastic anemias, not elsewhere classified: Secondary | ICD-10-CM

## 2015-06-29 DIAGNOSIS — M79644 Pain in right finger(s): Secondary | ICD-10-CM

## 2015-06-29 DIAGNOSIS — M48061 Spinal stenosis, lumbar region without neurogenic claudication: Secondary | ICD-10-CM

## 2015-06-29 NOTE — Patient Instructions (Addendum)
Medicare Annual Wellness Visit  [facility] and the medical providers at [facility] strive to bring you the best medical care.  In doing so we not only want to address your current medical conditions and concerns but also to detect new conditions early and prevent illness, disease and health-related problems.    Medicare offers a yearly Wellness Visit which allows our clinical staff to assess your need for preventative services including immunizations, lifestyle education, counseling to decrease risk of preventable diseases and screening for fall risk and other medical concerns.    This visit is provided free of charge (no copay) for all Medicare recipients. The clinical pharmacists at [facility] have begun to conduct these Wellness Visits which will also include a thorough review of all your medications.    As you primary medical provider recommend that you make an appointment for your Annual Wellness Visit if you have not done so already this year.  You may set up this appointment before you leave today or you may call back [phone] and schedule an appointment.  Please make sure when you call that you mention that you are scheduling your Annual Wellness Visit with the clinical pharmacist so that the appointment may be made for the proper length of time.     Continue current medications. Continue good therapeutic lifestyle changes which include good diet and exercise. Fall precautions discussed with patient. If an FOBT was given today- please return it to our front desk. If you are over 80 years old - you may need Prevnar 12 or the adult Pneumonia vaccine.   After your visit with [place] today you will receive a survey in the mail or online from [employer] regarding your care with [place]. Please take a moment to fill this out. Your feedback is very important to [place] as you can help [place] better understand your patient needs as well as  improve your experience and satisfaction. WE CARE ABOUT YOU!!!   Patient should use the breo inhaler as directed, 1 puff once daily and rinse mouth after using She should call us in 3-4 weeks and tell us how the wheezing is doing are sooner if it gets worse She should drink plenty of fluids and stay well hydrated She should be careful about climbing and especially climbing upstairs and bringing canned goods back down the stairs She should get one of her children to help her with this We will arrange for her to see the orthopedic surgeon for an injection in her hand for the trigger finger.

## 2015-06-29 NOTE — Progress Notes (Signed)
Subjective:    Patient ID: [name], female    DOB: [birth year], 82 y.o.   MRN: [id number]  HPI Pt here for follow up and management of chronic medical problems which includes hyperlipidemia. She is taking medications regularly.The patient is complaining with fatigue in general. She also is complaining with some discomfort in the right middle finger. She has had lab work done already and we will call her with the results as soon as that is returned. The patient has a trigger finger. This is been going on for a good while and she uses her hands a lot. We will arrange for her to see an orthopedic surgeon for an injection of this. She denies any chest pain or shortness of breath. She denies any problems with her GI tract including nausea vomiting diarrhea blood in the stool or black tarry bowel movements. She does not have any heartburn or indigestion. She is passing her water without problems. 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 Her family is supportive of her.     Patient Active Problem List   Diagnosis Date Noted  . Chronic cystitis 01/08/2013  . Vitamin D deficiency 12/31/2012  . Osteoporosis 12/31/2012  . Spinal stenosis of lumbar region 12/31/2012  . Hyperlipidemia 07/16/2012  . Chronic low back pain 07/16/2012  . Vitamin B12 deficient megaloblastic anemia 07/16/2012   Outpatient Encounter Prescriptions as of 06/29/2015  Medication Sig  . alendronate (FOSAMAX) 70 MG tablet TAKE 1 TABLET WEEKLY (TAKE WITH 8OZ OF WATER 30 MINUTES BEFORE BREAKFAST)  . calcium carbonate (OS-CAL) 600 MG TABS tablet Take 600 mg by mouth daily with breakfast.  . cholecalciferol (VITAMIN D) 1000 UNITS tablet Take 1,000 Units by mouth daily.  . fluticasone (FLONASE) 50 MCG/ACT nasal spray Place 2 sprays into both nostrils daily.  . minocycline (MINOCIN) 100 MG capsule Take 1 capsule (100 mg total) by mouth 2 (two) times daily. Take  on an empty stomach  . Multiple Vitamins-Minerals (CENTRUM SILVER) tablet Take 1 tablet by mouth every evening.  . simvastatin (ZOCOR) 80 MG tablet Take 1 tablet (80 mg total) by mouth at bedtime.   No facility-administered encounter medications on file as of 06/29/2015.      Review of Systems  Constitutional: Positive for fatigue.  HENT: Negative.   Eyes: Negative.   Respiratory: Negative.   Cardiovascular: Negative.   Gastrointestinal: Negative.   Endocrine: Negative.   Genitourinary: Negative.   Musculoskeletal: Positive for arthralgias (right middle finger pain).  Skin: Negative.   Allergic/Immunologic: Negative.   Neurological: Negative.   Hematological: Negative.   Psychiatric/Behavioral: Negative.        Objective:   Physical Exam  Constitutional: She is oriented to person, place, and time. She appears well-nourished. No distress.  Elderly and well-nourished and alert  HENT:  Head: Normocephalic and atraumatic.  Right Ear: External ear normal.  Left Ear: External ear normal.  Nose: Nose normal.  Mouth/Throat: Oropharynx is clear and moist.  Eyes: Conjunctivae and EOM are normal. Pupils are equal, round, and reactive to light. Right eye exhibits no discharge. Left eye exhibits no discharge. No scleral icterus.  Neck: Normal range of motion. Neck supple. No thyromegaly present.  Cardiovascular: Normal rate, regular rhythm, normal heart sounds and intact distal pulses.   No murmur heard. The heart has a regular rate and rhythm at 72/m  Pulmonary/Chest: Effort normal. No respiratory distress. She has wheezes. She  has no rales. She exhibits no tenderness.  There are scattered wheezes with deep inspiration anteriorly. There are no rales.  Abdominal: Soft. Bowel sounds are normal. She exhibits no mass. There is no tenderness. There is no rebound and no guarding.  Nontender without masses or bruits organ enlargement  Musculoskeletal: Normal range of motion. She exhibits no  edema or tenderness.  She has tenderness in the palmar aspect of the right middle finger. Leg raising and hip abduction were good bilaterally without producing any low back pain The patient has a kyphotic posture.  Lymphadenopathy:    She has no cervical adenopathy.  Neurological: She is alert and oriented to person, place, and time. She has normal reflexes. No cranial nerve deficit.  Skin: Skin is warm and dry. No rash noted.  Psychiatric: She has a normal mood and affect. Her behavior is normal. Judgment and thought content normal.  Nursing note and vitals reviewed.   BP 136/68 mmHg  Pulse 70  Temp(Src) 97.1 F (36.2 C) (Oral)  Ht 4' 10.5" (1.486 m)  Wt 134 lb (60.782 kg)  BMI 27.53 kg/m2  [facility] reading (PRIMARY) by  Dr. Tracie Harrier x-ray with results pending                                       Assessment & Plan:  1. Vitamin D deficiency -Continue current treatment pending results of lab work - CBC with Differential/Platelet - VITAMIN D 25 Hydroxy (Vit-D Deficiency, Fractures)  2. Hyperlipidemia -Continue aggressive therapeutic lifestyle changes and current treatment - CBC with Differential/Platelet - BMP8+EGFR - Hepatic function panel - Lipid panel  3. Vitamin B12 deficient megaloblastic anemia -We will make sure we get a B12 level on her today. - CBC with Differential/Platelet  4. Spinal stenosis of lumbar region -Patient is stable with her back and has an understanding with the neurosurgeon to call him if she has any recurrence of severe back pain  5. Chronic low back pain -Patient's chronic low back pain secondary to osteoarthritis and spinal stenosis.  6. Pain of right middle finger -Referral to hand surgeon for an injection - Ambulatory referral to Hand Surgery  7. Wheezing -Use Brio inhaler as directed 1 puff daily and rinse mouth after using. 2 sample packs were given for patient to use and she is to call back if her breathing gets worse or her wheezing  gets worse. - DG Chest 2 View; Future  Patient Instructions                       Medicare Annual Wellness Visit  [facility] and the medical providers at [facility] strive to bring you the best medical care.  In doing so we not only want to address your current medical conditions and concerns but also to detect new conditions early and prevent illness, disease and health-related problems.    Medicare offers a yearly Wellness Visit which allows our clinical staff to assess your need for preventative services including immunizations, lifestyle education, counseling to decrease risk of preventable diseases and screening for fall risk and other medical concerns.    This visit is provided free of charge (no copay) for all Medicare recipients. The clinical pharmacists at [facility] have begun to conduct these Wellness Visits which will also include a thorough review of all your medications.    As you  primary medical provider recommend that you make an appointment for your Annual Wellness Visit if you have not done so already this year.  You may set up this appointment before you leave today or you may call back [phone]) and schedule an appointment.  Please make sure when you call that you mention that you are scheduling your Annual Wellness Visit with the clinical pharmacist so that the appointment may be made for the proper length of time.     Continue current medications. Continue good therapeutic lifestyle changes which include good diet and exercise. Fall precautions discussed with patient. If an FOBT was given today- please return it to our front desk. If you are over 83 years old - you may need Prevnar 47 or the adult Pneumonia vaccine.   After your visit with [place] today you will receive a survey in the mail or online from [employer] regarding your care with [place]. Please take a moment to fill this out. Your feedback is very important to [place] as you  can help [place] better understand your patient needs as well as improve your experience and satisfaction. WE CARE ABOUT YOU!!!   Patient should use the breo inhaler as directed, 1 puff once daily and rinse mouth after using She should call us in 3-4 weeks and tell us how the wheezing is doing are sooner if it gets worse She should drink plenty of fluids and stay well hydrated She should be careful about climbing and especially climbing upstairs and bringing canned goods back down the stairs She should get one of her children to help her with this We will arrange for her to see the orthopedic surgeon for an injection in her hand for the trigger finger.   Arrie Senate MD

## 2015-06-29 NOTE — Addendum Note (Signed)
Addended by: Zannie Cove on: 06/29/2015 11:24 AM   Modules accepted: Orders, SmartSet

## 2015-06-30 ENCOUNTER — Ambulatory Visit (HOSPITAL_COMMUNITY)
Admission: RE | Admit: 2015-06-30 | Discharge: 2015-06-30 | Disposition: A | Discharge status: Home / Self Care | Payer: Medicare Other | Source: Ambulatory Visit | Attending: Family Medicine | Admitting: Family Medicine

## 2015-06-30 DIAGNOSIS — J849 Interstitial pulmonary disease, unspecified: Secondary | ICD-10-CM | POA: Diagnosis not present

## 2015-06-30 DIAGNOSIS — R9389 Abnormal findings on diagnostic imaging of other specified body structures: Secondary | ICD-10-CM

## 2015-06-30 DIAGNOSIS — R938 Abnormal findings on diagnostic imaging of other specified body structures: Secondary | ICD-10-CM | POA: Insufficient documentation

## 2015-06-30 DIAGNOSIS — R918 Other nonspecific abnormal finding of lung field: Secondary | ICD-10-CM | POA: Diagnosis not present

## 2015-06-30 DIAGNOSIS — R590 Localized enlarged lymph nodes: Secondary | ICD-10-CM | POA: Diagnosis not present

## 2015-06-30 LAB — BMP8+EGFR
BUN/Creatinine Ratio: 12 (ref 12–28)
BUN: 9 mg/dL (ref 8–27)
CALCIUM: 9.4 mg/dL (ref 8.7–10.3)
CHLORIDE: 100 mmol/L (ref 96–106)
CO2: 25 mmol/L (ref 18–29)
Creatinine, Ser: 0.74 mg/dL (ref 0.57–1.00)
GFR calc non Af Amer: 73 mL/min/{1.73_m2} (ref >59)
GFR, EST AFRICAN AMERICAN: 84 mL/min/{1.73_m2} (ref >59)
Glucose: 89 mg/dL (ref 65–99)
POTASSIUM: 4.6 mmol/L (ref 3.5–5.2)
Sodium: 142 mmol/L (ref 134–144)

## 2015-06-30 LAB — HEPATIC FUNCTION PANEL
ALBUMIN: 4.2 g/dL (ref 3.5–4.7)
ALT: 10 IU/L (ref 0–32)
AST: 26 IU/L (ref 0–40)
Alkaline Phosphatase: 68 IU/L (ref 39–117)
BILIRUBIN TOTAL: 0.4 mg/dL (ref 0.0–1.2)
BILIRUBIN, DIRECT: 0.13 mg/dL (ref 0.00–0.40)
Total Protein: 6.4 g/dL (ref 6.0–8.5)

## 2015-06-30 LAB — CBC WITH DIFFERENTIAL/PLATELET
BASOS: 1 %
Basophils Absolute: 0 10*3/uL (ref 0.0–0.2)
EOS (ABSOLUTE): 0.2 10*3/uL (ref 0.0–0.4)
EOS: 3 %
HEMATOCRIT: 40.5 % (ref 34.0–46.6)
Hemoglobin: 13.4 g/dL (ref 11.1–15.9)
IMMATURE GRANS (ABS): 0 10*3/uL (ref 0.0–0.1)
IMMATURE GRANULOCYTES: 0 %
LYMPHS: 18 %
Lymphocytes Absolute: 1 10*3/uL (ref 0.7–3.1)
MCH: 28.5 pg (ref 26.6–33.0)
MCHC: 33.1 g/dL (ref 31.5–35.7)
MCV: 86 fL (ref 79–97)
MONOCYTES: 12 %
MONOS ABS: 0.6 10*3/uL (ref 0.1–0.9)
NEUTROS PCT: 66 %
Neutrophils Absolute: 3.7 10*3/uL (ref 1.4–7.0)
Platelets: 247 10*3/uL (ref 150–379)
RBC: 4.71 x10E6/uL (ref 3.77–5.28)
RDW: 13.6 % (ref 12.3–15.4)
WBC: 5.6 10*3/uL (ref 3.4–10.8)

## 2015-06-30 LAB — VITAMIN B12: Vitamin B-12: 336 pg/mL (ref 211–946)

## 2015-06-30 LAB — LIPID PANEL
CHOL/HDL RATIO: 2.6 ratio (ref 0.0–4.4)
Cholesterol, Total: 153 mg/dL (ref 100–199)
HDL: 58 mg/dL (ref >39)
LDL Calculated: 72 mg/dL (ref 0–99)
Triglycerides: 115 mg/dL (ref 0–149)
VLDL CHOLESTEROL CAL: 23 mg/dL (ref 5–40)

## 2015-06-30 LAB — VITAMIN D 25 HYDROXY (VIT D DEFICIENCY, FRACTURES): VIT D 25 HYDROXY: 42.4 ng/mL (ref 30.0–100.0)

## 2015-07-01 ENCOUNTER — Telehealth: Payer: Self-pay | Admitting: Family Medicine

## 2015-07-01 NOTE — Telephone Encounter (Signed)
Lm - [facility] has not addressed CT yet - we will call when we have more info

## 2015-07-03 ENCOUNTER — Telehealth: Payer: Self-pay | Admitting: Family Medicine

## 2015-07-03 DIAGNOSIS — R911 Solitary pulmonary nodule: Secondary | ICD-10-CM

## 2015-07-03 DIAGNOSIS — J984 Other disorders of lung: Secondary | ICD-10-CM

## 2015-07-07 ENCOUNTER — Encounter: Payer: Self-pay | Admitting: Internal Medicine

## 2015-07-07 ENCOUNTER — Other Ambulatory Visit (INDEPENDENT_AMBULATORY_CARE_PROVIDER_SITE_OTHER): Payer: Medicare Other

## 2015-07-07 ENCOUNTER — Institutional Professional Consult (permissible substitution): Payer: Medicare Other | Admitting: Emergency Medicine

## 2015-07-07 ENCOUNTER — Ambulatory Visit (INDEPENDENT_AMBULATORY_CARE_PROVIDER_SITE_OTHER): Payer: Medicare Other | Admitting: Internal Medicine

## 2015-07-07 VITALS — BP 132/68 | HR 69 | Ht 58.5 in | Wt 137.8 lb

## 2015-07-07 DIAGNOSIS — R911 Solitary pulmonary nodule: Secondary | ICD-10-CM | POA: Insufficient documentation

## 2015-07-07 DIAGNOSIS — J849 Interstitial pulmonary disease, unspecified: Secondary | ICD-10-CM

## 2015-07-07 LAB — SEDIMENTATION RATE: Sed Rate: 24 mm/hr (ref 0–30)

## 2015-07-07 NOTE — Progress Notes (Signed)
Subjective:    Patient ID: [name], female    DOB: [birth year], 82 y.o.   MRN: [id number] PCP Redge Gainer, MD   HPI  PCP Redge Gainer, MD   IOV 07/07/2015  Chief Complaint  Patient presents with  . Advice Only    Referred by Dr. Laurance Flatten for pulmonary nodule.       82 year old female accompanied by her daughter [name]. Referred for pulmonary nodule. According to the daughter and the patient will Sr. he is not fully clear with a long-standing history of pulmonary nodule that has been followed locally for many years review of the chart shows that she's had CT scan of the chest for many years. The CT scans of the chest. All the way back to 2004/2006 and described pulmonary fibrosis and nodule. I only have the images from 2014 that shows bilateral upper lobe pulmonary fibrosis with some involvement in the lower lobes. Non-UIP pattern. Most recently she had CT scan of the chest 06/30/2015. According to the daughter ration had some infectious symptoms at this time but again the CT was being done to evaluate for follow-up nodule and a new nodule was discovered which is described to be in the right upper lobe 6 mm and therefore has been referred here. Currently patient is feeling well baseline which is just mild intermittent occasional cough and shortness of breath with wheezing for which she is on maintenance inhaler possibly Brio which helps. Overall quality of life is fine and she does not wish for aggressive intervention.  I personally visualized the CT chest below. In terms of pulmonary fibrosis she is unaware of the diagnosis. Inour chart she's not had any autoimmune workup. She does live in an extremely old house over 62 years old. They think there might be some mold in it. She has chronic arthralgia but no diagnosed autoimmune disease   CT chest 06/30/15 EXAM: CT CHEST WITHOUT CONTRAST  TECHNIQUE: Multidetector CT imaging of the chest was performed following the standard  protocol without IV contrast.  COMPARISON: Chest radiograph 08/29/2015  FINDINGS: Mediastinum/Lymph Nodes: There are multiple borderline enlarged mediastinal lymph nodes, with reactive appearance. The heart is mildly enlarged. There is no pericardial effusion. Calcified atherosclerotic disease of the coronary arteries is seen.  Lungs/Pleura: There is no evidence of pneumothorax, pleural effusion or acute airspace consolidation. There are subpleural with upper lobe predominance fibrotic changes of the lungs bilaterally with coarsening of the interstitial markings, mild honeycombing and traction bronchiectasis. An area of scarring versus pulmonary nodule is seen in the subpleural right upper lobe and measures 6.4 mm (image 40/148, sequence 3). Mild pleural thickening is noted anteriorly at the level of the right middle lobe/ lingula.  Upper abdomen: No acute findings.  Musculoskeletal: No chest wall mass or suspicious bone lesions identified.  IMPRESSION: Upper lobe predominant chronic interstitial lung disease.  Multiple borderline enlarged mediastinal lymph nodes.  Differential diagnosis includes ankylosing spondylitis, sarcoidosis, silicosis, eosinophilic granuloma or prior granulomatous infection such as tuberculosis.  6.4 mm right upper lobe pulmonary nodule versus area of prominent scarring. Initial follow-up with CT at 6-12 months is recommended to confirm persistence. If persistent, repeat CT is recommended every 2 years until 5 years of stability has been established. This recommendation follows the consensus statement: Guidelines for Management of Incidental Pulmonary Nodules Detected on CT Images:From the Fleischner Society 2017; published online before print (10.1148/radiol.[id number]).   Electronically Signed  By: Fidela Salisbury M.D.  On: 06/30/2015 17:20    has a  past medical history of Backache, unspecified; Osteoarthrosis and allied  disorders; Peripheral neuropathy ([place]); Other and unspecified hyperlipidemia; Lumbar stenosis; Arthritis; Osteoporosis; Vitamin D deficiency; Cataract; and Glaucoma.   reports that she has never smoked. She has never used smokeless tobacco.  Past Surgical History  Procedure Laterality Date  . Lumbar laminectomy    . Knee arthroscopy    . Cataract extraction    . Eye surgery Bilateral     cataract  . Abdominal hysterectomy      Allergies  Allergen Reactions  . Levaquin [Levaquin Leva-Pak]     Patient cannot remember that she is sensitive to this medication; has never heard of it.  . Mucinex [Guaifenesin Er]     Causes anxiety  . Erythromycin Diarrhea  . Penicillins Rash    Immunization History  Administered Date(s) Administered  . Influenza Split 10/28/2012  . Influenza Whole 09/14/2009  . Influenza,inj,Quad PF,36+ Mos 10/24/2014  . Influenza,inj,quad, With Preservative 11/10/2013  . Pneumococcal Conjugate-13 12/31/2012  . Pneumococcal Polysaccharide-23 10/14/1996  . Td 10/15/2002  . Zoster 02/05/2010    Family History  Problem Relation Age of Onset  . Cancer Mother     bone  . Cancer Sister     breast  . Diabetes Brother   . Asthma Daughter   . COPD Son   . Asthma Son   . Cancer Sister     cervical     Current outpatient prescriptions:  .  alendronate (FOSAMAX) 70 MG tablet, TAKE 1 TABLET WEEKLY (TAKE WITH 8OZ OF WATER 30 MINUTES BEFORE BREAKFAST), Disp: 12 tablet, Rfl: 0 .  calcium carbonate (OS-CAL) 600 MG TABS tablet, Take 600 mg by mouth daily with breakfast., Disp: , Rfl:  .  cholecalciferol (VITAMIN D) 1000 UNITS tablet, Take 1,000 Units by mouth daily., Disp: , Rfl:  .  fluticasone (FLONASE) 50 MCG/ACT nasal spray, Place 2 sprays into both nostrils daily., Disp: 16 g, Rfl: 6 .  Multiple Vitamins-Minerals (CENTRUM SILVER) tablet, Take 1 tablet by mouth every evening., Disp: , Rfl:  .  simvastatin (ZOCOR) 80 MG tablet, Take 1 tablet (80 mg total) by mouth  at bedtime., Disp: 30 tablet, Rfl: 6     Review of Systems  Constitutional: Negative for fever and unexpected weight change.  HENT: Negative for congestion, dental problem, ear pain, nosebleeds, postnasal drip, rhinorrhea, sinus pressure, sneezing, sore throat and trouble swallowing.   Eyes: Negative for redness and itching.  Respiratory: Positive for cough and wheezing. Negative for chest tightness and shortness of breath.   Cardiovascular: Negative for palpitations and leg swelling.  Gastrointestinal: Negative for nausea and vomiting.  Genitourinary: Negative for dysuria.  Musculoskeletal: Negative for joint swelling.  Skin: Negative for rash.  Neurological: Negative for headaches.  Hematological: Does not bruise/bleed easily.  Psychiatric/Behavioral: Negative for dysphoric mood. The patient is not nervous/anxious.        Objective:   Physical Exam  Constitutional: She is oriented to person, place, and time. She appears well-developed and well-nourished. No distress.  Frail elderly female\\ Pleasant and looks well  HENT:  Head: Normocephalic and atraumatic.  Right Ear: External ear normal.  Left Ear: External ear normal.  Mouth/Throat: Oropharynx is clear and moist. No oropharyngeal exudate.  Eyes: Conjunctivae and EOM are normal. Pupils are equal, round, and reactive to light. Right eye exhibits no discharge. Left eye exhibits no discharge. No scleral icterus.  Neck: Normal range of motion. Neck supple. No JVD present. No tracheal deviation present. No thyromegaly present.  Cardiovascular: Normal rate, regular rhythm, normal heart sounds and intact distal pulses.  Exam reveals no gallop and no friction rub.   No murmur heard. Pulmonary/Chest: Effort normal and breath sounds normal. No respiratory distress. She has no wheezes. She has no rales. She exhibits no tenderness.  Mild upper lobe crackles  Abdominal: Soft. Bowel sounds are normal. She exhibits no distension and no  mass. There is no tenderness. There is no rebound and no guarding.  Musculoskeletal: Normal range of motion. She exhibits no edema or tenderness.  No clubbing Mild deformities of the hands  Lymphadenopathy:    She has no cervical adenopathy.  Neurological: She is alert and oriented to person, place, and time. She has normal reflexes. No cranial nerve deficit. She exhibits normal muscle tone. Coordination normal.  Skin: Skin is warm and dry. No rash noted. She is not diaphoretic. No erythema. No pallor.  Psychiatric: She has a normal mood and affect. Her behavior is normal. Judgment and thought content normal.  Vitals reviewed.   Filed Vitals:   07/07/15 1518  BP: 132/68  Pulse: 69  Height: 4' 10.5" (1.486 m)  Weight: 137 lb 12.8 oz (62.506 kg)  SpO2: 98%          Assessment & Plan:  .   ICD-9-CM ICD-10-CM   1. ILD (interstitial lung disease) (HCC) 515 J84.9 CT Chest Wo Contrast     ANA     Sed Rate (ESR)     Cyclic citrul peptide antibody, IgG     Rheumatoid factor     Anti-scleroderma antibody     Sjogrens syndrome-A extractable nuclear antibody     Sjogrens syndrome-B extractable nuclear antibody  2. Nodule of right lung 793.11 R91.1      ILD (interstitial lung disease) (HCC) - Differential diagnosis is autoimmune versus hypersensitivity pneumonitis versus NSIP versus IPF - do ANA, ESR, CCP, RF, Scl-70 and ssa/ssb antibody test - will call with results - if blood tests are negative ideally need biopsy but due to risk and long term stable nature of this will hold off - cotninue inhlaer Rx of pcp MOORE, DONALD, MD   Nodule of right lung  - do CT chest wo contrst in 6-9 months  Followup  6-28month after CT chest   For the severe iron deficiency results of pulmonaryDr. MBrand Males M.D., FSummerville Endoscopy CenterC.P Pulmonary and Critical Care Medicine Staff Physician CMillwoodPulmonary and Critical Care Pager: [phone] If no answer or between  15:00h -  7:00h: call [phone]  07/07/2015 4:33 PM

## 2015-07-07 NOTE — Patient Instructions (Signed)
ICD-9-CM ICD-10-CM   1. ILD (interstitial lung disease) ([place]) 515 J84.9   2. Nodule of right lung 793.11 R91.1    ILD (interstitial lung disease) (HCC) - do ANA, ESR, CCP, RF, Scl-70 and ssa/ssb antibody test - will call with results - if blood tests are negative ideally need biopsy but due to risk and long term stable nature of this will hold off - cotninue inhlaer Rx of pcp MOORE, Elenore Rota, MD   Nodule of right lung  - do CT chest wo contrst in 6-9 months  Followup  6-61month after CT chest

## 2015-07-07 NOTE — Addendum Note (Signed)
Addended by: Len Blalock on: 07/07/2015 05:03 PM   Modules accepted: Orders

## 2015-07-08 LAB — RHEUMATOID FACTOR: Rhuematoid fact SerPl-aCnc: 174 IU/mL — ABNORMAL HIGH (ref <=14)

## 2015-07-10 ENCOUNTER — Telehealth: Payer: Self-pay | Admitting: Internal Medicine

## 2015-07-10 ENCOUNTER — Telehealth: Payer: Self-pay | Admitting: Family Medicine

## 2015-07-10 ENCOUNTER — Encounter: Payer: Medicare Other | Admitting: *Deleted

## 2015-07-10 DIAGNOSIS — M65331 Trigger finger, right middle finger: Secondary | ICD-10-CM | POA: Diagnosis not present

## 2015-07-10 DIAGNOSIS — M05749 Rheumatoid arthritis with rheumatoid factor of unspecified hand without organ or systems involvement: Secondary | ICD-10-CM

## 2015-07-10 DIAGNOSIS — Z1231 Encounter for screening mammogram for malignant neoplasm of breast: Secondary | ICD-10-CM | POA: Diagnosis not present

## 2015-07-10 DIAGNOSIS — M19041 Primary osteoarthritis, right hand: Secondary | ICD-10-CM | POA: Diagnosis not present

## 2015-07-10 LAB — ANTI-SCLERODERMA ANTIBODY: SCLERODERMA (SCL-70) (ENA) ANTIBODY, IGG: NEGATIVE

## 2015-07-10 LAB — CYCLIC CITRUL PEPTIDE ANTIBODY, IGG: Cyclic Citrullin Peptide Ab: <16 Units

## 2015-07-10 LAB — SJOGRENS SYNDROME-A EXTRACTABLE NUCLEAR ANTIBODY: SSA (RO) (ENA) ANTIBODY, IGG: NEGATIVE

## 2015-07-10 LAB — SJOGRENS SYNDROME-B EXTRACTABLE NUCLEAR ANTIBODY: SSB (La) (ENA) Antibody, IgG: <1

## 2015-07-10 LAB — ANA: Anti Nuclear Antibody(ANA): NEGATIVE

## 2015-07-10 NOTE — Telephone Encounter (Signed)
I called and spoke to the patient's daughter and she is understanding of why we did not discuss this any center because things only change this time because the patient had auscultatory findings and did not have any findings prior to this time------ they are pleased with the evaluation by the pulmonologist and have plans to return for follow-up and all questions appeared to be answered sufficiently.

## 2015-07-10 NOTE — Telephone Encounter (Signed)
Let Vaughan Basta know that blood test for RF is strong positive. This might or might not be associated with Rheumatoid arthritis to explain her pain. REfer rheumatology if she is interested

## 2015-07-10 NOTE — Telephone Encounter (Signed)
Spoke with pt's daughter and advised you were out of the office until Thursday and explained on the past chest x-ray reports pt was aware so I wasn't sure. Pt's daughter is wondering why pt hadn't been sent to the pulmonologist prior to now. Advised pt's daughter she would need to discuss with you and she voiced understanding.

## 2015-07-12 NOTE — Telephone Encounter (Signed)
Patient's daughter notified of MR's recommendations. Referral placed to Rheumatologist. Nothing further needed.

## 2015-08-09 DIAGNOSIS — M65331 Trigger finger, right middle finger: Secondary | ICD-10-CM | POA: Diagnosis not present

## 2015-08-18 ENCOUNTER — Telehealth: Payer: Self-pay | Admitting: Family Medicine

## 2015-08-18 NOTE — Telephone Encounter (Signed)
Samples placed at front desk.  

## 2015-08-31 DIAGNOSIS — R5383 Other fatigue: Secondary | ICD-10-CM | POA: Diagnosis not present

## 2015-08-31 DIAGNOSIS — M4806 Spinal stenosis, lumbar region: Secondary | ICD-10-CM | POA: Diagnosis not present

## 2015-08-31 DIAGNOSIS — M79642 Pain in left hand: Secondary | ICD-10-CM | POA: Diagnosis not present

## 2015-08-31 DIAGNOSIS — J849 Interstitial pulmonary disease, unspecified: Secondary | ICD-10-CM | POA: Diagnosis not present

## 2015-08-31 DIAGNOSIS — M150 Primary generalized (osteo)arthritis: Secondary | ICD-10-CM | POA: Diagnosis not present

## 2015-08-31 DIAGNOSIS — M79641 Pain in right hand: Secondary | ICD-10-CM | POA: Diagnosis not present

## 2015-08-31 DIAGNOSIS — R768 Other specified abnormal immunological findings in serum: Secondary | ICD-10-CM | POA: Diagnosis not present

## 2015-09-05 ENCOUNTER — Other Ambulatory Visit: Payer: Self-pay | Admitting: Family Medicine

## 2015-09-08 ENCOUNTER — Ambulatory Visit (INDEPENDENT_AMBULATORY_CARE_PROVIDER_SITE_OTHER): Payer: Medicare Other | Admitting: Urology

## 2015-09-08 DIAGNOSIS — N3020 Other chronic cystitis without hematuria: Secondary | ICD-10-CM | POA: Diagnosis not present

## 2015-09-08 DIAGNOSIS — N3946 Mixed incontinence: Secondary | ICD-10-CM

## 2015-10-09 ENCOUNTER — Telehealth: Payer: Self-pay | Admitting: Family Medicine

## 2015-10-09 MED ORDER — FLUTICASONE FUROATE-VILANTEROL 100-25 MCG/INH IN AEPB: 1.0000 | INHALATION_SPRAY | Freq: Every day | RESPIRATORY_TRACT | 1 refills | Status: DC

## 2015-10-09 NOTE — Telephone Encounter (Signed)
Breo sent to pharmacy

## 2015-10-09 NOTE — Telephone Encounter (Signed)
It is okay to send in a prescription for her Brio 1 puff once daily and rinse mouth after use with as needed refills

## 2015-10-09 NOTE — Telephone Encounter (Signed)
We do not have any Samples of Breo 100.  Can you please send in Rx

## 2015-11-08 ENCOUNTER — Encounter: Payer: Self-pay | Admitting: Family Medicine

## 2015-11-08 ENCOUNTER — Ambulatory Visit (INDEPENDENT_AMBULATORY_CARE_PROVIDER_SITE_OTHER): Payer: Medicare Other | Admitting: Family Medicine

## 2015-11-08 ENCOUNTER — Encounter (INDEPENDENT_AMBULATORY_CARE_PROVIDER_SITE_OTHER): Payer: Self-pay

## 2015-11-08 VITALS — BP 142/64 | HR 63 | Temp 96.0°F | Ht 58.5 in | Wt 131.0 lb

## 2015-11-08 DIAGNOSIS — E7800 Pure hypercholesterolemia, unspecified: Secondary | ICD-10-CM

## 2015-11-08 DIAGNOSIS — R300 Dysuria: Secondary | ICD-10-CM | POA: Diagnosis not present

## 2015-11-08 DIAGNOSIS — D531 Other megaloblastic anemias, not elsewhere classified: Secondary | ICD-10-CM | POA: Diagnosis not present

## 2015-11-08 DIAGNOSIS — Z23 Encounter for immunization: Secondary | ICD-10-CM

## 2015-11-08 DIAGNOSIS — E559 Vitamin D deficiency, unspecified: Secondary | ICD-10-CM | POA: Diagnosis not present

## 2015-11-08 LAB — URINALYSIS, MICROSCOPIC ONLY

## 2015-11-08 NOTE — Addendum Note (Signed)
Addended by: Zannie Cove on: 11/08/2015 12:08 PM   Modules accepted: Orders

## 2015-11-08 NOTE — Patient Instructions (Addendum)
Medicare Annual Wellness Visit  [facility] and the medical providers at [facility] strive to bring you the best medical care.  In doing so we not only want to address your current medical conditions and concerns but also to detect new conditions early and prevent illness, disease and health-related problems.    Medicare offers a yearly Wellness Visit which allows our clinical staff to assess your need for preventative services including immunizations, lifestyle education, counseling to decrease risk of preventable diseases and screening for fall risk and other medical concerns.    This visit is provided free of charge (no copay) for all Medicare recipients. The clinical pharmacists at [facility] have begun to conduct these Wellness Visits which will also include a thorough review of all your medications.    As you primary medical provider recommend that you make an appointment for your Annual Wellness Visit if you have not done so already this year.  You may set up this appointment before you leave today or you may call back [phone] and schedule an appointment.  Please make sure when you call that you mention that you are scheduling your Annual Wellness Visit with the clinical pharmacist so that the appointment may be made for the proper length of time.     Continue current medications. Continue good therapeutic lifestyle changes which include good diet and exercise. Fall precautions discussed with patient. If an FOBT was given today- please return it to our front desk. If you are over 55 years old - you may need Prevnar 68 or the adult Pneumonia vaccine.  **Flu shots are available--- please call and schedule a FLU-CLINIC appointment**  After your visit with [place] today you will receive a survey in the mail or online from [employer] regarding your care with [place]. Please take a moment to fill this out. Your feedback is very  important to [place] as you can help [place] better understand your patient needs as well as improve your experience and satisfaction. WE CARE ABOUT YOU!!!   Continue to drink plenty of fluids and stay well hydrated Continue to be careful do not put yourself at risk for falling Be sure and follow-up with Dr. Juanell Fairly a swami as planned Follow-up with Dr. Leeanne Deed if any increasing back pain Try one half of a Claritin and continue to use Flonase and saline nose spray

## 2015-11-08 NOTE — Progress Notes (Signed)
Subjective:    Patient ID: [name], female    DOB: 1925/12/31, 82 y.o.   MRN: [id number]  HPI Pt here for follow up and management of chronic medical problems which includes hyperlipidemia. She is taking medications regularly.The patient comes to the visit today with her daughter. Her main complaints are seasonal allergies and she is on Flonase and uncomfortable voiding. She will get her flu shot today and have her lab work done. The patient is pleasant kind and alert. She has seen Dr. Jeffie Pollock about her recurrent urinary tract infections in the past and is no longer taking Macrodantin. She is drinking bottled water. She is now having more painful voiding and it has come on gradually. She denies any chest pain or shortness of breath. She denies any problems with her stomach including nausea vomiting diarrhea blood in the stool or black tarry bowel movements. He does have a history of interstitial lung disease and has seen Dr. Chase Caller for this. She has a return appointment plan to see him in the future. She also had her trigger finger injected by Dr. Daryll Brod. This is better.    Patient Active Problem List   Diagnosis Date Noted  . ILD (interstitial lung disease) ([place]) 07/07/2015  . Nodule of right lung 07/07/2015  . Chronic cystitis 01/08/2013  . Vitamin D deficiency 12/31/2012  . Osteoporosis 12/31/2012  . Spinal stenosis of lumbar region 12/31/2012  . Hyperlipidemia 07/16/2012  . Chronic low back pain 07/16/2012  . Vitamin B12 deficient megaloblastic anemia 07/16/2012   Outpatient Encounter Prescriptions as of 11/08/2015  Medication Sig  . alendronate (FOSAMAX) 70 MG tablet TAKE 1 TABLET WEEKLY (TAKE WITH 8OZ OF WATER 30 MINUTES BEFORE BREAKFAST)  . calcium carbonate (OS-CAL) 600 MG TABS tablet Take 600 mg by mouth daily with breakfast.  . cholecalciferol (VITAMIN D) 1000 UNITS tablet Take 1,000 Units by mouth daily.  . fluticasone (FLONASE) 50 MCG/ACT nasal spray Place 2 sprays  into both nostrils daily.  . fluticasone furoate-vilanterol (BREO ELLIPTA) 100-25 MCG/INH AEPB Inhale 1 puff into the lungs daily.  . Multiple Vitamins-Minerals (CENTRUM SILVER) tablet Take 1 tablet by mouth every evening.  . simvastatin (ZOCOR) 80 MG tablet TAKE ONE TABLET DAILY AT BEDTIME   No facility-administered encounter medications on file as of 11/08/2015.       Review of Systems  Constitutional: Negative.   HENT: Negative.        Seasonal allergies  Eyes: Negative.   Respiratory: Negative.   Cardiovascular: Negative.   Gastrointestinal: Negative.   Endocrine: Negative.   Genitourinary: Positive for dysuria.  Musculoskeletal: Negative.   Skin: Negative.   Allergic/Immunologic: Negative.   Neurological: Negative.   Hematological: Negative.   Psychiatric/Behavioral: Negative.        Objective:   Physical Exam  Constitutional: She is oriented to person, place, and time. She appears well-developed and well-nourished. No distress.  The patient is pleasant elderly and alert. She lives by herself but she is monitored closely by her children.  HENT:  Head: Normocephalic and atraumatic.  Right Ear: External ear normal.  Left Ear: External ear normal.  Mouth/Throat: Oropharynx is clear and moist. No oropharyngeal exudate.  Minimal nasal congestion  Eyes: Conjunctivae and EOM are normal. Pupils are equal, round, and reactive to light. Right eye exhibits no discharge. Left eye exhibits no discharge. No scleral icterus.  Neck: Normal range of motion. Neck supple. No thyromegaly present.  Cardiovascular: Normal rate, regular rhythm, normal heart sounds and  intact distal pulses.   No murmur heard. The heart has a regular rate and rhythm at 64/m  Pulmonary/Chest: Effort normal and breath sounds normal. No respiratory distress. She has no wheezes. She has no rales. She exhibits no tenderness.  Clear anteriorly and posteriorly  Abdominal: Soft. Bowel sounds are normal. She exhibits  no mass. There is no tenderness. There is no rebound and no guarding.  No abdominal tenderness masses or bruits  Musculoskeletal: Normal range of motion. She exhibits no edema.  Lymphadenopathy:    She has no cervical adenopathy.  Neurological: She is alert and oriented to person, place, and time. She has normal reflexes. No cranial nerve deficit.  Skin: Skin is warm and dry. No rash noted.  Psychiatric: She has a normal mood and affect. Her behavior is normal. Judgment and thought content normal.  Nursing note and vitals reviewed.  BP (!) 142/64 (BP Location: Right Arm)   Pulse 63   Temp (!) 96 F (35.6 C) (Oral)   Ht 4' 10.5" (1.486 m)   Wt 131 lb (59.4 kg)   BMI 26.91 kg/m         Assessment & Plan:  1. Vitamin D deficiency -Continue current treatment pending results of lab work - CBC with Differential/Platelet - VITAMIN D 25 Hydroxy (Vit-D Deficiency, Fractures)  2. Pure hypercholesterolemia -Continue current treatment pending results of lab work, also continue with as aggressive therapeutic lifestyle changes as possible - BMP8+EGFR - CBC with Differential/Platelet - Hepatic function panel - Lipid panel  3. Vitamin B12 deficient megaloblastic anemia - CBC with Differential/Platelet  4. Dysuria -Consider restarting Macrodantin a stone results of urinalysis and any culture that his diet - Urinalysis, Complete - Urine culture Patient Instructions                       Medicare Annual Wellness Visit  [facility] and the medical providers at [facility] strive to bring you the best medical care.  In doing so we not only want to address your current medical conditions and concerns but also to detect new conditions early and prevent illness, disease and health-related problems.    Medicare offers a yearly Wellness Visit which allows our clinical staff to assess your need for preventative services including immunizations, lifestyle education,  counseling to decrease risk of preventable diseases and screening for fall risk and other medical concerns.    This visit is provided free of charge (no copay) for all Medicare recipients. The clinical pharmacists at [facility] have begun to conduct these Wellness Visits which will also include a thorough review of all your medications.    As you primary medical provider recommend that you make an appointment for your Annual Wellness Visit if you have not done so already this year.  You may set up this appointment before you leave today or you may call back [phone] and schedule an appointment.  Please make sure when you call that you mention that you are scheduling your Annual Wellness Visit with the clinical pharmacist so that the appointment may be made for the proper length of time.     Continue current medications. Continue good therapeutic lifestyle changes which include good diet and exercise. Fall precautions discussed with patient. If an FOBT was given today- please return it to our front desk. If you are over 81 years old - you may need Prevnar 47 or the adult Pneumonia vaccine.  **Flu shots are available---  please call and schedule a FLU-CLINIC appointment**  After your visit with [place] today you will receive a survey in the mail or online from [employer] regarding your care with [place]. Please take a moment to fill this out. Your feedback is very important to [place] as you can help [place] better understand your patient needs as well as improve your experience and satisfaction. WE CARE ABOUT YOU!!!   Continue to drink plenty of fluids and stay well hydrated Continue to be careful do not put yourself at risk for falling Be sure and follow-up with Dr. Juanell Fairly a swami as planned Follow-up with Dr. Leeanne Deed if any increasing back pain Try one half of a Claritin and continue to use Flonase and saline nose spray   Arrie Senate MD   .

## 2015-11-09 LAB — LIPID PANEL
CHOLESTEROL TOTAL: 156 mg/dL (ref 100–199)
Chol/HDL Ratio: 2.8 ratio units (ref 0.0–4.4)
HDL: 56 mg/dL (ref >39)
LDL Calculated: 77 mg/dL (ref 0–99)
Triglycerides: 116 mg/dL (ref 0–149)
VLDL CHOLESTEROL CAL: 23 mg/dL (ref 5–40)

## 2015-11-09 LAB — BMP8+EGFR
BUN / CREAT RATIO: 10 — AB (ref 12–28)
BUN: 8 mg/dL (ref 8–27)
CO2: 26 mmol/L (ref 18–29)
Calcium: 9 mg/dL (ref 8.7–10.3)
Chloride: 104 mmol/L (ref 96–106)
Creatinine, Ser: 0.77 mg/dL (ref 0.57–1.00)
GFR, EST AFRICAN AMERICAN: 80 mL/min/{1.73_m2} (ref >59)
GFR, EST NON AFRICAN AMERICAN: 69 mL/min/{1.73_m2} (ref >59)
Glucose: 93 mg/dL (ref 65–99)
POTASSIUM: 4.7 mmol/L (ref 3.5–5.2)
Sodium: 144 mmol/L (ref 134–144)

## 2015-11-09 LAB — CBC WITH DIFFERENTIAL/PLATELET
BASOS: 1 %
Basophils Absolute: 0 10*3/uL (ref 0.0–0.2)
EOS (ABSOLUTE): 0.2 10*3/uL (ref 0.0–0.4)
Eos: 3 %
Hematocrit: 38.3 % (ref 34.0–46.6)
Hemoglobin: 12.7 g/dL (ref 11.1–15.9)
IMMATURE GRANS (ABS): 0 10*3/uL (ref 0.0–0.1)
Immature Granulocytes: 0 %
LYMPHS ABS: 1.2 10*3/uL (ref 0.7–3.1)
LYMPHS: 18 %
MCH: 28 pg (ref 26.6–33.0)
MCHC: 33.2 g/dL (ref 31.5–35.7)
MCV: 84 fL (ref 79–97)
MONOS ABS: 0.7 10*3/uL (ref 0.1–0.9)
Monocytes: 11 %
NEUTROS ABS: 4.4 10*3/uL (ref 1.4–7.0)
Neutrophils: 67 %
PLATELETS: 250 10*3/uL (ref 150–379)
RBC: 4.54 x10E6/uL (ref 3.77–5.28)
RDW: 13.7 % (ref 12.3–15.4)
WBC: 6.5 10*3/uL (ref 3.4–10.8)

## 2015-11-09 LAB — HEPATIC FUNCTION PANEL
ALK PHOS: 79 IU/L (ref 39–117)
ALT: 8 IU/L (ref 0–32)
AST: 25 IU/L (ref 0–40)
Albumin: 4.1 g/dL (ref 3.5–4.7)
BILIRUBIN, DIRECT: 0.12 mg/dL (ref 0.00–0.40)
Bilirubin Total: 0.4 mg/dL (ref 0.0–1.2)
Total Protein: 6.4 g/dL (ref 6.0–8.5)

## 2015-11-09 LAB — VITAMIN D 25 HYDROXY (VIT D DEFICIENCY, FRACTURES): Vit D, 25-Hydroxy: 40.5 ng/mL (ref 30.0–100.0)

## 2015-11-10 LAB — URINE CULTURE

## 2015-12-19 ENCOUNTER — Ambulatory Visit (HOSPITAL_COMMUNITY)
Admission: RE | Admit: 2015-12-19 | Discharge: 2015-12-19 | Disposition: A | Discharge status: Home / Self Care | Payer: Medicare Other | Source: Ambulatory Visit | Attending: Internal Medicine | Admitting: Internal Medicine

## 2015-12-19 DIAGNOSIS — J849 Interstitial pulmonary disease, unspecified: Secondary | ICD-10-CM | POA: Diagnosis not present

## 2015-12-19 DIAGNOSIS — R918 Other nonspecific abnormal finding of lung field: Secondary | ICD-10-CM | POA: Diagnosis not present

## 2016-01-01 ENCOUNTER — Telehealth: Payer: Self-pay | Admitting: Internal Medicine

## 2016-01-01 NOTE — Telephone Encounter (Signed)
Called and spoke to pt's daughter, [name]. Pt is requesting results of CT chest.   Dr. Chase Caller please advise. Thanks.

## 2016-01-02 ENCOUNTER — Other Ambulatory Visit: Payer: Self-pay | Admitting: Family Medicine

## 2016-01-02 ENCOUNTER — Ambulatory Visit: Payer: Medicare Other | Admitting: Family Medicine

## 2016-01-02 DIAGNOSIS — S0993XA Unspecified injury of face, initial encounter: Secondary | ICD-10-CM | POA: Diagnosis not present

## 2016-01-02 DIAGNOSIS — R51 Headache: Secondary | ICD-10-CM | POA: Diagnosis not present

## 2016-01-02 DIAGNOSIS — S0083XA Contusion of other part of head, initial encounter: Secondary | ICD-10-CM | POA: Diagnosis not present

## 2016-01-02 DIAGNOSIS — M25512 Pain in left shoulder: Secondary | ICD-10-CM | POA: Diagnosis not present

## 2016-01-02 DIAGNOSIS — R079 Chest pain, unspecified: Secondary | ICD-10-CM | POA: Diagnosis not present

## 2016-01-02 DIAGNOSIS — S40012A Contusion of left shoulder, initial encounter: Secondary | ICD-10-CM | POA: Diagnosis not present

## 2016-01-02 DIAGNOSIS — R030 Elevated blood-pressure reading, without diagnosis of hypertension: Secondary | ICD-10-CM | POA: Diagnosis not present

## 2016-01-02 DIAGNOSIS — S20212A Contusion of left front wall of thorax, initial encounter: Secondary | ICD-10-CM | POA: Diagnosis not present

## 2016-01-02 DIAGNOSIS — Z79899 Other long term (current) drug therapy: Secondary | ICD-10-CM | POA: Diagnosis not present

## 2016-01-02 DIAGNOSIS — M810 Age-related osteoporosis without current pathological fracture: Secondary | ICD-10-CM | POA: Diagnosis not present

## 2016-01-02 DIAGNOSIS — S4992XA Unspecified injury of left shoulder and upper arm, initial encounter: Secondary | ICD-10-CM | POA: Diagnosis not present

## 2016-01-02 DIAGNOSIS — E7800 Pure hypercholesterolemia, unspecified: Secondary | ICD-10-CM | POA: Diagnosis not present

## 2016-01-02 DIAGNOSIS — M542 Cervicalgia: Secondary | ICD-10-CM | POA: Diagnosis not present

## 2016-01-02 DIAGNOSIS — S0990XA Unspecified injury of head, initial encounter: Secondary | ICD-10-CM | POA: Diagnosis not present

## 2016-01-02 DIAGNOSIS — S199XXA Unspecified injury of neck, initial encounter: Secondary | ICD-10-CM | POA: Diagnosis not present

## 2016-01-02 DIAGNOSIS — W010XXA Fall on same level from slipping, tripping and stumbling without subsequent striking against object, initial encounter: Secondary | ICD-10-CM | POA: Diagnosis not present

## 2016-01-02 NOTE — Telephone Encounter (Signed)
Spoke with daughter and gave her MR recc. She understood, appointment was made. She said she would try to call to see if he has any cancellations to see if she can come in sooner than her appointment. Nothing further is needed at this time.

## 2016-01-02 NOTE — Telephone Encounter (Signed)
CTc hest 125/17  - no change in nodules or pulm fibrosis compared to June 2017  Plan  0-give first avail to fu ILD  - needs fu CT chest in 1 year but I can order it at fu depending oon goals of care  Thanks  Dr. Brand Males, M.D., [facility].C.P Pulmonary and Critical Care Medicine Staff Physician [facility] Pager: [phone], If no answer or between  15:00h - 7:00h: call [phone]  01/02/2016 4:09 PM      CLINICAL DATA:  Followup pulmonary nodules  EXAM: CT CHEST WITHOUT CONTRAST  TECHNIQUE: Multidetector CT imaging of the chest was performed following the standard protocol without IV contrast.  COMPARISON:  06/30/2015  FINDINGS: Cardiovascular: The heart size appears mildly enlarged. There is aortic atherosclerosis noted. Calcification within the LAD, left circumflex and RCA coronary artery noted.  Mediastinum/Nodes: The trachea appears patent and is midline. Normal appearance of the esophagus. Similar appearance of prominent mediastinal lymph nodes.  Lungs/Pleura: No pleural effusion. There is bilateral interstitial reticulation with subpleural honeycombing and traction bronchiectasis without zonal predominance compatible with chronic interstitial lung disease. Small pulmonary nodules are again identified. The index nodule within the right upper lobe is again noted. This measures 5 mm, image 45 of series 3. New from 2014 by unchanged from 06/30/2012. Subpleural nodule within the right lobe measures 4 mm, image 90 of series 3. This is compared with 4 mm previously. Perifissural nodule along the oblique fissure measures 0.8 x 0.4 cm, image 78 of series 3. Unchanged from 06/30/2012.  Upper Abdomen: No acute abnormality identified.  Musculoskeletal: There is spondylosis noted within the thoracic spine. No aggressive lytic or sclerotic bone lesions identified.  IMPRESSION: 1. Stable CT of the  chest. 2. Similar appearance of chronic interstitial lung disease compatible with pulmonary fibrosis. This may be secondary to nonspecific interstitial pneumonia or usual interstitial pneumonia (UIP.) 3. Small pulmonary nodules are again noted and appears stable from 06/30/2015. Non-contrast chest CT can be considered at 12 months (from 06/30/2012) if patient is high-risk. This recommendation follows the consensus statement: Guidelines for Management of Incidental Pulmonary Nodules Detected on CT Images: From the Fleischner Society 2017; Radiology 2017; [id number]. 4.   Electronically Signed   By: Queen Slough.D.

## 2016-01-03 ENCOUNTER — Telehealth: Payer: Self-pay | Admitting: Family Medicine

## 2016-01-03 NOTE — Telephone Encounter (Signed)
appt scheduled Pt notified 

## 2016-01-05 ENCOUNTER — Ambulatory Visit (INDEPENDENT_AMBULATORY_CARE_PROVIDER_SITE_OTHER): Payer: Medicare Other | Admitting: Urology

## 2016-01-05 ENCOUNTER — Other Ambulatory Visit (HOSPITAL_COMMUNITY)
Admission: AD | Admit: 2016-01-05 | Discharge: 2016-01-05 | Disposition: A | Discharge status: Home / Self Care | Payer: Medicare Other | Source: Skilled Nursing Facility | Attending: Urology | Admitting: Urology

## 2016-01-05 DIAGNOSIS — N3946 Mixed incontinence: Secondary | ICD-10-CM | POA: Diagnosis not present

## 2016-01-05 DIAGNOSIS — N3020 Other chronic cystitis without hematuria: Secondary | ICD-10-CM

## 2016-01-05 LAB — URINALYSIS, COMPLETE (UACMP) WITH MICROSCOPIC
BACTERIA UA: NONE SEEN
Bilirubin Urine: NEGATIVE
Glucose, UA: NEGATIVE mg/dL
Ketones, ur: NEGATIVE mg/dL
Nitrite: NEGATIVE
PROTEIN: NEGATIVE mg/dL
SPECIFIC GRAVITY, URINE: 1.018 (ref 1.005–1.030)
pH: 5 (ref 5.0–8.0)

## 2016-01-07 LAB — URINE CULTURE

## 2016-01-10 ENCOUNTER — Encounter: Payer: Self-pay | Admitting: Family Medicine

## 2016-01-10 ENCOUNTER — Ambulatory Visit (INDEPENDENT_AMBULATORY_CARE_PROVIDER_SITE_OTHER): Payer: Medicare Other | Admitting: Family Medicine

## 2016-01-10 VITALS — BP 156/64 | HR 62 | Temp 97.1°F | Ht 58.5 in | Wt 134.0 lb

## 2016-01-10 DIAGNOSIS — S0083XA Contusion of other part of head, initial encounter: Secondary | ICD-10-CM | POA: Diagnosis not present

## 2016-01-10 DIAGNOSIS — S0083XD Contusion of other part of head, subsequent encounter: Secondary | ICD-10-CM

## 2016-01-10 DIAGNOSIS — Z9181 History of falling: Secondary | ICD-10-CM | POA: Diagnosis not present

## 2016-01-10 NOTE — Patient Instructions (Signed)
The patient should continue to be careful not put herself at risk for falling She should learn from the recent experience and make sure that her dog does not get in her way the next time. She would also benefit from a Lifeline. She is sometime at home by herself.

## 2016-01-10 NOTE — Progress Notes (Signed)
Subjective:    Patient ID: [name], female    DOB: May 21, 1925, 82 y.o.   MRN: [id number]  HPI Patient here today for hospital follow from a fall that happened last Tuesday. She went to [facility] and Ct-head and left shoulder xrays were taken and normal.The patient fell 8 days ago. She tripped over a dog. She landed on her left side and her left shoulder. She was taken to the emergency room at [facility] in [place]. She had facial bruising and hit her head in the fall. She had x-rays of the shoulder and a CT scan of the head in both of these were within normal limits. She feels better now. She is not having any complaints today. Her vital signs are stable except her systolic blood pressure is elevated. The patient is in good spirits today she comes with her daughter. She truly denies any chest pain soreness or discomfort from the fall over 8 days ago.    Patient Active Problem List   Diagnosis Date Noted  . ILD (interstitial lung disease) ([place]) 07/07/2015  . Nodule of right lung 07/07/2015  . Chronic cystitis 01/08/2013  . Vitamin D deficiency 12/31/2012  . Osteoporosis 12/31/2012  . Spinal stenosis of lumbar region 12/31/2012  . Hyperlipidemia 07/16/2012  . Chronic low back pain 07/16/2012  . Vitamin B12 deficient megaloblastic anemia 07/16/2012   Outpatient Encounter Prescriptions as of 01/10/2016  Medication Sig  . alendronate (FOSAMAX) 70 MG tablet TAKE 1 TABLET WEEKLY (TAKE WITH 8OZ OF WATER 30 MINUTES BEFORE BREAKFAST)  . BREO ELLIPTA 100-25 MCG/INH AEPB USE 1 INHALATION DAILY  . calcium carbonate (OS-CAL) 600 MG TABS tablet Take 600 mg by mouth daily with breakfast.  . cholecalciferol (VITAMIN D) 1000 UNITS tablet Take 1,000 Units by mouth daily.  . fluticasone (FLONASE) 50 MCG/ACT nasal spray Place 2 sprays into both nostrils daily.  . Multiple Vitamins-Minerals (CENTRUM SILVER) tablet Take 1 tablet by mouth every evening.  . simvastatin (ZOCOR) 80 MG tablet  TAKE ONE TABLET DAILY AT BEDTIME  . trimethoprim (TRIMPEX) 100 MG tablet    No facility-administered encounter medications on file as of 01/10/2016.       Review of Systems  Constitutional: Negative.   HENT: Negative.   Eyes: Negative.   Respiratory: Negative.   Cardiovascular: Negative.   Gastrointestinal: Negative.   Endocrine: Negative.   Genitourinary: Negative.   Musculoskeletal: Negative.   Skin: Negative.   Allergic/Immunologic: Negative.   Neurological: Negative.   Hematological: Negative.   Psychiatric/Behavioral: Negative.        Objective:   Physical Exam  Constitutional: She is oriented to person, place, and time. She appears well-developed and well-nourished.  Elderly small framed but pleasant and alert  HENT:  Head: Normocephalic and atraumatic.  Right Ear: External ear normal.  Left Ear: External ear normal.  Nose: Nose normal.  Mouth/Throat: Oropharynx is clear and moist. No oropharyngeal exudate.  Eyes: Conjunctivae and EOM are normal. Pupils are equal, round, and reactive to light. Right eye exhibits no discharge. Left eye exhibits no discharge. No scleral icterus.  Neck: Normal range of motion. Neck supple. No thyromegaly present.  Good mobility  Cardiovascular: Normal rate, regular rhythm and normal heart sounds.   No murmur heard. Pulmonary/Chest: Effort normal and breath sounds normal. No respiratory distress. She has no wheezes. She has no rales. She exhibits no tenderness.  Abdominal: Soft. Bowel sounds are normal. She exhibits no mass. There is no tenderness. There is no  rebound and no guarding.  Musculoskeletal: Normal range of motion. She exhibits no edema.  Lymphadenopathy:    She has no cervical adenopathy.  Neurological: She is alert and oriented to person, place, and time.  Skin: Skin is warm and dry. No rash noted.  Psychiatric: She has a normal mood and affect. Her behavior is normal. Judgment and thought content normal.  Nursing note  and vitals reviewed.   BP (!) 156/64 (BP Location: Right Arm)   Pulse 62   Temp 97.1 F (36.2 C) (Oral)   Ht 4' 10.5" (1.486 m)   Wt 134 lb (60.8 kg)   BMI 27.53 kg/m        Assessment & Plan:  1. Contusion of face, subsequent encounter -The contusion of her left face appears to be resolving. In addition to this she has good mobility of her left arm and shoulder and good mobility of her chest wall without pain. She had a negative CT of the head in the hospital and negative x-rays of her left shoulder. As result of this we are encouraging the daughter that she should get a Lifeline for her mother to have at home. I think they're interested in this. We will give her a brochure for this.  Patient Instructions  The patient should continue to be careful not put herself at risk for falling She should learn from the recent experience and make sure that her dog does not get in her way the next time. She would also benefit from a Lifeline. She is sometime at home by herself.  Arrie Senate MD

## 2016-02-15 ENCOUNTER — Encounter: Payer: Self-pay | Admitting: Internal Medicine

## 2016-02-15 ENCOUNTER — Ambulatory Visit (INDEPENDENT_AMBULATORY_CARE_PROVIDER_SITE_OTHER): Payer: Medicare Other | Admitting: Internal Medicine

## 2016-02-15 VITALS — BP 116/60 | HR 62 | Ht 58.5 in | Wt 128.4 lb

## 2016-02-15 DIAGNOSIS — J849 Interstitial pulmonary disease, unspecified: Secondary | ICD-10-CM

## 2016-02-15 DIAGNOSIS — R911 Solitary pulmonary nodule: Secondary | ICD-10-CM

## 2016-02-15 NOTE — Addendum Note (Signed)
Addended by: Collier Salina on: 02/15/2016 12:11 PM   Modules accepted: Orders

## 2016-02-15 NOTE — Patient Instructions (Addendum)
ICD-9-CM ICD-10-CM   1. ILD (interstitial lung disease) ([place]) 515 J84.9   2. Nodule of right lung 793.11 R91.1    ILD (interstitial lung disease) ([place]) - stop breo - Likely IPF - slowly progressive 2008 -> 2013 -> 2017 CT chest - do not advise anti-fibrotic therapy due to side effects  - wil clinically monitor you - be as active as you can  - refer BI registry study - thanks for participating  - walk test on room air at next visit  Nodule of right lung  - do CT chest wo contrst in 11 months from 02/15/2016 - dec 2018  Followup 6 months or sooner if needed

## 2016-02-15 NOTE — Addendum Note (Signed)
Addended by: Collier Salina on: 02/15/2016 12:16 PM   Modules accepted: Orders

## 2016-02-15 NOTE — Progress Notes (Signed)
Subjective:     Patient ID: [name], female   DOB: 1925/08/13, 83 y.o.   MRN: [id number]  HPI PCP Redge Gainer, MD   HPI  PCP Redge Gainer, MD   IOV 07/07/2015  Chief Complaint  Patient presents with  . Advice Only    Referred by Dr. Laurance Flatten for pulmonary nodule.       83 year old female accompanied by her daughter [name]. Referred for pulmonary nodule. According to the daughter and the patient will Sr. he is not fully clear with a long-standing history of pulmonary nodule that has been followed locally for many years review of the chart shows that she's had CT scan of the chest for many years. The CT scans of the chest. All the way back to 2004/2006 and described pulmonary fibrosis and nodule. I only have the images from 2014 that shows bilateral upper lobe pulmonary fibrosis with some involvement in the lower lobes. Non-UIP pattern. Most recently she had CT scan of the chest 06/30/2015. According to the daughter ration had some infectious symptoms at this time but again the CT was being done to evaluate for follow-up nodule and a new nodule was discovered which is described to be in the right upper lobe 6 mm and therefore has been referred here. Currently patient is feeling well baseline which is just mild intermittent occasional cough and shortness of breath with wheezing for which she is on maintenance inhaler possibly Brio which helps. Overall quality of life is fine and she does not wish for aggressive intervention.  I personally visualized the CT chest below. In terms of pulmonary fibrosis she is unaware of the diagnosis. Inour chart she's not had any autoimmune workup. She does live in an extremely old house over 39 years old. They think there might be some mold in it. She has chronic arthralgia but no diagnosed autoimmune disease   CT chest 06/30/15 EXAM: CT CHEST WITHOUT CONTRAST  TECHNIQUE: Multidetector CT imaging of the chest was performed following the standard  protocol without IV contrast.  COMPARISON: Chest radiograph 08/29/2015  FINDINGS: Mediastinum/Lymph Nodes: There are multiple borderline enlarged mediastinal lymph nodes, with reactive appearance. The heart is mildly enlarged. There is no pericardial effusion. Calcified atherosclerotic disease of the coronary arteries is seen.  Lungs/Pleura: There is no evidence of pneumothorax, pleural effusion or acute airspace consolidation. There are subpleural with upper lobe predominance fibrotic changes of the lungs bilaterally with coarsening of the interstitial markings, mild honeycombing and traction bronchiectasis. An area of scarring versus pulmonary nodule is seen in the subpleural right upper lobe and measures 6.4 mm (image 40/148, sequence 3). Mild pleural thickening is noted anteriorly at the level of the right middle lobe/ lingula.  Upper abdomen: No acute findings.  Musculoskeletal: No chest wall mass or suspicious bone lesions identified.  IMPRESSION: Upper lobe predominant chronic interstitial lung disease.  Multiple borderline enlarged mediastinal lymph nodes.  Differential diagnosis includes ankylosing spondylitis, sarcoidosis, silicosis, eosinophilic granuloma or prior granulomatous infection such as tuberculosis.  6.4 mm right upper lobe pulmonary nodule versus area of prominent scarring. Initial follow-up with CT at 6-12 months is recommended to confirm persistence. If persistent, repeat CT is recommended every 2 years until 5 years of stability has been established. This recommendation follows the consensus statement: Guidelines for Management of Incidental Pulmonary Nodules Detected on CT Images:From the Fleischner Society 2017; published online before print (10.1148/radiol.IJ:2314499).   Electronically Signed  By: Fidela Salisbury M.D.  On: 06/30/2015 17:20  OV 02/15/2016  Chief Complaint  Patient presents with  . Follow-up    Pt here  after CT chest. Pt states her breathing is unchanged since last OV. Pt denies cough and CP/tightness.    Follow-up 6 mm right upper lobe nodule  In follow-up interstitial lung disease not otherwise specified base in June 2017 CT chest  She had follow-up CT chest recommended below that at personally visualized with her and her daughter [name]. According to me the right upper lobe nodule is stable and I agree with the report. The interstitial lung disease pattern appears to be one of possible UIP. She has an elevated rheumatoid factor but negative CCP. Did tell me that they saw Dr. Trudie Reed rheumatologist and has been reassured that there is no systemic evidence of rheumatoid arthritis. I do not have the chart with me. Overall she's stable but does get dyspneic doing gardening and then relieved by rest and it is no chest pain. She wants to continue to be as physically active as possible she is not interested in pulmonary rehabilitation.  Of note I did review her CT chest in 2013 and 2017. Today I think ILD slowly progressive IMPRESSION: 1. Stable CT of the chest. 2. Similar appearance of chronic interstitial lung disease compatible with pulmonary fibrosis. This may be secondary to nonspecific interstitial pneumonia or usual interstitial pneumonia (UIP.) 3. Small pulmonary nodules are again noted and appears stable from 06/30/2015. Non-contrast chest CT can be considered at 12 months (from 06/30/2012) if patient is high-risk. This recommendation follows the consensus statement: Guidelines for Management of Incidental Pulmonary Nodules Detected on CT Images: From the Fleischner Society 2017; Radiology 2017; [phone]. 4.   Electronically Signed   By: Kerby Moors M.D.   On: 12/19/2015 14:54    has a past medical history of Arthritis; Backache, unspecified; Cataract; Glaucoma; Lumbar stenosis; Osteoarthrosis and allied disorders; Osteoporosis; Other and unspecified hyperlipidemia;  Peripheral neuropathy ([place]); and Vitamin D deficiency.   reports that she has never smoked. She has never used smokeless tobacco.  Past Surgical History:  Procedure Laterality Date  . ABDOMINAL HYSTERECTOMY    . CATARACT EXTRACTION    . EYE SURGERY Bilateral    cataract  . KNEE ARTHROSCOPY    . LUMBAR LAMINECTOMY      Allergies  Allergen Reactions  . Levaquin [Levaquin Leva-Pak]     Patient cannot remember that she is sensitive to this medication; has never heard of it.  . Mucinex [Guaifenesin Er]     Causes anxiety  . Erythromycin Diarrhea  . Penicillins Rash    Immunization History  Administered Date(s) Administered  . Influenza Split 10/28/2012  . Influenza Whole 09/14/2009  . Influenza, Quadrivalent, Recombinant, Inj, Pf 11/08/2015  . Influenza,inj,Quad PF,36+ Mos 10/24/2014  . Influenza,inj,quad, With Preservative 11/10/2013  . Pneumococcal Conjugate-13 12/31/2012  . Pneumococcal Polysaccharide-23 10/14/1996  . Td 10/15/2002  . Zoster 02/05/2010    Family History  Problem Relation Age of Onset  . Cancer Mother     bone  . Cancer Sister     breast  . Diabetes Brother   . Asthma Daughter   . COPD Son   . Asthma Son   . Cancer Sister     cervical     Current Outpatient Prescriptions:  .  alendronate (FOSAMAX) 70 MG tablet, TAKE 1 TABLET WEEKLY (TAKE WITH 8OZ OF WATER 30 MINUTES BEFORE BREAKFAST), Disp: 12 tablet, Rfl: 0 .  BREO ELLIPTA 100-25 MCG/INH AEPB, USE 1 INHALATION DAILY, Disp: 60 each,  Rfl: 2 .  calcium carbonate (OS-CAL) 600 MG TABS tablet, Take 600 mg by mouth daily with breakfast., Disp: , Rfl:  .  cholecalciferol (VITAMIN D) 1000 UNITS tablet, Take 1,000 Units by mouth daily., Disp: , Rfl:  .  fluticasone (FLONASE) 50 MCG/ACT nasal spray, Place 2 sprays into both nostrils daily., Disp: 16 g, Rfl: 6 .  ibuprofen (ADVIL,MOTRIN) 200 MG tablet, Take 200 mg by mouth every 6 (six) hours as needed., Disp: , Rfl:  .  Multiple Vitamins-Minerals  (CENTRUM SILVER) tablet, Take 1 tablet by mouth every evening., Disp: , Rfl:  .  simvastatin (ZOCOR) 80 MG tablet, TAKE ONE TABLET DAILY AT BEDTIME, Disp: 90 tablet, Rfl: 0 .  trimethoprim (TRIMPEX) 100 MG tablet, , Disp: , Rfl:    Review of Systems     Objective:   Physical Exam  Constitutional: She is oriented to person, place, and time. She appears well-developed and well-nourished. No distress.  HENT:  Head: Normocephalic and atraumatic.  Right Ear: External ear normal.  Left Ear: External ear normal.  Mouth/Throat: Oropharynx is clear and moist. No oropharyngeal exudate.  Eyes: Conjunctivae and EOM are normal. Pupils are equal, round, and reactive to light. Right eye exhibits no discharge. Left eye exhibits no discharge. No scleral icterus.  Neck: Normal range of motion. Neck supple. No JVD present. No tracheal deviation present. No thyromegaly present.  Cardiovascular: Normal rate, regular rhythm, normal heart sounds and intact distal pulses.  Exam reveals no gallop and no friction rub.   No murmur heard. Pulmonary/Chest: Effort normal and breath sounds normal. No respiratory distress. She has no wheezes. She has no rales. She exhibits no tenderness.  Crackles not appreciable  Abdominal: Soft. Bowel sounds are normal. She exhibits no distension and no mass. There is no tenderness. There is no rebound and no guarding.  Musculoskeletal: Normal range of motion. She exhibits no edema or tenderness.  Antalgic gait  Lymphadenopathy:    She has no cervical adenopathy.  Neurological: She is alert and oriented to person, place, and time. She has normal reflexes. No cranial nerve deficit. She exhibits normal muscle tone. Coordination normal.  Skin: Skin is warm and dry. No rash noted. She is not diaphoretic. No erythema. No pallor.  Psychiatric: She has a normal mood and affect. Her behavior is normal. Judgment and thought content normal.  Vitals reviewed.  Vitals:   02/15/16 1127  BP:  116/60  Pulse: 62  SpO2: 98%  Weight: 128 lb 6.4 oz (58.2 kg)  Height: 4' 10.5" (1.486 m)    Estimated body mass index is 26.38 kg/m as calculated from the following:   Height as of this encounter: 4' 10.5" (1.486 m).   Weight as of this encounter: 128 lb 6.4 oz (58.2 kg).      Assessment:       ICD-9-CM ICD-10-CM   1. ILD (interstitial lung disease) ([place]) 515 J84.9 CT Chest Wo Contrast  2. Nodule of right lung 793.11 R91.1        Plan:      ILD (interstitial lung disease) ([place]) - stop breo - Likely IPF - slowly progressive 2008 -> 2013 -> 2017 CT chest - do not advise anti-fibrotic therapy due to side effects and I agree with her view point on this  - wil clinically monitor you - be as active as you can  - refer BI registry study - thanks for participating  - walk test on room air at next visit  Nodule  of right lung  - do CT chest wo contrst in 11 months from 02/15/2016 - dec 2018  Followup 6 months or sooner if needed   - will readdress antifibrotic Rx at [place] if needed  > 50% of this > 25 min visit spent in face to face counseling or coordination of care    She is here again for 0 to meet with them quite can go to another roomDr. Brand Males, M.D., F.C.C.P Pulmonary and Critical Care Medicine Staff Physician [facility] Pager: [phone], If no answer or between  15:00h - 7:00h: call [phone]  02/15/2016 12:01 PM

## 2016-02-16 DIAGNOSIS — H35413 Lattice degeneration of retina, bilateral: Secondary | ICD-10-CM | POA: Diagnosis not present

## 2016-02-16 DIAGNOSIS — Z961 Presence of intraocular lens: Secondary | ICD-10-CM | POA: Diagnosis not present

## 2016-02-16 DIAGNOSIS — H04123 Dry eye syndrome of bilateral lacrimal glands: Secondary | ICD-10-CM | POA: Diagnosis not present

## 2016-02-16 DIAGNOSIS — H3540 Unspecified peripheral retinal degeneration: Secondary | ICD-10-CM | POA: Diagnosis not present

## 2016-04-05 ENCOUNTER — Other Ambulatory Visit (HOSPITAL_COMMUNITY)
Admission: RE | Admit: 2016-04-05 | Discharge: 2016-04-05 | Disposition: A | Discharge status: Home / Self Care | Payer: Medicare Other | Source: Other Acute Inpatient Hospital | Attending: Urology | Admitting: Urology

## 2016-04-05 ENCOUNTER — Ambulatory Visit (INDEPENDENT_AMBULATORY_CARE_PROVIDER_SITE_OTHER): Payer: Medicare Other | Admitting: Urology

## 2016-04-05 DIAGNOSIS — N3946 Mixed incontinence: Secondary | ICD-10-CM

## 2016-04-05 DIAGNOSIS — N3020 Other chronic cystitis without hematuria: Secondary | ICD-10-CM | POA: Diagnosis not present

## 2016-04-05 LAB — URINALYSIS, COMPLETE (UACMP) WITH MICROSCOPIC
Bilirubin Urine: NEGATIVE
GLUCOSE, UA: NEGATIVE mg/dL
Hgb urine dipstick: NEGATIVE
Ketones, ur: NEGATIVE mg/dL
Nitrite: POSITIVE — AB
PH: 5 (ref 5.0–8.0)
Protein, ur: NEGATIVE mg/dL
Specific Gravity, Urine: 1.023 (ref 1.005–1.030)

## 2016-04-08 LAB — URINE CULTURE

## 2016-04-10 ENCOUNTER — Encounter: Payer: Self-pay | Admitting: Family Medicine

## 2016-04-10 ENCOUNTER — Ambulatory Visit (INDEPENDENT_AMBULATORY_CARE_PROVIDER_SITE_OTHER): Payer: Medicare Other | Admitting: Family Medicine

## 2016-04-10 VITALS — BP 132/65 | HR 71 | Temp 98.0°F | Ht 58.5 in | Wt 128.0 lb

## 2016-04-10 DIAGNOSIS — E559 Vitamin D deficiency, unspecified: Secondary | ICD-10-CM

## 2016-04-10 DIAGNOSIS — Z9181 History of falling: Secondary | ICD-10-CM

## 2016-04-10 DIAGNOSIS — E7800 Pure hypercholesterolemia, unspecified: Secondary | ICD-10-CM | POA: Diagnosis not present

## 2016-04-10 DIAGNOSIS — R911 Solitary pulmonary nodule: Secondary | ICD-10-CM

## 2016-04-10 DIAGNOSIS — Z0000 Encounter for general adult medical examination without abnormal findings: Secondary | ICD-10-CM | POA: Diagnosis not present

## 2016-04-10 DIAGNOSIS — D531 Other megaloblastic anemias, not elsewhere classified: Secondary | ICD-10-CM | POA: Diagnosis not present

## 2016-04-10 DIAGNOSIS — N390 Urinary tract infection, site not specified: Secondary | ICD-10-CM

## 2016-04-10 DIAGNOSIS — J849 Interstitial pulmonary disease, unspecified: Secondary | ICD-10-CM

## 2016-04-10 DIAGNOSIS — M48061 Spinal stenosis, lumbar region without neurogenic claudication: Secondary | ICD-10-CM | POA: Diagnosis not present

## 2016-04-10 NOTE — Progress Notes (Signed)
Subjective:    Patient ID: [name], female    DOB: 08-21-1925, 83 y.o.   MRN: [id number]  HPI Pt here for follow up and management of chronic medical problems which includes hyperlipidemia. She is taking medication regularly.The patient has interstitial lung disease. She is followed regularly by Dr. Chase Caller. She also recently saw the urologist because of a urinary tract infection and she is on cephalexin from him with a tapering dose to once a day dose and we'll follow-up with him regarding the urinary tract infections. She has a history of chronic back pain and degenerative disc disease. She has apparently been stable with this. She will get lab work today and will return an FOBT today. The patient is pleasant and alert and responded appropriately to all the questions asked of her. One of her daughters comes with her to the visit today. She's not had any problems with her back other than occasional pain after certain activities. She will go back to the pulmonologist this summer and reminded me that she had a visit then but she has had no trouble with breathing or shortness of breath wheezing or coughing. She denies any chest pain. She denies any trouble with her stomach including nausea vomiting diarrhea blood in the stool or black tarry bowel movements. She has not started taking the antibiotic for her urinary tract infection but will start that soon. She does have occasional burning when she passes her water but not all the time but according to the urologist she did have an ongoing infection which she wants to treat over the next 3 months. She does indicate that she needs a blood type done on her blood work today. She does wear a necklace for home alert and falls around her neck and these are the people that will her blood type.    Patient Active Problem List   Diagnosis Date Noted  . ILD (interstitial lung disease) ([place]) 07/07/2015  . Nodule of right lung 07/07/2015  . Chronic cystitis  01/08/2013  . Vitamin D deficiency 12/31/2012  . Osteoporosis 12/31/2012  . Spinal stenosis of lumbar region 12/31/2012  . Hyperlipidemia 07/16/2012  . Chronic low back pain 07/16/2012  . Vitamin B12 deficient megaloblastic anemia 07/16/2012   Outpatient Encounter Prescriptions as of 04/10/2016  Medication Sig  . alendronate (FOSAMAX) 70 MG tablet TAKE 1 TABLET WEEKLY (TAKE WITH 8OZ OF WATER 30 MINUTES BEFORE BREAKFAST)  . BREO ELLIPTA 100-25 MCG/INH AEPB USE 1 INHALATION DAILY  . calcium carbonate (OS-CAL) 600 MG TABS tablet Take 600 mg by mouth daily with breakfast.  . cephALEXin (KEFLEX) 500 MG capsule Take 1 capsule by mouth 3 (three) times daily. X 1 week - then 1 nightly (dr Jeffie Pollock)  . cholecalciferol (VITAMIN D) 1000 UNITS tablet Take 1,000 Units by mouth daily.  . fluticasone (FLONASE) 50 MCG/ACT nasal spray Place 2 sprays into both nostrils daily.  Marland Kitchen ibuprofen (ADVIL,MOTRIN) 200 MG tablet Take 200 mg by mouth every 6 (six) hours as needed.  . Multiple Vitamins-Minerals (CENTRUM SILVER) tablet Take 1 tablet by mouth every evening.  . simvastatin (ZOCOR) 80 MG tablet TAKE ONE TABLET DAILY AT BEDTIME  . trimethoprim (TRIMPEX) 100 MG tablet    No facility-administered encounter medications on file as of 04/10/2016.       Review of Systems  Constitutional: Negative.   HENT: Negative.   Eyes: Negative.   Respiratory: Negative.   Cardiovascular: Negative.   Gastrointestinal: Negative.   Endocrine: Negative.  Genitourinary: Negative.   Musculoskeletal: Negative.   Skin: Negative.   Allergic/Immunologic: Negative.   Neurological: Negative.   Hematological: Negative.   Psychiatric/Behavioral: Negative.        Objective:   Physical Exam  Constitutional: She is oriented to person, place, and time. She appears well-developed and well-nourished. No distress.  Patient is kind and pleasant and alert. She comes to the visit today with one of her daughters.  HENT:  Head:  Normocephalic and atraumatic.  Right Ear: External ear normal.  Left Ear: External ear normal.  Nose: Nose normal.  Mouth/Throat: Oropharynx is clear and moist. No oropharyngeal exudate.  Eyes: Conjunctivae and EOM are normal. Pupils are equal, round, and reactive to light. Right eye exhibits no discharge. Left eye exhibits no discharge. No scleral icterus.  Neck: Normal range of motion. Neck supple. No thyromegaly present.  No bruits thyromegaly or anterior cervical adenopathy  Cardiovascular: Normal rate, regular rhythm, normal heart sounds and intact distal pulses.   No murmur heard. The heart is 72/m with a regular rate and rhythm  Pulmonary/Chest: Effort normal and breath sounds normal. No respiratory distress. She has no wheezes. She has no rales.  Lungs are clear anteriorly and posteriorly  Abdominal: Soft. Bowel sounds are normal. She exhibits no mass. There is no tenderness. There is no rebound and no guarding.  No liver or spleen enlargement no abdominal tenderness and no masses palpable.  Musculoskeletal: Normal range of motion. She exhibits no edema.  The patient has a somewhat kyphotic posture but seems to ambulate without problems.  Lymphadenopathy:    She has no cervical adenopathy.  Neurological: She is alert and oriented to person, place, and time. She has normal reflexes. No cranial nerve deficit.  Skin: Skin is warm and dry. No rash noted.  Psychiatric: She has a normal mood and affect. Her behavior is normal. Judgment and thought content normal.  Nursing note and vitals reviewed.  BP 132/65 (BP Location: Right Arm)   Pulse 71   Temp 98 F (36.7 C) (Oral)   Ht 4' 10.5" (1.486 m)   Wt 128 lb (58.1 kg)   BMI 26.30 kg/m         Assessment & Plan:  1. Pure hypercholesterolemia -Continue drinking plenty of fluids and stay well hydrated - BMP8+EGFR - CBC with Differential/Platelet - Lipid panel - Hepatic function panel  2. Vitamin D deficiency -Take vitamin  D regularly and did not put yourself at risk for falling - CBC with Differential/Platelet - VITAMIN D 25 Hydroxy (Vit-D Deficiency, Fractures)  3. At risk for falling -Wear the Lifeline at all times - CBC with Differential/Platelet  4. Vitamin B12 deficient megaloblastic anemia -Continue current medication as doing - CBC with Differential/Platelet  5. Interstitial lung disease ([place]) -Follow-up with pulmonology as planned  6. Recurrent urinary tract infection -Follow-up with urology as planned  7. Pulmonary nodule seen on imaging study -Follow-up with pulmonology  8. Spinal stenosis of lumbar region without neurogenic claudication -The patient will follow-up with neurosurgeon if needed. Most recently she has been doing better with her back has had no severe recurrent pain.  9. Health care maintenance - ABO/Rh; Future  Patient Instructions                       Medicare Annual Wellness Visit  [facility] and the medical providers at [facility] strive to bring you the best medical care.  In doing so we not  only want to address your current medical conditions and concerns but also to detect new conditions early and prevent illness, disease and health-related problems.    Medicare offers a yearly Wellness Visit which allows our clinical staff to assess your need for preventative services including immunizations, lifestyle education, counseling to decrease risk of preventable diseases and screening for fall risk and other medical concerns.    This visit is provided free of charge (no copay) for all Medicare recipients. The clinical pharmacists at [facility] have begun to conduct these Wellness Visits which will also include a thorough review of all your medications.    As you primary medical provider recommend that you make an appointment for your Annual Wellness Visit if you have not done so already this year.  You may set up this  appointment before you leave today or you may call back ([phone]) and schedule an appointment.  Please make sure when you call that you mention that you are scheduling your Annual Wellness Visit with the clinical pharmacist so that the appointment may be made for the proper length of time.     Continue current medications. Continue good therapeutic lifestyle changes which include good diet and exercise. Fall precautions discussed with patient. If an FOBT was given today- please return it to our front desk. If you are over 11 years old - you may need Prevnar 21 or the adult Pneumonia vaccine.  **Flu shots are available--- please call and schedule a FLU-CLINIC appointment**  After your visit with [place] today you will receive a survey in the mail or online from [employer] regarding your care with [place]. Please take a moment to fill this out. Your feedback is very important to [place] as you can help [place] better understand your patient needs as well as improve your experience and satisfaction. WE CARE ABOUT YOU!!!    Continue to follow-up with urology Continue to follow-up with pulmonology We will call with lab work results as soon as they become available Continue to be careful and did not put yourself at risk for falling Call doctor if you're still having problems with eyes since she had not responded well to the drops that she gave you and let her know this The Lif[employer] at all times   Arrie Senate MD

## 2016-04-10 NOTE — Patient Instructions (Addendum)
Medicare Annual Wellness Visit  [facility] and the medical providers at [facility] strive to bring you the best medical care.  In doing so we not only want to address your current medical conditions and concerns but also to detect new conditions early and prevent illness, disease and health-related problems.    Medicare offers a yearly Wellness Visit which allows our clinical staff to assess your need for preventative services including immunizations, lifestyle education, counseling to decrease risk of preventable diseases and screening for fall risk and other medical concerns.    This visit is provided free of charge (no copay) for all Medicare recipients. The clinical pharmacists at [facility] have begun to conduct these Wellness Visits which will also include a thorough review of all your medications.    As you primary medical provider recommend that you make an appointment for your Annual Wellness Visit if you have not done so already this year.  You may set up this appointment before you leave today or you may call back ([phone]) and schedule an appointment.  Please make sure when you call that you mention that you are scheduling your Annual Wellness Visit with the clinical pharmacist so that the appointment may be made for the proper length of time.     Continue current medications. Continue good therapeutic lifestyle changes which include good diet and exercise. Fall precautions discussed with patient. If an FOBT was given today- please return it to our front desk. If you are over 18 years old - you may need Prevnar 30 or the adult Pneumonia vaccine.  **Flu shots are available--- please call and schedule a FLU-CLINIC appointment**  After your visit with [place] today you will receive a survey in the mail or online from [employer] regarding your care with [place]. Please take a moment to fill this out. Your feedback is very  important to [place] as you can help [place] better understand your patient needs as well as improve your experience and satisfaction. WE CARE ABOUT YOU!!!    Continue to follow-up with urology Continue to follow-up with pulmonology We will call with lab work results as soon as they become available Continue to be careful and did not put yourself at risk for falling Call doctor if you're still having problems with eyes since she had not responded well to the drops that she gave you and let her know this The Lifeline at all times

## 2016-04-10 NOTE — Addendum Note (Signed)
Addended by: Liliane Bade on: 04/10/2016 11:57 AM   Modules accepted: Orders

## 2016-04-11 LAB — LIPID PANEL
Chol/HDL Ratio: 2.5 ratio units (ref 0.0–4.4)
Cholesterol, Total: 153 mg/dL (ref 100–199)
HDL: 61 mg/dL (ref >39)
LDL Calculated: 70 mg/dL (ref 0–99)
Triglycerides: 111 mg/dL (ref 0–149)
VLDL Cholesterol Cal: 22 mg/dL (ref 5–40)

## 2016-04-11 LAB — CBC WITH DIFFERENTIAL/PLATELET
BASOS: 0 %
Basophils Absolute: 0 10*3/uL (ref 0.0–0.2)
EOS (ABSOLUTE): 0.1 10*3/uL (ref 0.0–0.4)
Eos: 2 %
Hematocrit: 39 % (ref 34.0–46.6)
Hemoglobin: 13 g/dL (ref 11.1–15.9)
IMMATURE GRANULOCYTES: 0 %
Immature Grans (Abs): 0 10*3/uL (ref 0.0–0.1)
LYMPHS ABS: 0.9 10*3/uL (ref 0.7–3.1)
Lymphs: 17 %
MCH: 28.8 pg (ref 26.6–33.0)
MCHC: 33.3 g/dL (ref 31.5–35.7)
MCV: 86 fL (ref 79–97)
Monocytes Absolute: 0.5 10*3/uL (ref 0.1–0.9)
Monocytes: 11 %
NEUTROS PCT: 70 %
Neutrophils Absolute: 3.5 10*3/uL (ref 1.4–7.0)
PLATELETS: 247 10*3/uL (ref 150–379)
RBC: 4.52 x10E6/uL (ref 3.77–5.28)
RDW: 13.9 % (ref 12.3–15.4)
WBC: 5.1 10*3/uL (ref 3.4–10.8)

## 2016-04-11 LAB — HEPATIC FUNCTION PANEL
ALK PHOS: 70 IU/L (ref 39–117)
ALT: 9 IU/L (ref 0–32)
AST: 20 IU/L (ref 0–40)
Albumin: 4 g/dL (ref 3.5–4.7)
Bilirubin Total: 0.3 mg/dL (ref 0.0–1.2)
Bilirubin, Direct: 0.13 mg/dL (ref 0.00–0.40)
Total Protein: 6.4 g/dL (ref 6.0–8.5)

## 2016-04-11 LAB — BMP8+EGFR
BUN/Creatinine Ratio: 18 (ref 12–28)
BUN: 12 mg/dL (ref 8–27)
CO2: 26 mmol/L (ref 18–29)
Calcium: 9.4 mg/dL (ref 8.7–10.3)
Chloride: 102 mmol/L (ref 96–106)
Creatinine, Ser: 0.65 mg/dL (ref 0.57–1.00)
GFR calc Af Amer: 92 mL/min/{1.73_m2} (ref >59)
GFR, EST NON AFRICAN AMERICAN: 80 mL/min/{1.73_m2} (ref >59)
GLUCOSE: 97 mg/dL (ref 65–99)
POTASSIUM: 5.3 mmol/L — AB (ref 3.5–5.2)
SODIUM: 146 mmol/L — AB (ref 134–144)

## 2016-04-11 LAB — ABO/RH: RH TYPE: POSITIVE

## 2016-04-11 LAB — VITAMIN D 25 HYDROXY (VIT D DEFICIENCY, FRACTURES): VIT D 25 HYDROXY: 34.7 ng/mL (ref 30.0–100.0)

## 2016-04-16 ENCOUNTER — Other Ambulatory Visit: Payer: Self-pay | Admitting: *Deleted

## 2016-04-16 DIAGNOSIS — E875 Hyperkalemia: Secondary | ICD-10-CM

## 2016-04-30 ENCOUNTER — Other Ambulatory Visit: Payer: Medicare Other

## 2016-04-30 DIAGNOSIS — E875 Hyperkalemia: Secondary | ICD-10-CM

## 2016-04-30 DIAGNOSIS — Z1211 Encounter for screening for malignant neoplasm of colon: Secondary | ICD-10-CM | POA: Diagnosis not present

## 2016-04-30 LAB — BMP8+EGFR
BUN / CREAT RATIO: 17 (ref 12–28)
BUN: 11 mg/dL (ref 8–27)
CO2: 24 mmol/L (ref 18–29)
Calcium: 9.4 mg/dL (ref 8.7–10.3)
Chloride: 100 mmol/L (ref 96–106)
Creatinine, Ser: 0.66 mg/dL (ref 0.57–1.00)
GFR calc Af Amer: 91 mL/min/{1.73_m2} (ref >59)
GFR calc non Af Amer: 79 mL/min/{1.73_m2} (ref >59)
GLUCOSE: 109 mg/dL — AB (ref 65–99)
POTASSIUM: 4.4 mmol/L (ref 3.5–5.2)
SODIUM: 141 mmol/L (ref 134–144)

## 2016-05-01 DIAGNOSIS — M48061 Spinal stenosis, lumbar region without neurogenic claudication: Secondary | ICD-10-CM | POA: Diagnosis not present

## 2016-05-01 DIAGNOSIS — Z6826 Body mass index (BMI) 26.0-26.9, adult: Secondary | ICD-10-CM | POA: Diagnosis not present

## 2016-05-01 DIAGNOSIS — M150 Primary generalized (osteo)arthritis: Secondary | ICD-10-CM | POA: Diagnosis not present

## 2016-05-01 DIAGNOSIS — J849 Interstitial pulmonary disease, unspecified: Secondary | ICD-10-CM | POA: Diagnosis not present

## 2016-05-01 DIAGNOSIS — E663 Overweight: Secondary | ICD-10-CM | POA: Diagnosis not present

## 2016-05-01 DIAGNOSIS — R768 Other specified abnormal immunological findings in serum: Secondary | ICD-10-CM | POA: Diagnosis not present

## 2016-05-03 LAB — FECAL OCCULT BLOOD, IMMUNOCHEMICAL: FECAL OCCULT BLD: NEGATIVE

## 2016-05-06 ENCOUNTER — Other Ambulatory Visit: Payer: Self-pay | Admitting: Family

## 2016-05-06 ENCOUNTER — Other Ambulatory Visit: Payer: Self-pay | Admitting: Family Medicine

## 2016-05-06 DIAGNOSIS — J309 Allergic rhinitis, unspecified: Secondary | ICD-10-CM

## 2016-05-10 DIAGNOSIS — Z961 Presence of intraocular lens: Secondary | ICD-10-CM | POA: Diagnosis not present

## 2016-05-10 DIAGNOSIS — H04123 Dry eye syndrome of bilateral lacrimal glands: Secondary | ICD-10-CM | POA: Diagnosis not present

## 2016-05-10 DIAGNOSIS — H3540 Unspecified peripheral retinal degeneration: Secondary | ICD-10-CM | POA: Diagnosis not present

## 2016-06-11 DIAGNOSIS — M4807 Spinal stenosis, lumbosacral region: Secondary | ICD-10-CM | POA: Diagnosis not present

## 2016-06-11 DIAGNOSIS — M4722 Other spondylosis with radiculopathy, cervical region: Secondary | ICD-10-CM | POA: Diagnosis not present

## 2016-06-12 ENCOUNTER — Other Ambulatory Visit: Payer: Self-pay | Admitting: Neurosurgery

## 2016-06-12 DIAGNOSIS — M4807 Spinal stenosis, lumbosacral region: Secondary | ICD-10-CM

## 2016-06-21 ENCOUNTER — Ambulatory Visit
Admission: RE | Admit: 2016-06-21 | Discharge: 2016-06-21 | Disposition: A | Discharge status: Home / Self Care | Payer: Medicare Other | Source: Ambulatory Visit | Attending: Neurosurgery | Admitting: Neurosurgery

## 2016-06-21 DIAGNOSIS — M4807 Spinal stenosis, lumbosacral region: Secondary | ICD-10-CM

## 2016-06-21 DIAGNOSIS — M5126 Other intervertebral disc displacement, lumbar region: Secondary | ICD-10-CM | POA: Diagnosis not present

## 2016-06-21 MED ORDER — GADOBENATE DIMEGLUMINE 529 MG/ML IV SOLN
15.0000 mL | Freq: Once | INTRAVENOUS | Status: AC | PRN
  Administered 2016-06-21: 12 mL via INTRAVENOUS

## 2016-06-28 ENCOUNTER — Other Ambulatory Visit (HOSPITAL_COMMUNITY)
Admission: RE | Admit: 2016-06-28 | Discharge: 2016-06-28 | Disposition: A | Discharge status: Home / Self Care | Payer: Medicare Other | Source: Other Acute Inpatient Hospital | Attending: Urology | Admitting: Urology

## 2016-06-28 ENCOUNTER — Ambulatory Visit (INDEPENDENT_AMBULATORY_CARE_PROVIDER_SITE_OTHER): Payer: Medicare Other | Admitting: Urology

## 2016-06-28 DIAGNOSIS — N3020 Other chronic cystitis without hematuria: Secondary | ICD-10-CM | POA: Insufficient documentation

## 2016-06-28 DIAGNOSIS — N3946 Mixed incontinence: Secondary | ICD-10-CM

## 2016-06-29 LAB — URINE CULTURE

## 2016-07-02 ENCOUNTER — Other Ambulatory Visit: Payer: Self-pay | Admitting: Neurosurgery

## 2016-07-02 DIAGNOSIS — M4807 Spinal stenosis, lumbosacral region: Secondary | ICD-10-CM

## 2016-07-08 ENCOUNTER — Ambulatory Visit
Admission: RE | Admit: 2016-07-08 | Discharge: 2016-07-08 | Disposition: A | Discharge status: Home / Self Care | Payer: Medicare Other | Source: Ambulatory Visit | Attending: Neurosurgery | Admitting: Neurosurgery

## 2016-07-08 DIAGNOSIS — M47817 Spondylosis without myelopathy or radiculopathy, lumbosacral region: Secondary | ICD-10-CM | POA: Diagnosis not present

## 2016-07-08 DIAGNOSIS — M4807 Spinal stenosis, lumbosacral region: Secondary | ICD-10-CM

## 2016-07-08 MED ORDER — METHYLPREDNISOLONE ACETATE 40 MG/ML INJ SUSP (RADIOLOG
120.0000 mg | Freq: Once | INTRAMUSCULAR | Status: AC
  Administered 2016-07-08: 120 mg via EPIDURAL

## 2016-07-08 MED ORDER — IOPAMIDOL (ISOVUE-M 200) INJECTION 41%
1.0000 mL | Freq: Once | INTRAMUSCULAR | Status: AC
  Administered 2016-07-08: 1 mL via EPIDURAL

## 2016-07-08 NOTE — Discharge Instructions (Signed)

## 2016-07-23 ENCOUNTER — Ambulatory Visit (INDEPENDENT_AMBULATORY_CARE_PROVIDER_SITE_OTHER): Payer: Medicare Other | Admitting: *Deleted

## 2016-07-23 VITALS — BP 125/61 | HR 67 | Ht <= 58 in | Wt 120.0 lb

## 2016-07-23 DIAGNOSIS — Z0000 Encounter for general adult medical examination without abnormal findings: Secondary | ICD-10-CM

## 2016-07-23 NOTE — Patient Instructions (Addendum)
  [name] ,  Thank you for taking time to come for your Medicare Wellness Visit. I appreciate your ongoing commitment to your health goals. Please review the following plan we discussed and let me know if I can assist you in the future.   These are the goals we discussed: Goals    . Exercise 150 minutes per week (moderate activity)          Continue to stay active for at least 30 minutes daily        This is a list of the screening recommended for you and due dates:  Health Maintenance  Topic Date Due  . Flu Shot  08/14/2016  . Tetanus Vaccine  03/14/2020  . DEXA scan (bone density measurement)  Completed  . Pneumonia vaccines  Completed

## 2016-07-23 NOTE — Progress Notes (Signed)
Subjective:   [name] is a 83 y.o. female who presents for a subsequent Medicare Annual Wellness Visit. She is accompanied by her daughter.  [name] is very active around her home and yard. She has a garden and cans vegetables during the summer. During the winter she makes blankets and quilts. She handwashes and hangs her own laundry and cleans her house. She understands the importance of staying physically and mentally active. She enjoys reading, puzzle books, and putting together puzzles.   Review of Systems     Reports that health is about the same as last year.   Musculoskeletal: Chronic back pain. Takes ibuprofen 200mg  once daily. Saw Dr Christella Noa and had a steroid injection last month. This helps relieve some of the pain. She doesn't like to take medication. She also has a trigger finger and arthritis in her hands/fingers. She states that as long as she stays moving and uses them they aren't too stiff.   Cardiac Risk Factors include: advanced age (>81men, >81 women);dyslipidemia  Other systems negative.    Objective:    Today's Vitals   07/23/16 1546 07/23/16 1601  BP: 125/61   Pulse: 67   Weight: 120 lb (54.4 kg)   Height: 4\' 10"  (1.473 m)   PainSc:  7    Body mass index is 25.08 kg/m.  Chronic back pain. Rates pain at a 9 in the morning before getting up and moving around. Pain improves to an average of 7. She tolerates it at that level and verbalizes that she doesn't want to take any more medication for it.  Current Medications (verified) Outpatient Encounter Prescriptions as of 07/23/2016  Medication Sig  . alendronate (FOSAMAX) 70 MG tablet TAKE 1 TABLET WEEKLY (TAKE WITH 8OZ OF WATER 30 MINUTES BEFORE BREAKFAST)  . BREO ELLIPTA 100-25 MCG/INH AEPB USE 1 INHALATION DAILY  . calcium carbonate (OS-CAL) 600 MG TABS tablet Take 600 mg by mouth daily with breakfast.  . cephALEXin (KEFLEX) 500 MG capsule Take 1 capsule by mouth 3 (three) times daily. X 1 week - then 1  nightly (dr Jeffie Pollock)  . cholecalciferol (VITAMIN D) 1000 UNITS tablet Take 1,000 Units by mouth daily.  . fluticasone (FLONASE) 50 MCG/ACT nasal spray USE 2 SPRAYS IN EACH NOSTRIL DAILY  . ibuprofen (ADVIL,MOTRIN) 200 MG tablet Take 200 mg by mouth every 6 (six) hours as needed.  . Multiple Vitamins-Minerals (CENTRUM SILVER) tablet Take 1 tablet by mouth every evening.  . ranitidine (ZANTAC) 150 MG tablet Take 150 mg by mouth 2 (two) times daily.  . simvastatin (ZOCOR) 80 MG tablet TAKE ONE TABLET DAILY AT BEDTIME  . [DISCONTINUED] trimethoprim (TRIMPEX) 100 MG tablet    No facility-administered encounter medications on file as of 07/23/2016.     Allergies (verified) Levaquin [levaquin leva-pak]; Mucinex [guaifenesin er]; Erythromycin; and Penicillins   History: Past Medical History:  Diagnosis Date  . Arthritis   . Backache, unspecified   . Cataract   . Glaucoma   . Lumbar stenosis   . Osteoarthrosis and allied disorders   . Osteoporosis   . Other and unspecified hyperlipidemia   . Peripheral neuropathy   . Vitamin D deficiency    Past Surgical History:  Procedure Laterality Date  . ABDOMINAL HYSTERECTOMY    . CATARACT EXTRACTION    . EYE SURGERY Bilateral    cataract  . KNEE ARTHROSCOPY    . LUMBAR LAMINECTOMY     Family History  Problem Relation Age of Onset  .  Cancer Mother        bone  . Cancer Sister        breast  . Diabetes Brother   . Asthma Daughter   . COPD Son   . Asthma Son    Social History   Occupational History  . Not on file.   Social History Main Topics  . Smoking status: Never Smoker  . Smokeless tobacco: Never Used  . Alcohol use No  . Drug use: No  . Sexual activity: No    Tobacco Counseling No tobacco use  Activities of Daily Living In your present state of health, do you have any difficulty performing the following activities: 07/23/2016  Hearing? Y  Vision? N  Difficulty concentrating or making decisions? N  Walking or climbing  stairs? N  Dressing or bathing? N  Doing errands, shopping? N  Preparing Food and eating ? N  Using the Toilet? N  In the past six months, have you accidently leaked urine? N  Do you have problems with loss of bowel control? N  Managing your Medications? N  Managing your Finances? N  Housekeeping or managing your Housekeeping? N  Some recent data might be hidden    Immunizations and Health Maintenance Immunization History  Administered Date(s) Administered  . Influenza Split 10/28/2012  . Influenza Whole 09/14/2009  . Influenza, Quadrivalent, Recombinant, Inj, Pf 11/08/2015  . Influenza,inj,Quad PF,36+ Mos 10/24/2014  . Influenza,inj,quad, With Preservative 11/10/2013  . Pneumococcal Conjugate-13 12/31/2012  . Pneumococcal Polysaccharide-23 10/14/1996  . Td 10/15/2002  . Zoster 02/05/2010   There are no preventive care reminders to display for this patient.  Patient Care Team: Chipper Herb, MD as PCP - General (Family Medicine) Ashok Pall, MD as Consulting Physician (Neurosurgery) Daryll Brod, MD as Consulting Physician (Orthopedic Surgery) Brand Males, MD as Consulting Physician (Pulmonary Disease) Irine Seal, MD as Attending Physician (Urology) Melina Schools, OD (Optometry)  Patient had one fall and an ER visit afterwards. No fractures. No hospitalizations or surgeries this past year.     Assessment:   This is a routine wellness examination for [name].   Hearing/Vision screen No hearing or vision deficits noted during visit. Eye exam up to date.  Dietary issues and exercise activities discussed: Current Exercise Habits: Home exercise routine (patient stays busy around her house and garen. She cans food, cooks, and washes clothes by hand. Walks up and down stairs several times a day.), Intensity: Moderate, Exercise limited by: orthopedic condition(s) (chronic back pain)  Diet: Eats 3 small meals a day and drinks Nut[employer] meal replacement as a supplement.  She doesn't like Boost.   Goals    . Exercise 150 minutes per week (moderate activity)          Continue to stay active for at least 30 minutes daily       Depression Screen PHQ 2/9 Scores 07/23/2016 04/10/2016 01/10/2016 11/08/2015 06/29/2015 02/24/2015 10/24/2014  PHQ - 2 Score 0 0 0 0 0 1 0    Fall Risk Fall Risk  07/23/2016 04/10/2016 01/10/2016 11/08/2015 06/29/2015  Falls in the past year? Yes Yes Yes No No  Number falls in past yr: 1 1 1  - -  Injury with Fall? No No Yes - -  Risk for fall due to : Impaired balance/gait - - - -  Follow up Falls prevention discussed - - - -  Patient is very cautious and moves carefully and purposefully to avoid falls. She is aware of the risks stairs  pose and walks up stairs on her hands and feet and balances against the wall when walking down. She does not have rails and states that her house is old with narrow steps.   Cognitive Function: MMSE - Mini Mental State Exam 07/23/2016 07/23/2016  Orientation to time 5 5  Orientation to Place 5 5  Registration 3 3  Attention/ Calculation 5 5  Recall 1 1  Language- name 2 objects 2 2  Language- repeat 0 0  Language- follow 3 step command 3 3  Language- read & follow direction 1 1  Write a sentence 1 1  Copy design 0 -  Total score 26 -    Patient's working memory during the visit was very good and her long term memory is excellent. I don't feel that the results of the test are an accurate reflection of her cognitive function.     Screening Tests Health Maintenance  Topic Date Due  . INFLUENZA VACCINE  08/14/2016  . TETANUS/TDAP  03/14/2020  . DEXA SCAN  Completed  . PNA vac Low Risk Adult  Completed      Plan:  -Continue to stay active. -Strongly recommended better pain control. Patient is reluctant to take more medication but says she will try taking Advil twice daily instead of only in the morning. Encouraged her to talk with Dr Christella Noa about her options.  -Discussed fall prevention.  Move carefully to avoid falls. -Bring a copy of Advance Directives to our office -Keep f/u appointments  I have personally reviewed and noted the following in the patient's chart:   . Medical and social history . Use of alcohol, tobacco or illicit drugs  . Current medications and supplements . Functional ability and status . Nutritional status . Physical activity . Advanced directives . List of other physicians . Hospitalizations, surgeries, and ER visits in previous 12 months . Vitals . Screenings to include cognitive, depression, and falls . Referrals and appointments  In addition, I have reviewed and discussed with patient certain preventive protocols, quality metrics, and best practice recommendations. A written personalized care plan for preventive services as well as general preventive health recommendations were provided to patient.     Chong Sicilian, RN   07/23/2016   I have reviewed and agree with the above AWV documentation.   [place], FNP'

## 2016-08-08 NOTE — Progress Notes (Addendum)
IPF PRO Registry Purpose: To collect data and biological samples that will support future research studies.  Registry will describe current approaches to diagnosis and treatment of IPF, analyze participant characteristics to describe the natural history of the disease, assess quality of life, describe participants interactions with the health care system, describe IPF treatment practices across multiple institutions, and utilize biological samples linked to well characterized IPF participants to identify disease biomarkers.   Clinical Research Coordinator / Research RN note : This visit for Subject [name] with DOB: April 18, 1925 on 08/09/2016 for the above protocol is Visit/Encounter enrollment and is for purpose of research . The consent for this encounter is under Protocol Version Amendment 2 31/Jan/2017and is currently IRB approved. Subject expressed continued interest and consent in continuing as a study subject. Subject confirmed that there was no change in contact information (e.g. address, telephone, email). Subject thanked for participation in research and contribution to science.   In this visit 08/09/2016 the subject will be evaluated by investigator named Dr. Chase Caller. This research coordinator has verified that the investigator is uptodate with his training logs.  1. Because this visit is a key visit of enrollment this visit is under direct supervision of the PI Dr.Ramaswamy . This PI is available for this visit.  PRO Questionnaires and laboratory assessments completed per the above mentioned protocol. Refer to subjects paper source binder for further documentation.     Signed by  August Luz  Clinical Research Coordinator / Nurse [facility]  [place], [name] 5:03 PM 08/08/2016

## 2016-08-09 ENCOUNTER — Encounter: Payer: Self-pay | Admitting: Internal Medicine

## 2016-08-09 ENCOUNTER — Ambulatory Visit (INDEPENDENT_AMBULATORY_CARE_PROVIDER_SITE_OTHER): Payer: Medicare Other | Admitting: Internal Medicine

## 2016-08-09 VITALS — BP 112/62 | HR 65 | Ht <= 58 in | Wt 119.4 lb

## 2016-08-09 DIAGNOSIS — J84112 Idiopathic pulmonary fibrosis: Secondary | ICD-10-CM | POA: Insufficient documentation

## 2016-08-09 DIAGNOSIS — R911 Solitary pulmonary nodule: Secondary | ICD-10-CM

## 2016-08-09 NOTE — Progress Notes (Signed)
Subjective:     Patient ID: [name], female   DOB: 08-Sep-1925, 83 y.o.   MRN: [id number]  HPI   PCP Redge Gainer, MD   IOV 07/07/2015  Chief Complaint  Patient presents with  . Advice Only    Referred by Dr. Laurance Flatten for pulmonary nodule.       83 year old female accompanied by her daughter [name]. Referred for pulmonary nodule. According to the daughter and the patient will Sr. he is not fully clear with a long-standing history of pulmonary nodule that has been followed locally for many years review of the chart shows that she's had CT scan of the chest for many years. The CT scans of the chest. All the way back to 2004/2006 and described pulmonary fibrosis and nodule. I only have the images from 2014 that shows bilateral upper lobe pulmonary fibrosis with some involvement in the lower lobes. Non-UIP pattern. Most recently she had CT scan of the chest 06/30/2015. According to the daughter ration had some infectious symptoms at this time but again the CT was being done to evaluate for follow-up nodule and a new nodule was discovered which is described to be in the right upper lobe 6 mm and therefore has been referred here. Currently patient is feeling well baseline which is just mild intermittent occasional cough and shortness of breath with wheezing for which she is on maintenance inhaler possibly Brio which helps. Overall quality of life is fine and she does not wish for aggressive intervention.  I personally visualized the CT chest below. In terms of pulmonary fibrosis she is unaware of the diagnosis. Inour chart she's not had any autoimmune workup. She does live in an extremely old house over 3 years old. They think there might be some mold in it. She has chronic arthralgia but no diagnosed autoimmune disease   CT chest 06/30/15 EXAM: CT CHEST WITHOUT CONTRAST  TECHNIQUE: Multidetector CT imaging of the chest was performed following the standard protocol without IV  contrast.  COMPARISON: Chest radiograph 08/29/2015  FINDINGS: Mediastinum/Lymph Nodes: There are multiple borderline enlarged mediastinal lymph nodes, with reactive appearance. The heart is mildly enlarged. There is no pericardial effusion. Calcified atherosclerotic disease of the coronary arteries is seen.  Lungs/Pleura: There is no evidence of pneumothorax, pleural effusion or acute airspace consolidation. There are subpleural with upper lobe predominance fibrotic changes of the lungs bilaterally with coarsening of the interstitial markings, mild honeycombing and traction bronchiectasis. An area of scarring versus pulmonary nodule is seen in the subpleural right upper lobe and measures 6.4 mm (image 40/148, sequence 3). Mild pleural thickening is noted anteriorly at the level of the right middle lobe/ lingula.  Upper abdomen: No acute findings.  Musculoskeletal: No chest wall mass or suspicious bone lesions identified.  IMPRESSION: Upper lobe predominant chronic interstitial lung disease.  Multiple borderline enlarged mediastinal lymph nodes.  Differential diagnosis includes ankylosing spondylitis, sarcoidosis, silicosis, eosinophilic granuloma or prior granulomatous infection such as tuberculosis.  6.4 mm right upper lobe pulmonary nodule versus area of prominent scarring. Initial follow-up with CT at 6-12 months is recommended to confirm persistence. If persistent, repeat CT is recommended every 2 years until 5 years of stability has been established. This recommendation follows the consensus statement: Guidelines for Management of Incidental Pulmonary Nodules Detected on CT Images:From the Fleischner Society 2017; published online before print (10.1148/radiol.[id number]).   Electronically Signed  By: Fidela Salisbury M.D.  On: 06/30/2015 17:20  OV 02/15/2016  Chief Complaint  Patient presents with  .  Follow-up    Pt here after CT chest. Pt  states her breathing is unchanged since last OV. Pt denies cough and CP/tightness.    Follow-up 6 mm right upper lobe nodule  In follow-up interstitial lung disease not otherwise specified base in June 2017 CT chest  She had follow-up CT chest recommended below that at personally visualized with her and her daughter [name]. According to me the right upper lobe nodule is stable and I agree with the report. The interstitial lung disease pattern appears to be one of possible UIP. She has an elevated rheumatoid factor but negative CCP. Did tell me that they saw Dr. Trudie Reed rheumatologist and has been reassured that there is no systemic evidence of rheumatoid arthritis. I do not have the chart with me. Overall she's stable but does get dyspneic doing gardening and then relieved by rest and it is no chest pain. She wants to continue to be as physically active as possible she is not interested in pulmonary rehabilitation.  Of note I did review her CT chest in 2013 and 2017. Today I think ILD slowly progressive IMPRESSION: 1. Stable CT of the chest. 2. Similar appearance of chronic interstitial lung disease compatible with pulmonary fibrosis. This may be secondary to nonspecific interstitial pneumonia or usual interstitial pneumonia (UIP.) 3. Small pulmonary nodules are again noted and appears stable from 06/30/2015. Non-contrast chest CT can be considered at 12 months (from 06/30/2012) if patient is high-risk. This recommendation follows the consensus statement: Guidelines for Management of Incidental Pulmonary Nodules Detected on CT Images: From the Fleischner Society 2017; Radiology 2017; [phone]. 4.   Electronically Signed   By: Kerby Moors M.D.   On: 12/19/2015 14:54  OV 08/09/2016  Chief Complaint  Patient presents with  . Follow-up    Pt states her breathing is unchanged sicne last OV. Pt states she has a dry cough. Pt denies CP/tightness and f/c/s.    Follow-up idiopathic  pulmonary fibrosis diagnosis based based on age, possible UIP on CT scan and negative rheumatology evaluation. Diagnosis given 02/15/2016  Follow-up lung nodule  Overall she's doing stable. Today in the office she walked coronary 5 feet 3 laps on room air: She was unable to do prolapse and then stopped because of hip pain and knee pain is chronic issues. She continues to live alone and her daughter [name] is with her today. She is functional in the house able to gardening but then stopped because of dyspnea. She does have some wheeze for which she takes Brio although there is no emphysema on the CT scan.     has a past medical history of Arthritis; Backache, unspecified; Cataract; Glaucoma; Lumbar stenosis; Osteoarthrosis and allied disorders; Osteoporosis; Other and unspecified hyperlipidemia; Peripheral neuropathy; and Vitamin D deficiency.   reports that she has never smoked. She has never used smokeless tobacco.  Past Surgical History:  Procedure Laterality Date  . ABDOMINAL HYSTERECTOMY    . CATARACT EXTRACTION    . EYE SURGERY Bilateral    cataract  . KNEE ARTHROSCOPY    . LUMBAR LAMINECTOMY      Allergies  Allergen Reactions  . Levaquin [Levaquin Leva-Pak]     Patient cannot remember that she is sensitive to this medication; has never heard of it.  . Mucinex [Guaifenesin Er]     Causes anxiety  . Erythromycin Diarrhea  . Penicillins Rash    Immunization History  Administered Date(s) Administered  . Influenza Split 10/28/2012  . Influenza Whole 09/14/2009  .  Influenza, Quadrivalent, Recombinant, Inj, Pf 11/08/2015  . Influenza,inj,Quad PF,36+ Mos 10/24/2014  . Influenza,inj,quad, With Preservative 11/10/2013  . Pneumococcal Conjugate-13 12/31/2012  . Pneumococcal Polysaccharide-23 10/14/1996  . Td 10/15/2002  . Zoster 02/05/2010    Family History  Problem Relation Age of Onset  . Cancer Mother        bone  . Cancer Sister        breast  . Diabetes Brother    . Asthma Daughter   . COPD Son   . Asthma Son      Current Outpatient Prescriptions:  .  alendronate (FOSAMAX) 70 MG tablet, TAKE 1 TABLET WEEKLY (TAKE WITH 8OZ OF WATER 30 MINUTES BEFORE BREAKFAST), Disp: 12 tablet, Rfl: 2 .  BREO ELLIPTA 100-25 MCG/INH AEPB, USE 1 INHALATION DAILY, Disp: 60 each, Rfl: 2 .  calcium carbonate (OS-CAL) 600 MG TABS tablet, Take 600 mg by mouth daily with breakfast., Disp: , Rfl:  .  cephALEXin (KEFLEX) 500 MG capsule, Take 1 capsule by mouth 3 (three) times daily. X 1 week - then 1 nightly (dr Jeffie Pollock), Disp: , Rfl:  .  cholecalciferol (VITAMIN D) 1000 UNITS tablet, Take 1,000 Units by mouth daily., Disp: , Rfl:  .  fluticasone (FLONASE) 50 MCG/ACT nasal spray, USE 2 SPRAYS IN EACH NOSTRIL DAILY, Disp: 16 g, Rfl: 4 .  ibuprofen (ADVIL,MOTRIN) 200 MG tablet, Take 200 mg by mouth every 6 (six) hours as needed., Disp: , Rfl:  .  Multiple Vitamins-Minerals (CENTRUM SILVER) tablet, Take 1 tablet by mouth every evening., Disp: , Rfl:  .  ranitidine (ZANTAC) 150 MG tablet, Take 150 mg by mouth 2 (two) times daily., Disp: , Rfl:  .  simvastatin (ZOCOR) 80 MG tablet, TAKE ONE TABLET DAILY AT BEDTIME, Disp: 90 tablet, Rfl: 0    Review of Systems     Objective:   Physical Exam  Constitutional: She is oriented to person, place, and time. She appears well-developed and well-nourished. No distress.  Frail slow gait  HENT:  Head: Normocephalic and atraumatic.  Right Ear: External ear normal.  Left Ear: External ear normal.  Mouth/Throat: Oropharynx is clear and moist. No oropharyngeal exudate.  Eyes: Pupils are equal, round, and reactive to light. Conjunctivae and EOM are normal. Right eye exhibits no discharge. Left eye exhibits no discharge. No scleral icterus.  Neck: Normal range of motion. Neck supple. No JVD present. No tracheal deviation present. No thyromegaly present.  Cardiovascular: Normal rate, regular rhythm, normal heart sounds and intact distal pulses.   Exam reveals no gallop and no friction rub.   No murmur heard. Pulmonary/Chest: Effort normal. No respiratory distress. She has no wheezes. She has rales. She exhibits no tenderness.  Abdominal: Soft. Bowel sounds are normal. She exhibits no distension and no mass. There is no tenderness. There is no rebound and no guarding.  Musculoskeletal: Normal range of motion. She exhibits no edema or tenderness.  Lymphadenopathy:    She has no cervical adenopathy.  Neurological: She is alert and oriented to person, place, and time. She has normal reflexes. No cranial nerve deficit. She exhibits normal muscle tone. Coordination normal.  Capacity + Slow    Skin: Skin is warm and dry. No rash noted. She is not diaphoretic. No erythema. No pallor.  Psychiatric: She has a normal mood and affect. Her behavior is normal. Judgment and thought content normal.  Vitals reviewed.  Vitals:   08/09/16 1052  BP: 112/62  Pulse: 65  SpO2: 98%  Weight: 119  lb 6.4 oz (54.2 kg)  Height: 4\' 10"  (1.473 m)    Estimated body mass index is 24.95 kg/m as calculated from the following:   Height as of this encounter: 4\' 10"  (1.473 m).   Weight as of this encounter: 119 lb 6.4 oz (54.2 kg).     Assessment:       ICD-10-CM   1. IPF (idiopathic pulmonary fibrosis) ([place]) J84.112   2. Nodule of right lung R91.1        Plan:      1. IPF (idiopathic pulmonary fibrosis) (HCC) Diagnosis IPF given 02/15/16 Very Slowly progressive variety but Stable clinically since last visit  Plan Keep as active as you can Continue supportive care Consent for BI-IPF registry trial Flu shot in fall In dec 2018 do Pre-bd spiro and dlco only. No lung volume or bd response. No post-bd spiro   2. Nodule of right lung Repeat CT chest wo contrast dec 2018  Followup Dec 2018   Dr. Brand Males, M.D., F.C.C.P Pulmonary and Critical Care Medicine Staff Physician [facility] Pager:  [phone], If no answer or between  15:00h - 7:00h: call [phone]  08/09/2016 11:26 AM

## 2016-08-09 NOTE — Addendum Note (Signed)
Addended by: Collier Salina on: 08/09/2016 11:50 AM   Modules accepted: Orders

## 2016-08-09 NOTE — Addendum Note (Signed)
Addended by: Collier Salina on: 08/09/2016 11:32 AM   Modules accepted: Orders

## 2016-08-09 NOTE — Patient Instructions (Addendum)
ICD-10-CM   1. IPF (idiopathic pulmonary fibrosis) ([place]) J84.112   2. Nodule of right lung R91.1    1. IPF (idiopathic pulmonary fibrosis) (HCC) Diagnosis IPF given 02/15/16 Very Slowly progressive variety but Stable clinically since last visit  Plan Keep as active as you can Continue supportive care Consent for BI-IPF registry trial Flu shot in fall In dec 2018 do Pre-bd spiro and dlco only. No lung volume or bd response. No post-bd spiro   2. Nodule of right lung Repeat CT chest wo contrast dec 2018  Followup Dec 2018

## 2016-08-22 ENCOUNTER — Ambulatory Visit (INDEPENDENT_AMBULATORY_CARE_PROVIDER_SITE_OTHER): Payer: Medicare Other | Admitting: Family Medicine

## 2016-08-22 ENCOUNTER — Encounter: Payer: Self-pay | Admitting: Family Medicine

## 2016-08-22 ENCOUNTER — Telehealth: Payer: Self-pay | Admitting: Internal Medicine

## 2016-08-22 VITALS — BP 145/66 | HR 66 | Temp 97.1°F | Ht <= 58 in | Wt 117.0 lb

## 2016-08-22 DIAGNOSIS — D531 Other megaloblastic anemias, not elsewhere classified: Secondary | ICD-10-CM | POA: Diagnosis not present

## 2016-08-22 DIAGNOSIS — J84112 Idiopathic pulmonary fibrosis: Secondary | ICD-10-CM

## 2016-08-22 DIAGNOSIS — J849 Interstitial pulmonary disease, unspecified: Secondary | ICD-10-CM

## 2016-08-22 DIAGNOSIS — G8929 Other chronic pain: Secondary | ICD-10-CM | POA: Diagnosis not present

## 2016-08-22 DIAGNOSIS — E7800 Pure hypercholesterolemia, unspecified: Secondary | ICD-10-CM

## 2016-08-22 DIAGNOSIS — M5440 Lumbago with sciatica, unspecified side: Secondary | ICD-10-CM

## 2016-08-22 DIAGNOSIS — M48061 Spinal stenosis, lumbar region without neurogenic claudication: Secondary | ICD-10-CM

## 2016-08-22 DIAGNOSIS — E559 Vitamin D deficiency, unspecified: Secondary | ICD-10-CM | POA: Diagnosis not present

## 2016-08-22 NOTE — Telephone Encounter (Signed)
pts daughter stated that they would like the CT scan ordered at [facility] since this is closer to them.  Will forward to the PCC's to schedule this for the pt.  Thanks

## 2016-08-22 NOTE — Progress Notes (Signed)
Subjective:    Patient ID: [name], female    DOB: 1925/09/03, 83 y.o.   MRN: [id number]  HPI Pt here for follow up and management of chronic medical problems which includes hyperlipidemia. She is taking medication regularly.The patient has interstitial lung disease and recently saw the pulmonologist and a CT scan has been scheduled for follow-up of this. She also has a 6.4 mm right upper lobe nodule. The pulmonologist thinks that this idiopathic lung disease is slowly progressive. The patient's main complaint today is fatigue. Her blood pressure initially was 145/66. Her weight is down a couple pounds from previous check. The last CT scan from December 2017 was reviewed. A noncontrast chest CT will most likely be repeated in 10 months from the date of this CT scan which would be an September 2018. The patient does have some shortness of breath with activity but says this is about the same as usual. She reminded me today that this repeat CT scan will be done in September. She denies any chest pain. She does use her Brio inhaler regularly and this seems to help her breathing. She denies any trouble with nausea vomiting diarrhea blood in the stool or black tarry bowel movements. She is passing her water well but is taking cephalexin 500 mg 1 daily for prevention of recurrent urinary tract infections. She is recently seen the neurosurgeon and had an injection in her back and her back is feeling better.   Patient Active Problem List   Diagnosis Date Noted  . IPF (idiopathic pulmonary fibrosis) ([place]) 08/09/2016  . Interstitial lung disease ([place]) 07/07/2015  . Nodule of right lung 07/07/2015  . Chronic cystitis 01/08/2013  . Vitamin D deficiency 12/31/2012  . Osteoporosis 12/31/2012  . Spinal stenosis of lumbar region 12/31/2012  . Hyperlipidemia 07/16/2012  . Chronic low back pain 07/16/2012  . Vitamin B12 deficient megaloblastic anemia 07/16/2012   Outpatient Encounter Prescriptions as of  08/22/2016  Medication Sig  . alendronate (FOSAMAX) 70 MG tablet TAKE 1 TABLET WEEKLY (TAKE WITH 8OZ OF WATER 30 MINUTES BEFORE BREAKFAST)  . BREO ELLIPTA 100-25 MCG/INH AEPB USE 1 INHALATION DAILY  . calcium carbonate (OS-CAL) 600 MG TABS tablet Take 600 mg by mouth daily with breakfast.  . cephALEXin (KEFLEX) 500 MG capsule Take 1 capsule by mouth 3 (three) times daily. X 1 week - then 1 nightly (dr Jeffie Pollock)  . cholecalciferol (VITAMIN D) 1000 UNITS tablet Take 1,000 Units by mouth daily.  . fluticasone (FLONASE) 50 MCG/ACT nasal spray USE 2 SPRAYS IN EACH NOSTRIL DAILY  . ibuprofen (ADVIL,MOTRIN) 200 MG tablet Take 200 mg by mouth every 6 (six) hours as needed.  . Multiple Vitamins-Minerals (CENTRUM SILVER) tablet Take 1 tablet by mouth every evening.  . ranitidine (ZANTAC) 150 MG tablet Take 150 mg by mouth 2 (two) times daily.  . simvastatin (ZOCOR) 80 MG tablet TAKE ONE TABLET DAILY AT BEDTIME   No facility-administered encounter medications on file as of 08/22/2016.       Review of Systems  Constitutional: Positive for fatigue.  HENT: Negative.   Eyes: Negative.   Respiratory: Negative.   Cardiovascular: Negative.   Gastrointestinal: Negative.   Endocrine: Negative.   Genitourinary: Negative.   Musculoskeletal: Negative.   Skin: Negative.   Allergic/Immunologic: Negative.   Neurological: Negative.   Hematological: Negative.   Psychiatric/Behavioral: Negative.        Objective:   Physical Exam  Constitutional: She is oriented to person, place, and time. She  appears well-nourished. No distress.  Elderly sweet and kyphotic lady who comes to the visit today with her daughter.  HENT:  Head: Normocephalic and atraumatic.  Right Ear: External ear normal.  Left Ear: External ear normal.  Nose: Nose normal.  Mouth/Throat: Oropharynx is clear and moist.  Eyes: Pupils are equal, round, and reactive to light. Conjunctivae and EOM are normal. Right eye exhibits no discharge. Left eye  exhibits no discharge. No scleral icterus.  Neck: Normal range of motion. Neck supple. No thyromegaly present.  No thyromegaly or bruits.  Cardiovascular: Normal rate, regular rhythm, normal heart sounds and intact distal pulses.   No murmur heard. The heart is regular at 72/m  Pulmonary/Chest: Effort normal and breath sounds normal. No respiratory distress. She has no wheezes. She has no rales.  The lungs were clear anteriorly and posteriorly with a dry cough and no signs of any congestion.  Abdominal: Soft. Bowel sounds are normal. She exhibits no mass. There is no tenderness. There is no rebound and no guarding.  No abdominal or suprapubic tenderness or organ enlargement  Musculoskeletal: Normal range of motion. She exhibits no edema.  Movement is slow and somewhat hesitant due to her kyphosis and chronic back pain which is currently better following an injection by the neurosurgeon  Lymphadenopathy:    She has no cervical adenopathy.  Neurological: She is alert and oriented to person, place, and time. She has normal reflexes. No cranial nerve deficit.  Skin: Skin is warm and dry. No rash noted.  Psychiatric: She has a normal mood and affect. Her behavior is normal. Judgment and thought content normal.  Nursing note and vitals reviewed.   BP (!) 145/66 (BP Location: Left Arm)   Pulse 66   Temp (!) 97.1 F (36.2 C) (Oral)   Ht _0  (1.473 m)   Wt 117 lb (53.1 kg)   BMI 24.45 kg/m        Assessment & Plan:  1. Pure hypercholesterolemia -Continue with current treatment pending results of lab work continue with aggressive therapeutic lifestyle changes as possible - CBC with Differential/Platelet - BMP8+EGFR - Hepatic function panel - Lipid panel  2. Vitamin D deficiency -Continue current treatment pending results of lab work - CBC with Differential/Platelet - VITAMIN D 25 Hydroxy (Vit-D Deficiency, Fractures)  3. Vitamin B12 deficient megaloblastic anemia -Continue with  B12 injections - CBC with Differential/Platelet - Vitamin B12  4. Interstitial lung disease ([place]) -Continue follow-up with pulmonologist  5. Idiopathic pulmonary fibrosis ([place]) Continue follow-up with pulmonologist  6. Spinal stenosis of lumbar region without neurogenic claudication -Follow-up with neurosurgery as planne-follow-up with neurosurgery as planned and make every effort not to put yourself at risk for falling   7. Chronic bilateral low back pain with sciatica, sciatica laterality unspecified -Follow-up with neurosurgery as planned   Patient Instructions                       Medicare Annual Wellness Visit  [facility] and the medical providers at [facility] strive to bring you the best medical care.  In doing so we not only want to address your current medical conditions and concerns but also to detect new conditions early and prevent illness, disease and health-related problems.    Medicare offers a yearly Wellness Visit which allows our clinical staff to assess your need for preventative services including immunizations, lifestyle education, counseling to decrease risk of preventable diseases and screening for fall risk  and other medical concerns.    This visit is provided free of charge (no copay) for all Medicare recipients. The clinical pharmacists at [facility] have begun to conduct these Wellness Visits which will also include a thorough review of all your medications.    As you primary medical provider recommend that you make an appointment for your Annual Wellness Visit if you have not done so already this year.  You may set up this appointment before you leave today or you may call back ([phone]) and schedule an appointment.  Please make sure when you call that you mention that you are scheduling your Annual Wellness Visit with the clinical pharmacist so that the appointment may be made for the proper length of time.      Continue current medications. Continue good therapeutic lifestyle changes which include good diet and exercise. Fall precautions discussed with patient. If an FOBT was given today- please return it to our front desk. If you are over 35 years old - you may need Prevnar 70 or the adult Pneumonia vaccine.  **Flu shots are available--- please call and schedule a FLU-CLINIC appointment**  After your visit with [place] today you will receive a survey in the mail or online from [employer] regarding your care with [place]. Please take a moment to fill this out. Your feedback is very important to [place] as you can help [place] better understand your patient needs as well as improve your experience and satisfaction. WE CARE ABOUT YOU!!!   No climbing into the attic! Continue to follow-up with pulmonology and get CT scan as planned Drink plenty of fluids and stay well hydrated    Arrie Senate MD

## 2016-08-22 NOTE — Patient Instructions (Addendum)
Medicare Annual Wellness Visit  [facility] and the medical providers at [facility] strive to bring you the best medical care.  In doing so we not only want to address your current medical conditions and concerns but also to detect new conditions early and prevent illness, disease and health-related problems.    Medicare offers a yearly Wellness Visit which allows our clinical staff to assess your need for preventative services including immunizations, lifestyle education, counseling to decrease risk of preventable diseases and screening for fall risk and other medical concerns.    This visit is provided free of charge (no copay) for all Medicare recipients. The clinical pharmacists at [facility] have begun to conduct these Wellness Visits which will also include a thorough review of all your medications.    As you primary medical provider recommend that you make an appointment for your Annual Wellness Visit if you have not done so already this year.  You may set up this appointment before you leave today or you may call back ([phone]) and schedule an appointment.  Please make sure when you call that you mention that you are scheduling your Annual Wellness Visit with the clinical pharmacist so that the appointment may be made for the proper length of time.     Continue current medications. Continue good therapeutic lifestyle changes which include good diet and exercise. Fall precautions discussed with patient. If an FOBT was given today- please return it to our front desk. If you are over 29 years old - you may need Prevnar 11 or the adult Pneumonia vaccine.  **Flu shots are available--- please call and schedule a FLU-CLINIC appointment**  After your visit with [place] today you will receive a survey in the mail or online from [employer] regarding your care with [place]. Please take a moment to fill this out. Your feedback is very  important to [place] as you can help [place] better understand your patient needs as well as improve your experience and satisfaction. WE CARE ABOUT YOU!!!   No climbing into the attic! Continue to follow-up with pulmonology and get CT scan as planned Drink plenty of fluids and stay well hydrated

## 2016-08-22 NOTE — Telephone Encounter (Signed)
We have documented on the order and will call her in November  To schedule this I have also called the daughter and left her a voicemail stating this

## 2016-08-23 LAB — HEPATIC FUNCTION PANEL
ALBUMIN: 4.3 g/dL (ref 3.5–4.7)
ALK PHOS: 74 IU/L (ref 39–117)
ALT: 7 IU/L (ref 0–32)
AST: 23 IU/L (ref 0–40)
Bilirubin Total: 0.3 mg/dL (ref 0.0–1.2)
Bilirubin, Direct: 0.1 mg/dL (ref 0.00–0.40)
Total Protein: 6.5 g/dL (ref 6.0–8.5)

## 2016-08-23 LAB — CBC WITH DIFFERENTIAL/PLATELET
BASOS: 1 %
Basophils Absolute: 0 10*3/uL (ref 0.0–0.2)
EOS (ABSOLUTE): 0.4 10*3/uL (ref 0.0–0.4)
EOS: 6 %
HEMATOCRIT: 39.6 % (ref 34.0–46.6)
HEMOGLOBIN: 13.2 g/dL (ref 11.1–15.9)
Immature Grans (Abs): 0 10*3/uL (ref 0.0–0.1)
Immature Granulocytes: 0 %
LYMPHS: 15 %
Lymphocytes Absolute: 0.9 10*3/uL (ref 0.7–3.1)
MCH: 28.8 pg (ref 26.6–33.0)
MCHC: 33.3 g/dL (ref 31.5–35.7)
MCV: 86 fL (ref 79–97)
MONOCYTES: 12 %
Monocytes Absolute: 0.7 10*3/uL (ref 0.1–0.9)
NEUTROS ABS: 4.1 10*3/uL (ref 1.4–7.0)
Neutrophils: 66 %
Platelets: 274 10*3/uL (ref 150–379)
RBC: 4.59 x10E6/uL (ref 3.77–5.28)
RDW: 14.3 % (ref 12.3–15.4)
WBC: 6.2 10*3/uL (ref 3.4–10.8)

## 2016-08-23 LAB — LIPID PANEL
Chol/HDL Ratio: 2.8 ratio (ref 0.0–4.4)
Cholesterol, Total: 165 mg/dL (ref 100–199)
HDL: 60 mg/dL (ref >39)
LDL Calculated: 83 mg/dL (ref 0–99)
Triglycerides: 111 mg/dL (ref 0–149)
VLDL CHOLESTEROL CAL: 22 mg/dL (ref 5–40)

## 2016-08-23 LAB — BMP8+EGFR
BUN / CREAT RATIO: 13 (ref 12–28)
BUN: 10 mg/dL (ref 8–27)
CO2: 25 mmol/L (ref 20–29)
CREATININE: 0.78 mg/dL (ref 0.57–1.00)
Calcium: 9.4 mg/dL (ref 8.7–10.3)
Chloride: 101 mmol/L (ref 96–106)
GFR, EST AFRICAN AMERICAN: 78 mL/min/{1.73_m2} (ref >59)
GFR, EST NON AFRICAN AMERICAN: 68 mL/min/{1.73_m2} (ref >59)
Glucose: 96 mg/dL (ref 65–99)
Potassium: 4 mmol/L (ref 3.5–5.2)
SODIUM: 140 mmol/L (ref 134–144)

## 2016-08-23 LAB — VITAMIN D 25 HYDROXY (VIT D DEFICIENCY, FRACTURES): VIT D 25 HYDROXY: 35.1 ng/mL (ref 30.0–100.0)

## 2016-08-23 LAB — VITAMIN B12: VITAMIN B 12: 218 pg/mL — AB (ref 232–1245)

## 2016-08-27 ENCOUNTER — Other Ambulatory Visit: Payer: Self-pay

## 2016-08-27 ENCOUNTER — Ambulatory Visit (INDEPENDENT_AMBULATORY_CARE_PROVIDER_SITE_OTHER): Payer: Medicare Other | Admitting: *Deleted

## 2016-08-27 DIAGNOSIS — R7989 Other specified abnormal findings of blood chemistry: Secondary | ICD-10-CM

## 2016-08-27 DIAGNOSIS — E538 Deficiency of other specified B group vitamins: Secondary | ICD-10-CM

## 2016-08-27 DIAGNOSIS — D531 Other megaloblastic anemias, not elsewhere classified: Secondary | ICD-10-CM

## 2016-08-27 LAB — THYROID PANEL WITH TSH
FREE THYROXINE INDEX: 2.4 (ref 1.2–4.9)
T3 UPTAKE RATIO: 25 % (ref 24–39)
T4 TOTAL: 9.7 ug/dL (ref 4.5–12.0)
TSH: 0.387 u[IU]/mL — ABNORMAL LOW (ref 0.450–4.500)

## 2016-08-27 LAB — SPECIMEN STATUS REPORT

## 2016-08-27 MED ORDER — CYANOCOBALAMIN 1000 MCG/ML IJ SOLN
1000.0000 ug | INTRAMUSCULAR | Status: AC
  Administered 2016-08-27 – 2016-09-03 (×2): 1000 ug via INTRAMUSCULAR

## 2016-08-27 NOTE — Progress Notes (Signed)
Pt given Cyanocobalamin inj Tolerated well 

## 2016-09-03 ENCOUNTER — Ambulatory Visit (INDEPENDENT_AMBULATORY_CARE_PROVIDER_SITE_OTHER): Payer: Medicare Other | Admitting: *Deleted

## 2016-09-03 DIAGNOSIS — D531 Other megaloblastic anemias, not elsewhere classified: Secondary | ICD-10-CM

## 2016-09-03 NOTE — Progress Notes (Signed)
Pt given Cyanocobalamin inj Tolerated well 

## 2016-09-09 DIAGNOSIS — H04123 Dry eye syndrome of bilateral lacrimal glands: Secondary | ICD-10-CM | POA: Diagnosis not present

## 2016-09-09 DIAGNOSIS — Z961 Presence of intraocular lens: Secondary | ICD-10-CM | POA: Diagnosis not present

## 2016-09-09 DIAGNOSIS — H3540 Unspecified peripheral retinal degeneration: Secondary | ICD-10-CM | POA: Diagnosis not present

## 2016-09-23 ENCOUNTER — Other Ambulatory Visit: Payer: Self-pay | Admitting: Family Medicine

## 2016-10-03 ENCOUNTER — Encounter: Payer: Self-pay | Admitting: Internal Medicine

## 2016-10-03 ENCOUNTER — Ambulatory Visit (INDEPENDENT_AMBULATORY_CARE_PROVIDER_SITE_OTHER): Payer: Medicare Other

## 2016-10-03 ENCOUNTER — Ambulatory Visit (INDEPENDENT_AMBULATORY_CARE_PROVIDER_SITE_OTHER): Payer: Medicare Other | Admitting: Internal Medicine

## 2016-10-03 VITALS — BP 124/68 | HR 59 | Ht 59.0 in | Wt 117.0 lb

## 2016-10-03 DIAGNOSIS — J84112 Idiopathic pulmonary fibrosis: Secondary | ICD-10-CM | POA: Diagnosis not present

## 2016-10-03 DIAGNOSIS — R911 Solitary pulmonary nodule: Secondary | ICD-10-CM | POA: Diagnosis not present

## 2016-10-03 NOTE — Patient Instructions (Signed)
ICD-10-CM   1. IPF (idiopathic pulmonary fibrosis) ([place]) J84.112   2. Nodule of right lung R91.1     Lung disease is stable  Plan High dose flu shot deferred 10/03/2016 and do this next week with pcp Chipper Herb, MD Continue observational supportive care for IPF Do CT scan chest in Dec 2018 as scheduled - will call with results (I think already in schedule)  Followup 4-5 months from now with Dr Chase Caller (you do not have to come in to discuss CT Results but if results need you to come in will let you know )

## 2016-10-03 NOTE — Progress Notes (Signed)
Subjective:     Patient ID: [name], female   DOB: 08-Sep-1925, 83 y.o.   MRN: [id number]  HPI   PCP Redge Gainer, MD   IOV 07/07/2015  Chief Complaint  Patient presents with  . Advice Only    Referred by Dr. Laurance Flatten for pulmonary nodule.       83 year old female accompanied by her daughter [name]. Referred for pulmonary nodule. According to the daughter and the patient will Sr. he is not fully clear with a long-standing history of pulmonary nodule that has been followed locally for many years review of the chart shows that she's had CT scan of the chest for many years. The CT scans of the chest. All the way back to 2004/2006 and described pulmonary fibrosis and nodule. I only have the images from 2014 that shows bilateral upper lobe pulmonary fibrosis with some involvement in the lower lobes. Non-UIP pattern. Most recently she had CT scan of the chest 06/30/2015. According to the daughter ration had some infectious symptoms at this time but again the CT was being done to evaluate for follow-up nodule and a new nodule was discovered which is described to be in the right upper lobe 6 mm and therefore has been referred here. Currently patient is feeling well baseline which is just mild intermittent occasional cough and shortness of breath with wheezing for which she is on maintenance inhaler possibly Brio which helps. Overall quality of life is fine and she does not wish for aggressive intervention.  I personally visualized the CT chest below. In terms of pulmonary fibrosis she is unaware of the diagnosis. Inour chart she's not had any autoimmune workup. She does live in an extremely old house over 3 years old. They think there might be some mold in it. She has chronic arthralgia but no diagnosed autoimmune disease   CT chest 06/30/15 EXAM: CT CHEST WITHOUT CONTRAST  TECHNIQUE: Multidetector CT imaging of the chest was performed following the standard protocol without IV  contrast.  COMPARISON: Chest radiograph 08/29/2015  FINDINGS: Mediastinum/Lymph Nodes: There are multiple borderline enlarged mediastinal lymph nodes, with reactive appearance. The heart is mildly enlarged. There is no pericardial effusion. Calcified atherosclerotic disease of the coronary arteries is seen.  Lungs/Pleura: There is no evidence of pneumothorax, pleural effusion or acute airspace consolidation. There are subpleural with upper lobe predominance fibrotic changes of the lungs bilaterally with coarsening of the interstitial markings, mild honeycombing and traction bronchiectasis. An area of scarring versus pulmonary nodule is seen in the subpleural right upper lobe and measures 6.4 mm (image 40/148, sequence 3). Mild pleural thickening is noted anteriorly at the level of the right middle lobe/ lingula.  Upper abdomen: No acute findings.  Musculoskeletal: No chest wall mass or suspicious bone lesions identified.  IMPRESSION: Upper lobe predominant chronic interstitial lung disease.  Multiple borderline enlarged mediastinal lymph nodes.  Differential diagnosis includes ankylosing spondylitis, sarcoidosis, silicosis, eosinophilic granuloma or prior granulomatous infection such as tuberculosis.  6.4 mm right upper lobe pulmonary nodule versus area of prominent scarring. Initial follow-up with CT at 6-12 months is recommended to confirm persistence. If persistent, repeat CT is recommended every 2 years until 5 years of stability has been established. This recommendation follows the consensus statement: Guidelines for Management of Incidental Pulmonary Nodules Detected on CT Images:From the Fleischner Society 2017; published online before print (10.1148/radiol.[id number]).   Electronically Signed  By: Fidela Salisbury M.D.  On: 06/30/2015 17:20  OV 02/15/2016  Chief Complaint  Patient presents with  .  Follow-up    Pt here after CT chest. Pt  states her breathing is unchanged since last OV. Pt denies cough and CP/tightness.    Follow-up 6 mm right upper lobe nodule  In follow-up interstitial lung disease not otherwise specified base in June 2017 CT chest  She had follow-up CT chest recommended below that at personally visualized with her and her daughter [name]. According to me the right upper lobe nodule is stable and I agree with the report. The interstitial lung disease pattern appears to be one of possible UIP. She has an elevated rheumatoid factor but negative CCP. Did tell me that they saw Dr. Trudie Reed rheumatologist and has been reassured that there is no systemic evidence of rheumatoid arthritis. I do not have the chart with me. Overall she's stable but does get dyspneic doing gardening and then relieved by rest and it is no chest pain. She wants to continue to be as physically active as possible she is not interested in pulmonary rehabilitation.  Of note I did review her CT chest in 2013 and 2017. Today I think ILD slowly progressive IMPRESSION: 1. Stable CT of the chest. 2. Similar appearance of chronic interstitial lung disease compatible with pulmonary fibrosis. This may be secondary to nonspecific interstitial pneumonia or usual interstitial pneumonia (UIP.) 3. Small pulmonary nodules are again noted and appears stable from 06/30/2015. Non-contrast chest CT can be considered at 12 months (from 07/01/2015) if patient is high-risk. This recommendation follows the consensus statement: Guidelines for Management of Incidental Pulmonary Nodules Detected on CT Images: From the Fleischner Society 2017; Radiology 2017; [phone]. 4.   Electronically Signed   By: Kerby Moors M.D.   On: 12/19/2015 14:54  OV 08/09/2016  Chief Complaint  Patient presents with  . Follow-up    Pt states her breathing is unchanged sicne last OV. Pt states she has a dry cough. Pt denies CP/tightness and f/c/s.    Follow-up idiopathic  pulmonary fibrosis diagnosis based based on age, possible UIP on CT scan and negative rheumatology evaluation. Diagnosis given 02/15/2016  Follow-up lung nodule  Overall she's doing stable. Today in the office she walked coronary 5 feet 3 laps on room air: She was unable to do  and then stopped because of hip pain and knee pain is chronic issues. She continues to live alone and her daughter [name] is with her today. She is functional in the house able to gardening but then stopped because of dyspnea. She does have some wheeze for which she takes Brio although there is no emphysema on the CT scan.   OV 10/03/2016    Chief Complaint  Patient presents with  . Follow-up    Pt attempted to do the PFT but was unable to complete. Denies any comlaints or concerns. Occ. SOB. Denies any CP or cough.    Follow-up IPF on basic supportive care. Last saw her in June 2018. At this point now she is stable. We will gardening. She complains of dyspnea on exertion relieved by rest it is mild to moderate. It is not any worse. She could not do her PFTs today. In the past walked this also failed. I told her that we will just follow her symptoms. She is but is pitting in the IPF registry by BI . Daugher [name] with her   follow-up lung nodule: She has her next scans December 18     has a past medical history of Arthritis; Backache, unspecified; Cataract; Glaucoma; Lumbar stenosis; Osteoarthrosis and  allied disorders; Osteoporosis; Other and unspecified hyperlipidemia; Peripheral neuropathy; and Vitamin D deficiency.   reports that she has never smoked. She has never used smokeless tobacco.  Past Surgical History:  Procedure Laterality Date  . ABDOMINAL HYSTERECTOMY    . CATARACT EXTRACTION    . EYE SURGERY Bilateral    cataract  . KNEE ARTHROSCOPY    . LUMBAR LAMINECTOMY      Allergies  Allergen Reactions  . Levaquin [Levaquin Leva-Pak]     Patient cannot remember that she is sensitive to this  medication; has never heard of it.  . Mucinex [Guaifenesin Er]     Causes anxiety  . Erythromycin Diarrhea  . Penicillins Rash    Immunization History  Administered Date(s) Administered  . Influenza Split 10/28/2012  . Influenza Whole 09/14/2009  . Influenza, Quadrivalent, Recombinant, Inj, Pf 11/08/2015  . Influenza,inj,Quad PF,6+ Mos 10/24/2014  . Influenza,inj,quad, With Preservative 11/10/2013  . Pneumococcal Conjugate-13 12/31/2012  . Pneumococcal Polysaccharide-23 10/14/1996  . Td 10/15/2002  . Zoster 02/05/2010    Family History  Problem Relation Age of Onset  . Cancer Mother        bone  . Cancer Sister        breast  . Diabetes Brother   . Asthma Daughter   . COPD Son   . Asthma Son      Current Outpatient Prescriptions:  .  alendronate (FOSAMAX) 70 MG tablet, TAKE 1 TABLET WEEKLY (TAKE WITH 8OZ OF WATER 30 MINUTES BEFORE BREAKFAST), Disp: 12 tablet, Rfl: 2 .  BREO ELLIPTA 100-25 MCG/INH AEPB, USE 1 INHALATION DAILY, Disp: 60 each, Rfl: 2 .  calcium carbonate (OS-CAL) 600 MG TABS tablet, Take 600 mg by mouth daily with breakfast., Disp: , Rfl:  .  cephALEXin (KEFLEX) 500 MG capsule, Take 1 capsule by mouth 3 (three) times daily. X 1 week - then 1 nightly (dr Jeffie Pollock), Disp: , Rfl:  .  cholecalciferol (VITAMIN D) 1000 UNITS tablet, Take 1,000 Units by mouth daily., Disp: , Rfl:  .  fluticasone (FLONASE) 50 MCG/ACT nasal spray, USE 2 SPRAYS IN EACH NOSTRIL DAILY, Disp: 16 g, Rfl: 4 .  ibuprofen (ADVIL,MOTRIN) 200 MG tablet, Take 200 mg by mouth every 6 (six) hours as needed., Disp: , Rfl:  .  Multiple Vitamins-Minerals (CENTRUM SILVER) tablet, Take 1 tablet by mouth every evening., Disp: , Rfl:  .  ranitidine (ZANTAC) 150 MG tablet, Take 150 mg by mouth 2 (two) times daily., Disp: , Rfl:  .  simvastatin (ZOCOR) 80 MG tablet, TAKE ONE TABLET DAILY AT BEDTIME, Disp: 90 tablet, Rfl: 1   Review of Systems     Objective:   Physical Exam Vitals:   10/03/16 1025   BP: 124/68  Pulse: (!) 59  SpO2: 99%  Weight: 117 lb (53.1 kg)  Height: 4\' 11"  (1.499 m)    Estimated body mass index is 23.63 kg/m as calculated from the following:   Height as of this encounter: 4\' 11"  (1.499 m).   Weight as of this encounter: 117 lb (53.1 kg).  Frail female alert and oriented 3 Some fine basilar crackles Normal heart sounds abdomen is soft  normal oral cavity       Assessment:       ICD-10-CM   1. IPF (idiopathic pulmonary fibrosis) ([place]) J84.112   2. Nodule of right lung R91.1        Plan:       Lung disease is stable  Plan High dose flu  shot deferred 10/03/2016 and do this next week with pcp Chipper Herb, MD Continue observational supportive care for IPF Do CT scan chest in Dec 2018 as scheduled - will call with results (I think already in schedule)  Followup 4-5 months from now with Dr Chase Caller (you do not have to come in to discuss CT Results but if results need you to come in will let you know )   Dr. Brand Males, M.D., [facility].C.P Pulmonary and Critical Care Medicine Staff Physician [facility] Pager: [phone], If no answer or between  15:00h - 7:00h: call [phone]  10/03/2016 10:51 AM

## 2016-10-07 ENCOUNTER — Ambulatory Visit (INDEPENDENT_AMBULATORY_CARE_PROVIDER_SITE_OTHER): Payer: Medicare Other | Admitting: *Deleted

## 2016-10-07 DIAGNOSIS — Z23 Encounter for immunization: Secondary | ICD-10-CM

## 2016-10-07 DIAGNOSIS — E538 Deficiency of other specified B group vitamins: Secondary | ICD-10-CM

## 2016-10-07 MED ORDER — CYANOCOBALAMIN 1000 MCG/ML IJ SOLN
1000.0000 ug | INTRAMUSCULAR | Status: AC
  Administered 2016-10-07 – 2019-03-16 (×28): 1000 ug via INTRAMUSCULAR

## 2016-10-07 NOTE — Progress Notes (Signed)
Pt given Cyanocobalamin inj and flu vaccine Pt tolerated well

## 2016-10-11 ENCOUNTER — Ambulatory Visit (INDEPENDENT_AMBULATORY_CARE_PROVIDER_SITE_OTHER): Payer: Medicare Other | Admitting: Urology

## 2016-10-11 DIAGNOSIS — N3946 Mixed incontinence: Secondary | ICD-10-CM

## 2016-10-11 DIAGNOSIS — N3020 Other chronic cystitis without hematuria: Secondary | ICD-10-CM

## 2016-11-07 ENCOUNTER — Ambulatory Visit (INDEPENDENT_AMBULATORY_CARE_PROVIDER_SITE_OTHER): Payer: Medicare Other | Admitting: *Deleted

## 2016-11-07 DIAGNOSIS — H02132 Senile ectropion of right lower eyelid: Secondary | ICD-10-CM | POA: Diagnosis not present

## 2016-11-07 DIAGNOSIS — H04123 Dry eye syndrome of bilateral lacrimal glands: Secondary | ICD-10-CM | POA: Diagnosis not present

## 2016-11-07 DIAGNOSIS — H02135 Senile ectropion of left lower eyelid: Secondary | ICD-10-CM | POA: Diagnosis not present

## 2016-11-07 DIAGNOSIS — E538 Deficiency of other specified B group vitamins: Secondary | ICD-10-CM | POA: Diagnosis not present

## 2016-11-07 DIAGNOSIS — Z1231 Encounter for screening mammogram for malignant neoplasm of breast: Secondary | ICD-10-CM | POA: Diagnosis not present

## 2016-11-07 DIAGNOSIS — Z961 Presence of intraocular lens: Secondary | ICD-10-CM | POA: Diagnosis not present

## 2016-11-07 NOTE — Progress Notes (Signed)
Pt given Cyanocobalamin inj Tolerated well 

## 2016-11-19 DIAGNOSIS — H02132 Senile ectropion of right lower eyelid: Secondary | ICD-10-CM | POA: Diagnosis not present

## 2016-11-19 DIAGNOSIS — H02135 Senile ectropion of left lower eyelid: Secondary | ICD-10-CM | POA: Diagnosis not present

## 2016-12-02 DIAGNOSIS — H02132 Senile ectropion of right lower eyelid: Secondary | ICD-10-CM | POA: Diagnosis not present

## 2016-12-02 DIAGNOSIS — H02135 Senile ectropion of left lower eyelid: Secondary | ICD-10-CM | POA: Diagnosis not present

## 2016-12-02 DIAGNOSIS — H02103 Unspecified ectropion of right eye, unspecified eyelid: Secondary | ICD-10-CM | POA: Diagnosis not present

## 2016-12-02 DIAGNOSIS — H02106 Unspecified ectropion of left eye, unspecified eyelid: Secondary | ICD-10-CM | POA: Diagnosis not present

## 2016-12-09 ENCOUNTER — Ambulatory Visit (INDEPENDENT_AMBULATORY_CARE_PROVIDER_SITE_OTHER): Payer: Medicare Other | Admitting: *Deleted

## 2016-12-09 DIAGNOSIS — E538 Deficiency of other specified B group vitamins: Secondary | ICD-10-CM

## 2016-12-09 NOTE — Progress Notes (Signed)
Pt given Cyanocobalamin inj Tolerated well 

## 2016-12-16 ENCOUNTER — Ambulatory Visit (HOSPITAL_COMMUNITY)
Admission: RE | Admit: 2016-12-16 | Discharge: 2016-12-16 | Disposition: A | Discharge status: Home / Self Care | Payer: Medicare Other | Source: Ambulatory Visit | Attending: Internal Medicine | Admitting: Internal Medicine

## 2016-12-16 DIAGNOSIS — I2510 Atherosclerotic heart disease of native coronary artery without angina pectoris: Secondary | ICD-10-CM | POA: Insufficient documentation

## 2016-12-16 DIAGNOSIS — J849 Interstitial pulmonary disease, unspecified: Secondary | ICD-10-CM | POA: Insufficient documentation

## 2016-12-16 DIAGNOSIS — R911 Solitary pulmonary nodule: Secondary | ICD-10-CM

## 2016-12-16 DIAGNOSIS — R918 Other nonspecific abnormal finding of lung field: Secondary | ICD-10-CM | POA: Diagnosis not present

## 2016-12-16 DIAGNOSIS — I700 Atherosclerosis of aorta: Secondary | ICD-10-CM | POA: Insufficient documentation

## 2016-12-25 ENCOUNTER — Ambulatory Visit: Payer: Medicare Other | Admitting: Family Medicine

## 2017-01-09 ENCOUNTER — Ambulatory Visit: Payer: Medicare Other

## 2017-01-09 ENCOUNTER — Ambulatory Visit: Payer: Medicare Other | Admitting: Family Medicine

## 2017-01-24 ENCOUNTER — Ambulatory Visit (INDEPENDENT_AMBULATORY_CARE_PROVIDER_SITE_OTHER): Payer: Medicare Other | Admitting: Family Medicine

## 2017-01-24 ENCOUNTER — Encounter: Payer: Self-pay | Admitting: Family Medicine

## 2017-01-24 VITALS — BP 126/69 | HR 68 | Temp 97.8°F | Ht 59.0 in | Wt 112.0 lb

## 2017-01-24 DIAGNOSIS — M545 Low back pain, unspecified: Secondary | ICD-10-CM

## 2017-01-24 DIAGNOSIS — E538 Deficiency of other specified B group vitamins: Secondary | ICD-10-CM | POA: Diagnosis not present

## 2017-01-24 DIAGNOSIS — E559 Vitamin D deficiency, unspecified: Secondary | ICD-10-CM

## 2017-01-24 DIAGNOSIS — J849 Interstitial pulmonary disease, unspecified: Secondary | ICD-10-CM | POA: Diagnosis not present

## 2017-01-24 DIAGNOSIS — G8929 Other chronic pain: Secondary | ICD-10-CM

## 2017-01-24 DIAGNOSIS — M48061 Spinal stenosis, lumbar region without neurogenic claudication: Secondary | ICD-10-CM

## 2017-01-24 DIAGNOSIS — J84112 Idiopathic pulmonary fibrosis: Secondary | ICD-10-CM | POA: Diagnosis not present

## 2017-01-24 DIAGNOSIS — R634 Abnormal weight loss: Secondary | ICD-10-CM | POA: Diagnosis not present

## 2017-01-24 DIAGNOSIS — E7800 Pure hypercholesterolemia, unspecified: Secondary | ICD-10-CM | POA: Diagnosis not present

## 2017-01-24 NOTE — Progress Notes (Signed)
Subjective:    Patient ID: [name], female    DOB: [birth year], 84 y.o.   MRN: [id number]  HPI Pt here for follow up and management of chronic medical problems which includes hyperlipidemia. She is taking medication regularly.  This patient is followed regularly by the pulmonologist for her idiopathic pulmonary fibrosis.  It is also been described as interstitial lung disease.  She has chronic cystitis spinal stenosis of the lumbar spine and vitamin D deficiency.  She comes to the visit today with her daughter.  She has had some weight loss of about 20 pounds in the past year.  We will make sure that we get lab work today including a thyroid profile.  Patient denies any chest pain or shortness of breath or trouble with her intestinal tract including nausea vomiting diarrhea blood in the stool or black tarry bowel movements.  She is passing her water well.  She says her breathing is just as good as it was 10 years ago.  She does not have any cough or congestion.  She is happy with living in her home keeping her home clean and having her garden in the spring.  Her family keeps close check on her.  She does take neutrophil at which is some additional nourishment plus vitamin D and we will make sure she gets on a good multivitamin.  She is due to get her B12 shot today.  She continues to do well as far as her back pain is concerned and she is not had to have any shots in her back for a couple of years.  She sees Dr. Christella Noa, the neurosurgeon for this.     Patient Active Problem List   Diagnosis Date Noted  . IPF (idiopathic pulmonary fibrosis) ([place]) 08/09/2016  . Interstitial lung disease ([place]) 07/07/2015  . Nodule of right lung 07/07/2015  . Chronic cystitis 01/08/2013  . Vitamin D deficiency 12/31/2012  . Osteoporosis 12/31/2012  . Spinal stenosis of lumbar region 12/31/2012  . Hyperlipidemia 07/16/2012  . Chronic low back pain 07/16/2012  . Vitamin B12 deficient megaloblastic anemia  07/16/2012   Outpatient Encounter Medications as of 01/24/2017  Medication Sig  . alendronate (FOSAMAX) 70 MG tablet TAKE 1 TABLET WEEKLY (TAKE WITH 8OZ OF WATER 30 MINUTES BEFORE BREAKFAST)  . BREO ELLIPTA 100-25 MCG/INH AEPB USE 1 INHALATION DAILY  . calcium carbonate (OS-CAL) 600 MG TABS tablet Take 600 mg by mouth daily with breakfast.  . cholecalciferol (VITAMIN D) 1000 UNITS tablet Take 1,000 Units by mouth daily.  . fluticasone (FLONASE) 50 MCG/ACT nasal spray USE 2 SPRAYS IN EACH NOSTRIL DAILY  . ibuprofen (ADVIL,MOTRIN) 200 MG tablet Take 200 mg by mouth every 6 (six) hours as needed.  . Multiple Vitamins-Minerals (CENTRUM SILVER) tablet Take 1 tablet by mouth every evening.  . ranitidine (ZANTAC) 150 MG tablet Take 150 mg by mouth 2 (two) times daily.  . simvastatin (ZOCOR) 80 MG tablet TAKE ONE TABLET DAILY AT BEDTIME  . cephALEXin (KEFLEX) 500 MG capsule Take 1 capsule by mouth 3 (three) times daily. X 1 week - then 1 nightly (dr Jeffie Pollock)   Facility-Administered Encounter Medications as of 01/24/2017  Medication  . cyanocobalamin ((VITAMIN B-12)) injection 1,000 mcg      Review of Systems  Constitutional: Positive for unexpected weight change (weight loss ).  HENT: Negative.   Eyes: Negative.   Respiratory: Negative.   Cardiovascular: Negative.   Gastrointestinal: Negative.   Endocrine: Negative.   Genitourinary: Negative.  Musculoskeletal: Negative.   Skin: Negative.   Allergic/Immunologic: Negative.   Neurological: Negative.   Hematological: Negative.   Psychiatric/Behavioral: Negative.        Objective:   Physical Exam  Constitutional: She is oriented to person, place, and time. No distress.  Patient is elderly appearing but pleasant and alert and continues to live by herself and comes to the visit today with her daughter.  HENT:  Head: Normocephalic and atraumatic.  Right Ear: External ear normal.  Left Ear: External ear normal.  Nose: Nose normal.    Mouth/Throat: Oropharynx is clear and moist. No oropharyngeal exudate.  Eyes: Conjunctivae and EOM are normal. Pupils are equal, round, and reactive to light. Right eye exhibits no discharge. Left eye exhibits no discharge. No scleral icterus.  Neck: Normal range of motion. Neck supple. No thyromegaly present.  No bruits thyromegaly or anterior cervical adenopathy  Cardiovascular: Normal rate, regular rhythm, normal heart sounds and intact distal pulses.  No murmur heard. Heart is regular at 72/min with good pedal pulses bilaterally  Pulmonary/Chest: Effort normal. No respiratory distress. She has wheezes. She has no rales.  Fibrotic sounds with deep breathing with minimal wheezes.  Abdominal: Soft. Bowel sounds are normal. She exhibits no mass. There is no tenderness. There is no rebound and no guarding.  Abdomen is soft without masses tenderness or organ enlargement bruits or inguinal adenopathy  Genitourinary:  Genitourinary Comments: Both breasts are atrophic with no masses being felt and no axillary adenopathy  Musculoskeletal: Normal range of motion. She exhibits no edema.  Somewhat kyphotic posture but able to ambulate well without assistance  Lymphadenopathy:    She has no cervical adenopathy.  Neurological: She is alert and oriented to person, place, and time. She has normal reflexes. No cranial nerve deficit.  Skin: Skin is warm and dry. No rash noted.  Psychiatric: She has a normal mood and affect. Her behavior is normal. Judgment and thought content normal.  Nursing note and vitals reviewed.    BP 126/69 (BP Location: Left Arm)   Pulse 68   Temp 97.8 F (36.6 C) (Oral)   Ht '4\' 11"'$  (1.499 m)   Wt 112 lb (50.8 kg)   BMI 22.62 kg/m        Assessment & Plan:  1. Vitamin D deficiency -Continue current treatment pending results of lab work - CBC with Differential/Platelet - VITAMIN D 25 Hydroxy (Vit-D Deficiency, Fractures)  2. Pure hypercholesterolemia -Continue  current treatment and aggressive therapeutic lifestyle changes as much as possible - BMP8+EGFR - CBC with Differential/Platelet - Lipid panel - Hepatic function panel  3. B12 deficiency -B12 shot today. - CBC with Differential/Platelet  4. Chronic bilateral low back pain without sciatica -This is stable for this patient and she must continue to be careful and not put herself at risk for falling and avoid climbing  5. Idiopathic pulmonary fibrosis ([place]) -Follow-up with pulmonology as planned the end of the month  6. Interstitial lung disease ([place]) -Follow-up with pulmonology  7. Spinal stenosis of lumbar region without neurogenic claudication -Continue to be careful do not put yourself at risk for falling, she is doing well with her back presently.  Patient Instructions                       Medicare Annual Wellness Visit  [facility] [facility] and the medical providers at [facility] strive to bring you the best medical care.  In doing so we not  only want to address your current medical conditions and concerns but also to detect new conditions early and prevent illness, disease and health-related problems.    Medicare offers a yearly Wellness Visit which allows our clinical staff to assess your need for preventative services including immunizations, lifestyle education, counseling to decrease risk of preventable diseases and screening for fall risk and other medical concerns.    This visit is provided free of charge (no copay) for all Medicare recipients. The clinical pharmacists at [facility] have begun to conduct these Wellness Visits which will also include a thorough review of all your medications.    As you primary medical provider recommend that you make an appointment for your Annual Wellness Visit if you have not done so already this year.  You may set up this appointment before you leave today or you may call back ([phone]) and schedule an  appointment.  Please make sure when you call that you mention that you are scheduling your Annual Wellness Visit with the clinical pharmacist so that the appointment may be made for the proper length of time.     Continue current medications. Continue good therapeutic lifestyle changes which include good diet and exercise. Fall precautions discussed with patient. If an FOBT was given today- please return it to our front desk. If you are over 48 years old - you may need Prevnar 44 or the adult Pneumonia vaccine.  **Flu shots are available--- please call and schedule a FLU-CLINIC appointment**  After your visit with [place] today you will receive a survey in the mail or online from [employer] regarding your care with [place]. Please take a moment to fill this out. Your feedback is very important to [place] as you can help [place] better understand your patient needs as well as improve your experience and satisfaction. WE CARE ABOUT YOU!!!   Follow-up with pulmonologist as planned Keep house as cool as possible Use cool mist humidifier in the house to put moisture in the air Take Mucinex 1 twice daily for cough and congestion if needed Drink plenty of fluids and stay well-hydrated Always be careful do not put yourself at risk for falling and move slowly  Arrie Senate MD

## 2017-01-24 NOTE — Addendum Note (Signed)
Addended by: Zannie Cove on: 01/24/2017 03:27 PM   Modules accepted: Orders

## 2017-01-24 NOTE — Patient Instructions (Addendum)
Medicare Annual Wellness Visit  [facility] and the medical providers at [facility] strive to bring you the best medical care.  In doing so we not only want to address your current medical conditions and concerns but also to detect new conditions early and prevent illness, disease and health-related problems.    Medicare offers a yearly Wellness Visit which allows our clinical staff to assess your need for preventative services including immunizations, lifestyle education, counseling to decrease risk of preventable diseases and screening for fall risk and other medical concerns.    This visit is provided free of charge (no copay) for all Medicare recipients. The clinical pharmacists at [facility] have begun to conduct these Wellness Visits which will also include a thorough review of all your medications.    As you primary medical provider recommend that you make an appointment for your Annual Wellness Visit if you have not done so already this year.  You may set up this appointment before you leave today or you may call back [phone]) and schedule an appointment.  Please make sure when you call that you mention that you are scheduling your Annual Wellness Visit with the clinical pharmacist so that the appointment may be made for the proper length of time.     Continue current medications. Continue good therapeutic lifestyle changes which include good diet and exercise. Fall precautions discussed with patient. If an FOBT was given today- please return it to our front desk. If you are over 82 years old - you may need Prevnar 73 or the adult Pneumonia vaccine.  **Flu shots are available--- please call and schedule a FLU-CLINIC appointment**  After your visit with [place] today you will receive a survey in the mail or online from [employer] regarding your care with [place]. Please take a moment to fill this out. Your feedback is very  important to [place] as you can help [place] better understand your patient needs as well as improve your experience and satisfaction. WE CARE ABOUT YOU!!!   Follow-up with pulmonologist as planned Keep house as cool as possible Use cool mist humidifier in the house to put moisture in the air Take Mucinex 1 twice daily for cough and congestion if needed Drink plenty of fluids and stay well-hydrated Always be careful do not put yourself at risk for falling and move slowly

## 2017-01-25 LAB — CBC WITH DIFFERENTIAL/PLATELET
BASOS: 1 %
Basophils Absolute: 0 10*3/uL (ref 0.0–0.2)
EOS (ABSOLUTE): 0.1 10*3/uL (ref 0.0–0.4)
Eos: 2 %
Hematocrit: 39.3 % (ref 34.0–46.6)
Hemoglobin: 12.5 g/dL (ref 11.1–15.9)
IMMATURE GRANS (ABS): 0 10*3/uL (ref 0.0–0.1)
Immature Granulocytes: 0 %
LYMPHS: 24 %
Lymphocytes Absolute: 1.1 10*3/uL (ref 0.7–3.1)
MCH: 27.7 pg (ref 26.6–33.0)
MCHC: 31.8 g/dL (ref 31.5–35.7)
MCV: 87 fL (ref 79–97)
MONOS ABS: 0.7 10*3/uL (ref 0.1–0.9)
Monocytes: 14 %
NEUTROS ABS: 2.7 10*3/uL (ref 1.4–7.0)
NEUTROS PCT: 59 %
PLATELETS: 251 10*3/uL (ref 150–379)
RBC: 4.52 x10E6/uL (ref 3.77–5.28)
RDW: 13.7 % (ref 12.3–15.4)
WBC: 4.7 10*3/uL (ref 3.4–10.8)

## 2017-01-25 LAB — BMP8+EGFR
BUN/Creatinine Ratio: 14 (ref 12–28)
BUN: 10 mg/dL (ref 8–27)
CALCIUM: 9.6 mg/dL (ref 8.7–10.3)
CHLORIDE: 103 mmol/L (ref 96–106)
CO2: 29 mmol/L (ref 20–29)
Creatinine, Ser: 0.73 mg/dL (ref 0.57–1.00)
GFR, EST AFRICAN AMERICAN: 84 mL/min/{1.73_m2} (ref >59)
GFR, EST NON AFRICAN AMERICAN: 73 mL/min/{1.73_m2} (ref >59)
Glucose: 105 mg/dL — ABNORMAL HIGH (ref 65–99)
POTASSIUM: 5 mmol/L (ref 3.5–5.2)
SODIUM: 144 mmol/L (ref 134–144)

## 2017-01-25 LAB — LIPID PANEL
CHOLESTEROL TOTAL: 155 mg/dL (ref 100–199)
Chol/HDL Ratio: 2.5 ratio (ref 0.0–4.4)
HDL: 63 mg/dL (ref >39)
LDL Calculated: 76 mg/dL (ref 0–99)
Triglycerides: 80 mg/dL (ref 0–149)
VLDL Cholesterol Cal: 16 mg/dL (ref 5–40)

## 2017-01-25 LAB — VITAMIN B12

## 2017-01-25 LAB — HEPATIC FUNCTION PANEL
ALBUMIN: 4.2 g/dL (ref 3.5–4.7)
ALK PHOS: 70 IU/L (ref 39–117)
ALT: 8 IU/L (ref 0–32)
AST: 24 IU/L (ref 0–40)
BILIRUBIN TOTAL: 0.4 mg/dL (ref 0.0–1.2)
BILIRUBIN, DIRECT: 0.16 mg/dL (ref 0.00–0.40)
TOTAL PROTEIN: 6.8 g/dL (ref 6.0–8.5)

## 2017-01-25 LAB — VITAMIN D 25 HYDROXY (VIT D DEFICIENCY, FRACTURES): VIT D 25 HYDROXY: 39.3 ng/mL (ref 30.0–100.0)

## 2017-01-25 LAB — THYROID PANEL WITH TSH
FREE THYROXINE INDEX: 2.7 (ref 1.2–4.9)
T3 Uptake Ratio: 27 % (ref 24–39)
T4, Total: 9.9 ug/dL (ref 4.5–12.0)
TSH: 0.615 u[IU]/mL (ref 0.450–4.500)

## 2017-01-28 ENCOUNTER — Other Ambulatory Visit: Payer: Self-pay | Admitting: Family Medicine

## 2017-02-03 ENCOUNTER — Ambulatory Visit (INDEPENDENT_AMBULATORY_CARE_PROVIDER_SITE_OTHER): Payer: Medicare Other | Admitting: Internal Medicine

## 2017-02-03 ENCOUNTER — Encounter: Payer: Self-pay | Admitting: Internal Medicine

## 2017-02-03 VITALS — BP 134/60 | HR 68 | Ht 59.0 in | Wt 113.4 lb

## 2017-02-03 DIAGNOSIS — J84112 Idiopathic pulmonary fibrosis: Secondary | ICD-10-CM

## 2017-02-03 DIAGNOSIS — R911 Solitary pulmonary nodule: Secondary | ICD-10-CM | POA: Diagnosis not present

## 2017-02-03 DIAGNOSIS — I2510 Atherosclerotic heart disease of native coronary artery without angina pectoris: Secondary | ICD-10-CM

## 2017-02-03 NOTE — Progress Notes (Signed)
Subjective:     Patient ID: [name], female   DOB: [birth year], 84 y.o.   MRN: [id number]  HPI    PCP Redge Gainer, MD   IOV 07/07/2015  Chief Complaint  Patient presents with  . Advice Only    Referred by Dr. Laurance Flatten for pulmonary nodule.       84 year old female accompanied by her daughter [name]. Referred for pulmonary nodule. According to the daughter and the patient will Sr. he is not fully clear with a long-standing history of pulmonary nodule that has been followed locally for many years review of the chart shows that she's had CT scan of the chest for many years. The CT scans of the chest. All the way back to 2004/2006 and described pulmonary fibrosis and nodule. I only have the images from 2014 that shows bilateral upper lobe pulmonary fibrosis with some involvement in the lower lobes. Non-UIP pattern. Most recently she had CT scan of the chest 06/30/2015. According to the daughter ration had some infectious symptoms at this time but again the CT was being done to evaluate for follow-up nodule and a new nodule was discovered which is described to be in the right upper lobe 6 mm and therefore has been referred here. Currently patient is feeling well baseline which is just mild intermittent occasional cough and shortness of breath with wheezing for which she is on maintenance inhaler possibly Brio which helps. Overall quality of life is fine and she does not wish for aggressive intervention.  I personally visualized the CT chest below. In terms of pulmonary fibrosis she is unaware of the diagnosis. Inour chart she's not had any autoimmune workup. She does live in an extremely old house over 85 years old. They think there might be some mold in it. She has chronic arthralgia but no diagnosed autoimmune disease   CT chest 06/30/15 EXAM: CT CHEST WITHOUT CONTRAST  TECHNIQUE: Multidetector CT imaging of the chest was performed following the standard protocol without IV  contrast.  COMPARISON: Chest radiograph 08/29/2015  FINDINGS: Mediastinum/Lymph Nodes: There are multiple borderline enlarged mediastinal lymph nodes, with reactive appearance. The heart is mildly enlarged. There is no pericardial effusion. Calcified atherosclerotic disease of the coronary arteries is seen.  Lungs/Pleura: There is no evidence of pneumothorax, pleural effusion or acute airspace consolidation. There are subpleural with upper lobe predominance fibrotic changes of the lungs bilaterally with coarsening of the interstitial markings, mild honeycombing and traction bronchiectasis. An area of scarring versus pulmonary nodule is seen in the subpleural right upper lobe and measures 6.4 mm (image 40/148, sequence 3). Mild pleural thickening is noted anteriorly at the level of the right middle lobe/ lingula.  Upper abdomen: No acute findings.  Musculoskeletal: No chest wall mass or suspicious bone lesions identified.  IMPRESSION: Upper lobe predominant chronic interstitial lung disease.  Multiple borderline enlarged mediastinal lymph nodes.  Differential diagnosis includes ankylosing spondylitis, sarcoidosis, silicosis, eosinophilic granuloma or prior granulomatous infection such as tuberculosis.  6.4 mm right upper lobe pulmonary nodule versus area of prominent scarring. Initial follow-up with CT at 6-12 months is recommended to confirm persistence. If persistent, repeat CT is recommended every 2 years until 5 years of stability has been established. This recommendation follows the consensus statement: Guidelines for Management of Incidental Pulmonary Nodules Detected on CT Images:From the Fleischner Society 2017; published online before print (10.1148/radiol.[id number]).   Electronically Signed  By: Fidela Salisbury M.D.  On: 06/30/2015 17:20  OV 02/15/2016  Chief Complaint  Patient presents  with  . Follow-up    Pt here after CT chest. Pt  states her breathing is unchanged since last OV. Pt denies cough and CP/tightness.    Follow-up 6 mm right upper lobe nodule  In follow-up interstitial lung disease not otherwise specified base in June 2017 CT chest  She had follow-up CT chest recommended below that at personally visualized with her and her daughter [name]. According to me the right upper lobe nodule is stable and I agree with the report. The interstitial lung disease pattern appears to be one of possible UIP. She has an elevated rheumatoid factor but negative CCP. Did tell me that they saw Dr. Trudie Reed rheumatologist and has been reassured that there is no systemic evidence of rheumatoid arthritis. I do not have the chart with me. Overall she's stable but does get dyspneic doing gardening and then relieved by rest and it is no chest pain. She wants to continue to be as physically active as possible she is not interested in pulmonary rehabilitation.  Of note I did review her CT chest in 2013 and 2017. Today I think ILD slowly progressive IMPRESSION: 1. Stable CT of the chest. 2. Similar appearance of chronic interstitial lung disease compatible with pulmonary fibrosis. This may be secondary to nonspecific interstitial pneumonia or usual interstitial pneumonia (UIP.) 3. Small pulmonary nodules are again noted and appears stable from 06/30/2015. Non-contrast chest CT can be considered at 12 months (from 07/01/2015) if patient is high-risk. This recommendation follows the consensus statement: Guidelines for Management of Incidental Pulmonary Nodules Detected on CT Images: From the Fleischner Society 2017; Radiology 2017; [phone]. 4.   Electronically Signed   By: Kerby Moors M.D.   On: 12/19/2015 14:54  OV 08/09/2016  Chief Complaint  Patient presents with  . Follow-up    Pt states her breathing is unchanged sicne last OV. Pt states she has a dry cough. Pt denies CP/tightness and f/c/s.    Follow-up idiopathic  pulmonary fibrosis diagnosis based based on age, possible UIP on CT scan and negative rheumatology evaluation. Diagnosis given 02/15/2016  Follow-up lung nodule  Overall she's doing stable. Today in the office she walked coronary 5 feet 3 laps on room air: She was unable to do  and then stopped because of hip pain and knee pain is chronic issues. She continues to live alone and her daughter [name] is with her today. She is functional in the house able to gardening but then stopped because of dyspnea. She does have some wheeze for which she takes Brio although there is no emphysema on the CT scan.   OV 10/03/2016    Chief Complaint  Patient presents with  . Follow-up    Pt attempted to do the PFT but was unable to complete. Denies any comlaints or concerns. Occ. SOB. Denies any CP or cough.    Follow-up IPF on basic supportive care. Last saw her in June 2018. At this point now she is stable. We will gardening. She complains of dyspnea on exertion relieved by rest it is mild to moderate. It is not any worse. She could not do her PFTs today. In the past walked this also failed. I told her that we will just follow her symptoms. She is but is pitting in the IPF registry by BI . Daugher [name] with her  follow-up lung nodule: She has her next scans December 18   OV 02/03/2017   Chief Complaint  Patient presents with  . Follow-up  CT scan done 12/16/16.     Pt states she has been doing good since last visit. Breathing is the same as it was at last visit. Pt still losing weight.    Follow-up idiopathic interstitial ILD: Clinical diagnosis of IPF given based on age and also presence o of traction bronchiectasis and also negative autoimmune profile.  Overall clinically fine.  She is doing gardening and cooking and doing activities of daily living without any chest pain or shortness of breath.  Her CT chest in dec 2018 shows prgression since 2012. However, asymptomatic and Walking desaturation  test on 02/03/2017 185 feet x 3 laps on ROOM AIR:  did NOT desaturate. Rest pulse ox was 100%, final pulse ox was 100%. HR response 74/min at rest to 83/min at peak exertion. Patient SAMAURI KELLENBERGER  Did not Desaturate < 88% . Vaughan Basta did not  Desaturated </= 3% points. Vaughan Basta did not get tachyardic. OF note, she is in IPF Registry and she did  A visit with [facility] with blood test and questionnaire. She still is interested in continuing that   Lung nodule 63mm sable 2012 -> 2018   Ne finding  does have coronary artery calcification on CT - no chest pain. Prefers expectant followup   CT [facility] dec 2018 Lungs/Pleura: Extensive subpleural reticulation, interstitial coarsening and traction bronchiectasis/bronchiolectasis without a definite zonal predominance. Findings are progressive from 07/11/2010. 5 mm subpleural right lower lobe nodule (image 93), unchanged from 2012 and considered benign. No pleural fluid. Airway is unremarkable.  Upper Abdomen: Visualized portions of the liver, adrenal glands, kidneys, spleen, pancreas, stomach and bowel are grossly unremarkable.  Musculoskeletal: Degenerative changes in the spine. No worrisome lytic or sclerotic lesions.  IMPRESSION: 1. Fibrotic interstitial lung disease has progressed from 07/11/2010 and may be due to fibrotic nonspecific interstitial pneumonitis. Distribution is considered somewhat non typical for usual interstitial pneumonitis given areas of more central interstitial involvement as well as lack of a strong craniocaudal gradient, but is not excluded. 2. Aortic atherosclerosis (ICD10-170.0). Severe coronary artery calcification. 3. Enlarged pulmonary arteries, indicative of pulmonary arterial hypertension.   Electronically Signed   By: Lorin Picket M.D.   On: 12/16/2016 12:18   has a past medical history of Arthritis, Backache, unspecified, Cataract, Glaucoma, Lumbar stenosis, Osteoarthrosis and allied  disorders, Osteoporosis, Other and unspecified hyperlipidemia, Peripheral neuropathy, and Vitamin D deficiency.   reports that  has never smoked. she has never used smokeless tobacco.  Past Surgical History:  Procedure Laterality Date  . ABDOMINAL HYSTERECTOMY    . CATARACT EXTRACTION    . EYE SURGERY Bilateral    cataract  . EYELID CLOSURE     eyelid surgery "lift"  . KNEE ARTHROSCOPY    . LUMBAR LAMINECTOMY      Allergies  Allergen Reactions  . Levaquin [Levaquin Leva-Pak]     Patient cannot remember that she is sensitive to this medication; has never heard of it.  . Mucinex [Guaifenesin Er]     Causes anxiety  . Erythromycin Diarrhea  . Penicillins Rash    Immunization History  Administered Date(s) Administered  . Influenza Split 10/28/2012  . Influenza Whole 09/14/2009  . Influenza, High Dose Seasonal PF 10/07/2016  . Influenza, Quadrivalent, Recombinant, Inj, Pf 11/08/2015  . Influenza,inj,Quad PF,6+ Mos 10/24/2014  . Influenza,inj,quad, With Preservative 11/10/2013  . Pneumococcal Conjugate-13 12/31/2012  . Pneumococcal Polysaccharide-23 10/14/1996  . Td 10/15/2002  . Zoster 02/05/2010    Family History  Problem Relation  Age of Onset  . Cancer Mother        bone  . Cancer Sister        breast  . Diabetes Brother   . Asthma Daughter   . COPD Son   . Asthma Son      Current Outpatient Medications:  .  alendronate (FOSAMAX) 70 MG tablet, TAKE 1 TABLET WEEKLY (TAKE WITH 8OZ OF WATER 30 MINUTES BEFORE BREAKFAST), Disp: 12 tablet, Rfl: 2 .  BREO ELLIPTA 100-25 MCG/INH AEPB, USE 1 INHALATION DAILY, Disp: 60 each, Rfl: 2 .  calcium carbonate (OS-CAL) 600 MG TABS tablet, Take 600 mg by mouth daily with breakfast., Disp: , Rfl:  .  cephALEXin (KEFLEX) 500 MG capsule, Take 1 capsule by mouth 3 (three) times daily. X 1 week - then 1 nightly (dr Jeffie Pollock), Disp: , Rfl:  .  cholecalciferol (VITAMIN D) 1000 UNITS tablet, Take 1,000 Units by mouth daily., Disp: , Rfl:  .   fluticasone (FLONASE) 50 MCG/ACT nasal spray, USE 2 SPRAYS IN EACH NOSTRIL DAILY, Disp: 16 g, Rfl: 4 .  ibuprofen (ADVIL,MOTRIN) 200 MG tablet, Take 200 mg by mouth every 6 (six) hours as needed., Disp: , Rfl:  .  Multiple Vitamins-Minerals (CENTRUM SILVER) tablet, Take 1 tablet by mouth every evening., Disp: , Rfl:  .  ranitidine (ZANTAC) 150 MG tablet, Take 150 mg by mouth 2 (two) times daily., Disp: , Rfl:  .  simvastatin (ZOCOR) 80 MG tablet, TAKE ONE TABLET DAILY AT BEDTIME, Disp: 90 tablet, Rfl: 1  Current Facility-Administered Medications:  .  cyanocobalamin ((VITAMIN B-12)) injection 1,000 mcg, 1,000 mcg, Intramuscular, Q30 days, Chipper Herb, MD, 1,000 mcg at 01/24/17 1427   Review of Systems     Objective:   Physical Exam  Constitutional: She is oriented to person, place, and time. No distress.  frail  HENT:  Head: Normocephalic and atraumatic.  Right Ear: External ear normal.  Left Ear: External ear normal.  Mouth/Throat: Oropharynx is clear and moist. No oropharyngeal exudate.  Eyes: Conjunctivae and EOM are normal. Pupils are equal, round, and reactive to light. Right eye exhibits no discharge. Left eye exhibits no discharge. No scleral icterus.  Neck: Normal range of motion. Neck supple. No JVD present. No tracheal deviation present. No thyromegaly present.  Cardiovascular: Normal rate, regular rhythm, normal heart sounds and intact distal pulses. Exam reveals no gallop and no friction rub.  No murmur heard. Pulmonary/Chest: Effort normal and breath sounds normal. No respiratory distress. She has no wheezes. She has no rales. She exhibits no tenderness.  Mild basal crackles  Abdominal: Soft. Bowel sounds are normal. She exhibits no distension and no mass. There is no tenderness. There is no rebound and no guarding.  Musculoskeletal: Normal range of motion. She exhibits no edema or tenderness.  Lymphadenopathy:    She has no cervical adenopathy.  Neurological: She is  alert and oriented to person, place, and time. She has normal reflexes. No cranial nerve deficit. She exhibits normal muscle tone. Coordination normal.  Skin: Skin is warm and dry. No rash noted. She is not diaphoretic. No erythema. No pallor.  Psychiatric: She has a normal mood and affect. Her behavior is normal. Judgment and thought content normal.  Vitals reviewed.  Vitals:   02/03/17 0936  BP: 134/60  Pulse: 68  SpO2: 97%  Weight: 113 lb 6.4 oz (51.4 kg)  Height: 4\' 11"  (1.499 m)    Estimated body mass index is 22.9 kg/m as calculated from  the following:   Height as of this encounter: 4\' 11"  (1.499 m).   Weight as of this encounter: 113 lb 6.4 oz (51.4 kg).     Assessment:       ICD-10-CM   1. IPF (idiopathic pulmonary fibrosis) ([place]) J84.112   2. Nodule of right lung R91.1   3. Coronary artery calcification seen on CAT scan I25.10        Plan:       Lung disease is slightly worse since 2012 but is not bothering you Nodule right lung is stable since 2012 If there is no chest pain, you might not need  Cardiac stress test  - we can monitor this   Plan Continue observational supportive care for IPF No more CT scan needed  Followup - meet [facility] to go do blood work for IPF registry and give him completed questionnaire  - REturn in 6-8 months for expectted followup   > 50% of this > 25 min visit spent in face to face counseling or coordination of care    Dr. Brand Males, M.D., [facility].C.P Pulmonary and Critical Care Medicine Staff Physician, [place] Director - Interstitial Lung Disease  Program  [facility] [place], [name], [address]  Pager: [phone], If no answer or between  15:00h - 7:00h: call [phone] Telephone: [phone]

## 2017-02-03 NOTE — Patient Instructions (Addendum)
ICD-10-CM   1. IPF (idiopathic pulmonary fibrosis) ([place]) J84.112   2. Nodule of right lung R91.1   3. Coronary artery calcification seen on CAT scan I25.10      Lung disease is slightly worse since 2012 but is not bothering you Nodule right lung is stable since 2012 If there is no chest pain, you might not need  Cardiac stress test  - we can monitor this   Plan Continue observational supportive care for IPF No more CT scan needed  Followup - meet Cam Mosley to go do blood work for IPF registry and give him completed questionnaire  - REturn in 6-8 months for expectted followup

## 2017-02-04 NOTE — Progress Notes (Signed)
IPF PRO Registry Purpose: To collect data and biological samples that will support future research studies. Registry will describe current approaches to diagnosis and treatment of IPF, analyze participant characteristics to describe the natural history of the disease, assess the quality of life, describe participant interactions with the health care system, describe IPF treatment practices across multiple institutions, and utilize biological samples linked to well characterized IPF participants to identify disease biomarkers.  Clinical Research Coordinator note : This visit for Subject [name] with DOB: [birth year] on 02/03/2017 for the above protocol is the 12 month follow up visit  and is for purpose of Research. The consent for this encounter is under Protocol Version Amendment 2 and  IS currently IRB approved. The subject expressed continued interest and consent in continuing as a study subject. Subject confirmed that there was  no change in contact information (e.g. address, telephone, email). Subject thanked for participation in research and contribution to science.   In this visit 02/03/2017 the subject will be evaluated by investigator named Dr. Chase Caller. This research coordinator has verified that the investigator is up to date with his/her training logs  Additional details (CRC to choose 1 and delete the other) 1. Because this visit is a key visit of enrollment  this visit is under direct supervision of the PI Dr.Ramaswamy . This PI is available for this visit  Signed by T. Early Chars BS,   Clinical Research Coordinator I [place], [name] 9:04 AM 02/04/2017

## 2017-02-25 ENCOUNTER — Ambulatory Visit (INDEPENDENT_AMBULATORY_CARE_PROVIDER_SITE_OTHER): Payer: Medicare Other | Admitting: *Deleted

## 2017-02-25 DIAGNOSIS — E559 Vitamin D deficiency, unspecified: Secondary | ICD-10-CM

## 2017-02-25 DIAGNOSIS — E538 Deficiency of other specified B group vitamins: Secondary | ICD-10-CM | POA: Diagnosis not present

## 2017-03-12 DIAGNOSIS — M25512 Pain in left shoulder: Secondary | ICD-10-CM | POA: Diagnosis not present

## 2017-03-12 DIAGNOSIS — Z7951 Long term (current) use of inhaled steroids: Secondary | ICD-10-CM | POA: Diagnosis not present

## 2017-03-12 DIAGNOSIS — Z79899 Other long term (current) drug therapy: Secondary | ICD-10-CM | POA: Diagnosis not present

## 2017-03-12 DIAGNOSIS — M810 Age-related osteoporosis without current pathological fracture: Secondary | ICD-10-CM | POA: Diagnosis not present

## 2017-03-12 DIAGNOSIS — E7800 Pure hypercholesterolemia, unspecified: Secondary | ICD-10-CM | POA: Diagnosis not present

## 2017-03-12 DIAGNOSIS — J8410 Pulmonary fibrosis, unspecified: Secondary | ICD-10-CM | POA: Diagnosis not present

## 2017-03-12 DIAGNOSIS — R079 Chest pain, unspecified: Secondary | ICD-10-CM | POA: Diagnosis not present

## 2017-03-12 DIAGNOSIS — R0789 Other chest pain: Secondary | ICD-10-CM | POA: Diagnosis not present

## 2017-03-12 DIAGNOSIS — I447 Left bundle-branch block, unspecified: Secondary | ICD-10-CM | POA: Diagnosis not present

## 2017-03-12 DIAGNOSIS — R0600 Dyspnea, unspecified: Secondary | ICD-10-CM | POA: Diagnosis not present

## 2017-03-21 ENCOUNTER — Ambulatory Visit (INDEPENDENT_AMBULATORY_CARE_PROVIDER_SITE_OTHER): Payer: Medicare Other | Admitting: Urology

## 2017-03-21 ENCOUNTER — Other Ambulatory Visit (HOSPITAL_COMMUNITY)
Admission: RE | Admit: 2017-03-21 | Discharge: 2017-03-21 | Disposition: A | Discharge status: Home / Self Care | Payer: Medicare Other | Source: Other Acute Inpatient Hospital | Attending: Urology | Admitting: Urology

## 2017-03-21 DIAGNOSIS — N3946 Mixed incontinence: Secondary | ICD-10-CM | POA: Diagnosis not present

## 2017-03-21 DIAGNOSIS — N3020 Other chronic cystitis without hematuria: Secondary | ICD-10-CM | POA: Diagnosis not present

## 2017-03-22 LAB — URINE CULTURE: Culture: NO GROWTH

## 2017-03-24 ENCOUNTER — Ambulatory Visit (INDEPENDENT_AMBULATORY_CARE_PROVIDER_SITE_OTHER): Payer: Medicare Other | Admitting: Adult Health

## 2017-03-24 ENCOUNTER — Encounter: Payer: Self-pay | Admitting: Adult Health

## 2017-03-24 DIAGNOSIS — J84112 Idiopathic pulmonary fibrosis: Secondary | ICD-10-CM | POA: Diagnosis not present

## 2017-03-24 DIAGNOSIS — R0789 Other chest pain: Secondary | ICD-10-CM | POA: Diagnosis not present

## 2017-03-24 DIAGNOSIS — R634 Abnormal weight loss: Secondary | ICD-10-CM

## 2017-03-24 DIAGNOSIS — I2510 Atherosclerotic heart disease of native coronary artery without angina pectoris: Secondary | ICD-10-CM | POA: Diagnosis not present

## 2017-03-24 NOTE — Addendum Note (Signed)
Addended by: Parke Poisson E on: 03/24/2017 11:21 AM   Modules accepted: Orders

## 2017-03-24 NOTE — Assessment & Plan Note (Signed)
Appears stable without flare No changes

## 2017-03-24 NOTE — Progress Notes (Signed)
@Patient  ID: [name], female    DOB: 06-20-1925, 84 y.o.   MRN: [id number]  Chief Complaint  Patient presents with  . Follow-up    IPF    Referring provider: Chipper Herb, MD  HPI: 84 year old female former smoker followed for IPF   CT [facility] dec 2018 Lungs/Pleura: Extensive subpleural reticulation, interstitial coarsening and traction bronchiectasis/bronchiolectasis without a definite zonal predominance. Findings are progressive from 07/11/2010. 5 mm subpleural right lower lobe nodule (image 93), unchanged from 2012 and considered benign. No pleural fluid. Airway is unremarkable.  Upper Abdomen: Visualized portions of the liver, adrenal glands, kidneys, spleen, pancreas, stomach and bowel are grossly unremarkable.  Musculoskeletal: Degenerative changes in the spine. No worrisome lytic or sclerotic lesions.  IMPRESSION: 1. Fibrotic interstitial lung disease has progressed from 07/11/2010 and may be due to fibrotic nonspecific interstitial pneumonitis. Distribution is considered somewhat non typical for usual interstitial pneumonitis given areas of more central interstitial involvement as well as lack of a strong craniocaudal gradient, but is not excluded. 2. Aortic atherosclerosis (ICD10-170.0). Severe coronary artery calcification. 3. Enlarged pulmonary arteries, indicative of pulmonary arterial Hypertension.   03/24/2017 Follow up : IPF , ER follow up  Patient presents for a follow-up visit.  She was recently seen in the emergency room at [facility]. 2 weeks ago.  She had increased shortness of breath and chest pain. Says ER workup was reported neg for cardiac. CT chest showed no PE , stable chronic changes.  EKG was reported as Left BBB.  No further episodes.  No FH of CAD. No HTN . + hyperlipidemia on statin .    Patient has underlying IPF, clinical diagnosis, has been essentially stable.  On supportive care.  Most recent CT chest in December  2018 show some progression since 2012 but overall felt to be clinically stable. Remains active , does all housework and gardening . Does not drive.(has never driven)   Continues to have weight loss. On ensure. Says PCP has worked up but has not found any reason.     Allergies  Allergen Reactions  . Levaquin [Levaquin Leva-Pak]     Patient cannot remember that she is sensitive to this medication; has never heard of it.  . Mucinex [Guaifenesin Er]     Causes anxiety  . Erythromycin Diarrhea  . Penicillins Rash    Immunization History  Administered Date(s) Administered  . Influenza Split 10/28/2012  . Influenza Whole 09/14/2009  . Influenza, High Dose Seasonal PF 10/07/2016  . Influenza, Quadrivalent, Recombinant, Inj, Pf 11/08/2015  . Influenza,inj,Quad PF,6+ Mos 10/24/2014  . Influenza,inj,quad, With Preservative 11/10/2013  . Pneumococcal Conjugate-13 12/31/2012  . Pneumococcal Polysaccharide-23 10/14/1996  . Td 10/15/2002  . Zoster 02/05/2010    Past Medical History:  Diagnosis Date  . Arthritis   . Backache, unspecified   . Cataract   . Glaucoma   . Lumbar stenosis   . Osteoarthrosis and allied disorders   . Osteoporosis   . Other and unspecified hyperlipidemia   . Peripheral neuropathy   . Vitamin D deficiency     Tobacco History: Social History   Tobacco Use  Smoking Status Never Smoker  Smokeless Tobacco Never Used   Counseling given: Not Answered   Outpatient Encounter Medications as of 03/24/2017  Medication Sig  . albuterol (VENTOLIN HFA) 108 (90 Base) MCG/ACT inhaler 2 puffs every 4-6 hours as needed for wheezing, shortness of breath  . alendronate (FOSAMAX) 70 MG tablet TAKE 1 TABLET WEEKLY (TAKE WITH  8OZ OF WATER 30 MINUTES BEFORE BREAKFAST)  . BREO ELLIPTA 100-25 MCG/INH AEPB USE 1 INHALATION DAILY  . calcium carbonate (OS-CAL) 600 MG TABS tablet Take 600 mg by mouth daily with breakfast.  . cephALEXin (KEFLEX) 500 MG capsule Take 1 capsule by  mouth 3 (three) times daily. X 1 week - then 1 nightly (dr Jeffie Pollock)  . cholecalciferol (VITAMIN D) 1000 UNITS tablet Take 1,000 Units by mouth daily.  . fluticasone (FLONASE) 50 MCG/ACT nasal spray USE 2 SPRAYS IN EACH NOSTRIL DAILY  . ibuprofen (ADVIL,MOTRIN) 200 MG tablet Take 200 mg by mouth every 6 (six) hours as needed.  . Multiple Vitamins-Minerals (CENTRUM SILVER) tablet Take 1 tablet by mouth every evening.  . ranitidine (ZANTAC) 150 MG tablet Take 150 mg by mouth 2 (two) times daily.  . simvastatin (ZOCOR) 80 MG tablet TAKE ONE TABLET DAILY AT BEDTIME   Facility-Administered Encounter Medications as of 03/24/2017  Medication  . cyanocobalamin ((VITAMIN B-12)) injection 1,000 mcg     Review of Systems  Constitutional:   No  weight loss, night sweats,  Fevers, chills,  +fatigue, or  lassitude.  HEENT:   No headaches,  Difficulty swallowing,  Tooth/dental problems, or  Sore throat,                No sneezing, itching, ear ache, nasal congestion, post nasal drip,   CV:  No chest pain,  Orthopnea, PND, swelling in lower extremities, anasarca, dizziness, palpitations, syncope.   GI  No heartburn, indigestion, abdominal pain, nausea, vomiting, diarrhea, change in bowel habits, loss of appetite, bloody stools.   Resp:  No chest wall deformity  Skin: no rash or lesions.  GU: no dysuria, change in color of urine, no urgency or frequency.  No flank pain, no hematuria   MS:  No joint pain or swelling.  No decreased range of motion.  No back pain.    Physical Exam  BP 112/70 (BP Location: Right Arm, Cuff Size: Normal)   Pulse 70   Ht 4\' 11"  (1.499 m)   Wt 106 lb 9.6 oz (48.4 kg)   SpO2 97%   BMI 21.53 kg/m   GEN: A/Ox3; pleasant , NAD, thin and frail, elderly   HEENT:  [place]/AT,  EACs-clear, TMs-wnl, NOSE-clear, THROAT-clear, no lesions, no postnasal drip or exudate noted.   NECK:  Supple w/ fair ROM; no JVD; normal carotid impulses w/o bruits; no thyromegaly or nodules  palpated; no lymphadenopathy.    RESP bibasilar crackles, no accessory muscle use, no dullness to percussion  CARD:  RRR, no m/r/g, no peripheral edema, pulses intact, no cyanosis or clubbing.  GI:   Soft & nt; nml bowel sounds; no organomegaly or masses detected.   Musco: Warm bil, no deformities or joint swelling noted.   Neuro: alert, no focal deficits noted.    Skin: Warm, no lesions or rashes    Lab Results:  CBC    Component Value Date/Time   WBC 4.7 01/24/2017 1518   WBC 11.1 (A) 06/09/2014 1021   WBC  05/24/2008 0545    4.2 WHITE COUNT CONFIRMED ON SMEAR CORRECTED ON 05/11 AT 0755: PREVIOUSLY REPORTED AS 4.2   RBC 4.52 01/24/2017 1518   RBC 4.63 06/09/2014 1021   RBC 4.11 05/24/2008 0545   HGB 12.5 01/24/2017 1518   HCT 39.3 01/24/2017 1518   PLT 251 01/24/2017 1518   MCV 87 01/24/2017 1518   MCH 27.7 01/24/2017 1518   MCH 27.8 06/09/2014 1021  MCHC 31.8 01/24/2017 1518   MCHC 32.3 06/09/2014 1021   MCHC 34.8 05/24/2008 0545   RDW 13.7 01/24/2017 1518   LYMPHSABS 1.1 01/24/2017 1518   MONOABS 0.3 05/24/2008 0545   EOSABS 0.1 01/24/2017 1518   BASOSABS 0.0 01/24/2017 1518    BMET    Component Value Date/Time   NA 144 01/24/2017 1518   K 5.0 01/24/2017 1518   CL 103 01/24/2017 1518   CO2 29 01/24/2017 1518   GLUCOSE 105 (H) 01/24/2017 1518   GLUCOSE 87 07/16/2012 1151   BUN 10 01/24/2017 1518   CREATININE 0.73 01/24/2017 1518   CREATININE 0.67 07/16/2012 1151   CALCIUM 9.6 01/24/2017 1518   GFRNONAA 73 01/24/2017 1518   GFRNONAA 80 07/16/2012 1151   GFRAA 84 01/24/2017 1518   GFRAA >89 07/16/2012 1151    BNP No results found for: BNP  ProBNP No results found for: PROBNP  Imaging: No results found.   Assessment & Plan:   IPF (idiopathic pulmonary fibrosis) (HCC) Appears stable without flare No changes  Atypical chest pain Atypical chest pain 2 weeks ago.  Emergency room workup reviewed with no acute findings Patient does have  some risk factors with hyperlipidemia and age.  Daughter and patient would like referral to cardiology for evaluation  Weight loss Ongoing weight loss questionable etiology.  Patient has been following with primary care physician with extensive lab workup unrevealing.  Patient is advised to continue on Ensure and follow back up with primary care if weight continues to decrease     Rexene Edison, NP 03/24/2017

## 2017-03-24 NOTE — Patient Instructions (Addendum)
Continue on current regimen  Refer to cardiology .  Continue with ensure between meals.  Follow up with Dr. Chase Caller 4 months and As needed   Please contact office for sooner follow up if symptoms do not improve or worsen or seek emergency care

## 2017-03-24 NOTE — Assessment & Plan Note (Signed)
Atypical chest pain 2 weeks ago.  Emergency room workup reviewed with no acute findings Patient does have some risk factors with hyperlipidemia and age.  Daughter and patient would like referral to cardiology for evaluation

## 2017-03-24 NOTE — Assessment & Plan Note (Signed)
Ongoing weight loss questionable etiology.  Patient has been following with primary care physician with extensive lab workup unrevealing.  Patient is advised to continue on Ensure and follow back up with primary care if weight continues to decrease

## 2017-03-26 ENCOUNTER — Ambulatory Visit (INDEPENDENT_AMBULATORY_CARE_PROVIDER_SITE_OTHER): Payer: Medicare Other | Admitting: *Deleted

## 2017-03-26 DIAGNOSIS — E538 Deficiency of other specified B group vitamins: Secondary | ICD-10-CM | POA: Diagnosis not present

## 2017-03-26 NOTE — Progress Notes (Signed)
Pt given Cyanocobalamin inj Tolerated well 

## 2017-04-09 ENCOUNTER — Encounter: Payer: Self-pay | Admitting: Cardiology

## 2017-04-09 NOTE — Progress Notes (Signed)
Cardiology Office Note  Date: 04/10/2017   ID: [name], DOB 03/16/1925, MRN [id number]  PCP: Chipper Herb, MD  Consulting Cardiologist: [name], MD   Chief Complaint  Patient presents with  . Chest Pain    History of Present Illness: [name] is an 84 y.o. female referred for cardiology consultation by Ms. Parent NP for evaluation of chest pain.  I reviewed available records which are incomplete at this time.  She is here today with her daughter.  She tells me that back in late February she developed an episode of intense chest pain with radiation to her upper arms and shoulders, occurred at rest and was associated with shortness of breath.  She "walked to the floor" hoping that the symptoms would improve, but they did not.  She was ultimately taken to [facility] ER via EMS for further assessment.  My understanding is that she had a left bundle branch block noted by ECG although her troponin levels were normal, and symptoms resolved.  Since that time she has had no further episodes of chest pain.  Patient is followed in the Pulmonary division with pulmonary fibrosis.  This is being managed conservatively. Chest CT in December 2018 done in follow-up of of her chronic lung disease reported incidentally noted severe coronary artery calcifications.  Patient is frail and states that she has been losing weight, does use Ensure.  She tries to remain active with basic ADLs.  Still lives at home.  Past Medical History:  Diagnosis Date  . Arthritis   . Cataract   . Chronic back pain   . Glaucoma   . Hyperlipidemia   . Idiopathic pulmonary fibrosis ([place])   . Lumbar stenosis   . Osteoarthrosis and allied disorders   . Osteoporosis   . Peripheral neuropathy   . Vitamin D deficiency     Past Surgical History:  Procedure Laterality Date  . ABDOMINAL HYSTERECTOMY    . CATARACT EXTRACTION    . Cataract surgery Bilateral   . Eyelid surgery    . KNEE  ARTHROSCOPY    . LUMBAR LAMINECTOMY      Current Outpatient Medications  Medication Sig Dispense Refill  . albuterol (VENTOLIN HFA) 108 (90 Base) MCG/ACT inhaler 2 puffs every 4-6 hours as needed for wheezing, shortness of breath    . alendronate (FOSAMAX) 70 MG tablet TAKE 1 TABLET WEEKLY (TAKE WITH 8OZ OF WATER 30 MINUTES BEFORE BREAKFAST) 12 tablet 2  . BREO ELLIPTA 100-25 MCG/INH AEPB USE 1 INHALATION DAILY 60 each 2  . calcium carbonate (OS-CAL) 600 MG TABS tablet Take 600 mg by mouth daily with breakfast.    . cephALEXin (KEFLEX) 500 MG capsule Take 1 capsule by mouth 3 (three) times daily. X 1 week - then 1 nightly (dr Jeffie Pollock)    . cholecalciferol (VITAMIN D) 1000 UNITS tablet Take 1,000 Units by mouth daily.    . fluticasone (FLONASE) 50 MCG/ACT nasal spray USE 2 SPRAYS IN EACH NOSTRIL DAILY 16 g 4  . ibuprofen (ADVIL,MOTRIN) 200 MG tablet Take 200 mg by mouth every 6 (six) hours as needed.    . Multiple Vitamins-Minerals (CENTRUM SILVER) tablet Take 1 tablet by mouth every evening.    . ranitidine (ZANTAC) 150 MG tablet Take 150 mg by mouth 2 (two) times daily.    . simvastatin (ZOCOR) 80 MG tablet Take 40 mg by mouth daily.    Marland Kitchen aspirin EC 81 MG tablet Take 1  tablet (81 mg total) by mouth daily. 90 tablet 3  . nitroGLYCERIN (NITROSTAT) 0.4 MG SL tablet Place 1 tablet (0.4 mg total) under the tongue every 5 (five) minutes as needed for chest pain. 25 tablet 3   Current Facility-Administered Medications  Medication Dose Route Frequency Provider Last Rate Last Dose  . cyanocobalamin ((VITAMIN B-12)) injection 1,000 mcg  1,000 mcg Intramuscular Q30 days Chipper Herb, MD   1,000 mcg at 03/26/17 1042   Allergies:  Levaquin [levaquin leva-pak]; Mucinex [guaifenesin er]; Erythromycin; and Penicillins   Social History: The patient  reports that she has never smoked. She has never used smokeless tobacco. She reports that she does not drink alcohol or use drugs.   Family History: The  patient's family history includes Asthma in her daughter and son; Bone cancer in her mother; Breast cancer in her sister; COPD in her son; Diabetes in her brother.   ROS:  Please see the history of present illness. Otherwise, complete review of systems is positive for arthritic stiffness.  All other systems are reviewed and negative.   Physical Exam: VS:  BP 125/71   Pulse 75   Ht 4\' 10"  (1.473 m)   Wt 108 lb 12.8 oz (49.4 kg)   SpO2 97%   BMI 22.74 kg/m , BMI Body mass index is 22.74 kg/m.  Wt Readings from Last 3 Encounters:  04/10/17 108 lb 12.8 oz (49.4 kg)  03/24/17 106 lb 9.6 oz (48.4 kg)  02/03/17 113 lb 6.4 oz (51.4 kg)    General: Frail-appearing elderly woman in no distress. HEENT: Conjunctiva and lids normal, oropharynx clear. Neck: Supple, no elevated JVP or carotid bruits, no thyromegaly. Lungs: Coarse but clear regressed sounds, nonlabored breathing at rest. Cardiac: Regular rate and rhythm, no S3, soft systolic murmur, no pericardial rub. Abdomen: Soft, nontender, bowel sounds present. Extremities: No pitting edema, distal pulses 2+. Skin: Warm and dry. Musculoskeletal: Kyphosis noted. Neuropsychiatric: Alert and oriented x3, affect grossly appropriate.  ECG: There is no old tracing available for comparison today.  Recent Labwork: 01/24/2017: ALT 8; AST 24; BUN 10; Creatinine, Ser 0.73; Hemoglobin 12.5; Platelets 251; Potassium 5.0; Sodium 144; TSH 0.615     Component Value Date/Time   CHOL 155 01/24/2017 1518   CHOL 158 07/16/2012 1151   TRIG 80 01/24/2017 1518   TRIG 105 06/22/2014 1121   TRIG 134 07/16/2012 1151   HDL 63 01/24/2017 1518   HDL 66 06/22/2014 1121   HDL 57 07/16/2012 1151   CHOLHDL 2.5 01/24/2017 1518   LDLCALC 76 01/24/2017 1518   LDLCALC 76 07/02/2013 1115   LDLCALC 74 07/16/2012 1151    Other Studies Reviewed Today:  Chest CT 12/16/2016: IMPRESSION: 1. Fibrotic interstitial lung disease has progressed from 07/11/2010 and may be  due to fibrotic nonspecific interstitial pneumonitis. Distribution is considered somewhat non typical for usual interstitial pneumonitis given areas of more central interstitial involvement as well as lack of a strong craniocaudal gradient, but is not excluded. 2. Aortic atherosclerosis (ICD10-170.0). Severe coronary artery calcification. 3. Enlarged pulmonary arteries, indicative of pulmonary arterial hypertension.  Assessment and Plan:  1.  Episode of chest pain and shortness of breath at rest back in late February without recurrence.  We are requesting records from her ER visit at [facility] to get more objective information about that particular encounter.  This could have been an episode of angina as she does have evidence of coronary atherosclerosis by chest CT imaging.  I talked  with the patient and her daughter today about possible approaches including medical therapy and observation versus pursuing additional ischemic testing, both noninvasive and invasive techniques that are available.  At this point she was most comfortable with medical therapy which I think is very reasonable.  She will start aspirin 81 mg daily and we are providing a prescription for as needed nitroglycerin.  She is already on statin medication.  We will arrange a follow-up to see how she is doing.  2.  Idiopathic pulmonary fibrosis, followed by Dr. Chase Caller.  3.  Hyperlipidemia, on Zocor.  She follows with Dr. Laurance Flatten.  Recent LDL 76.  Current medicines were reviewed with the patient today.  Disposition: Follow-up in 6 months.  Signed, Satira Sark, MD, [facility] 04/10/2017 3:21 PM    [facility] [place], [place], [place] [address] Phone: [phone]; Fax: [phone]

## 2017-04-10 ENCOUNTER — Ambulatory Visit (INDEPENDENT_AMBULATORY_CARE_PROVIDER_SITE_OTHER): Payer: Medicare Other | Admitting: Cardiology

## 2017-04-10 ENCOUNTER — Encounter: Payer: Self-pay | Admitting: Cardiology

## 2017-04-10 VITALS — BP 125/71 | HR 75 | Ht <= 58 in | Wt 108.8 lb

## 2017-04-10 DIAGNOSIS — I2510 Atherosclerotic heart disease of native coronary artery without angina pectoris: Secondary | ICD-10-CM | POA: Diagnosis not present

## 2017-04-10 DIAGNOSIS — R072 Precordial pain: Secondary | ICD-10-CM | POA: Diagnosis not present

## 2017-04-10 DIAGNOSIS — E782 Mixed hyperlipidemia: Secondary | ICD-10-CM | POA: Diagnosis not present

## 2017-04-10 DIAGNOSIS — J84112 Idiopathic pulmonary fibrosis: Secondary | ICD-10-CM | POA: Diagnosis not present

## 2017-04-10 MED ORDER — NITROGLYCERIN 0.4 MG SL SUBL: 0.4000 mg | SUBLINGUAL_TABLET | SUBLINGUAL | 3 refills | Status: DC | PRN

## 2017-04-10 MED ORDER — ASPIRIN EC 81 MG PO TBEC: 81.0000 mg | DELAYED_RELEASE_TABLET | Freq: Every day | ORAL | 3 refills | Status: AC

## 2017-04-10 NOTE — Patient Instructions (Signed)
Medication Instructions:  Your physician has recommended you make the following change in your medication:    START Asprin 81 mg daily   Please continue all other medications as prescribed  Labwork: NONE  Testing/Procedures: NONE  Follow-Up: Your physician wants you to follow-up in: [place]. You will receive a reminder letter in the mail two months in advance. If you don't receive a letter, please call our office to schedule the follow-up appointment.  Any Other Special Instructions Will Be Listed Below (If Applicable).  If you need a refill on your cardiac medications before your next appointment, please call your pharmacy.

## 2017-04-11 NOTE — Progress Notes (Signed)
Records reviewed from [facility] regarding ER visit on February 27 with chest pain.  Lab work revealed negative troponin T levels less than 0.01 x 2. Two ECGs completed showed sinus rhythm with left bundle branch block and possible inferior infarct pattern, age indeterminate.  Reportedly old tracing from 2011 did not demonstrate left bundle branch block.  Chest CT imaging was negative for pulmonary embolus and showed chronic interstitial/fibrotic changes with stable prominent mediastinal lymph nodes, coronary atherosclerosis.

## 2017-04-17 ENCOUNTER — Other Ambulatory Visit: Payer: Self-pay | Admitting: Family Medicine

## 2017-04-28 ENCOUNTER — Ambulatory Visit (INDEPENDENT_AMBULATORY_CARE_PROVIDER_SITE_OTHER): Payer: Medicare Other | Admitting: *Deleted

## 2017-04-28 DIAGNOSIS — E538 Deficiency of other specified B group vitamins: Secondary | ICD-10-CM | POA: Diagnosis not present

## 2017-05-04 DIAGNOSIS — M810 Age-related osteoporosis without current pathological fracture: Secondary | ICD-10-CM | POA: Diagnosis not present

## 2017-05-04 DIAGNOSIS — E7800 Pure hypercholesterolemia, unspecified: Secondary | ICD-10-CM | POA: Diagnosis not present

## 2017-05-04 DIAGNOSIS — Z87440 Personal history of urinary (tract) infections: Secondary | ICD-10-CM | POA: Diagnosis not present

## 2017-05-04 DIAGNOSIS — R0789 Other chest pain: Secondary | ICD-10-CM | POA: Diagnosis not present

## 2017-05-04 DIAGNOSIS — I2584 Coronary atherosclerosis due to calcified coronary lesion: Secondary | ICD-10-CM | POA: Diagnosis not present

## 2017-05-04 DIAGNOSIS — I2510 Atherosclerotic heart disease of native coronary artery without angina pectoris: Secondary | ICD-10-CM | POA: Diagnosis not present

## 2017-05-04 DIAGNOSIS — J90 Pleural effusion, not elsewhere classified: Secondary | ICD-10-CM | POA: Diagnosis not present

## 2017-05-04 DIAGNOSIS — I700 Atherosclerosis of aorta: Secondary | ICD-10-CM | POA: Diagnosis not present

## 2017-05-04 DIAGNOSIS — Z79899 Other long term (current) drug therapy: Secondary | ICD-10-CM | POA: Diagnosis not present

## 2017-05-04 DIAGNOSIS — J939 Pneumothorax, unspecified: Secondary | ICD-10-CM | POA: Diagnosis not present

## 2017-05-19 DIAGNOSIS — M4807 Spinal stenosis, lumbosacral region: Secondary | ICD-10-CM | POA: Diagnosis not present

## 2017-05-19 DIAGNOSIS — R030 Elevated blood-pressure reading, without diagnosis of hypertension: Secondary | ICD-10-CM | POA: Diagnosis not present

## 2017-05-23 ENCOUNTER — Other Ambulatory Visit: Payer: Self-pay | Admitting: Neurosurgery

## 2017-05-23 DIAGNOSIS — M4807 Spinal stenosis, lumbosacral region: Secondary | ICD-10-CM

## 2017-05-27 ENCOUNTER — Ambulatory Visit (INDEPENDENT_AMBULATORY_CARE_PROVIDER_SITE_OTHER)
Admission: RE | Admit: 2017-05-27 | Discharge: 2017-05-27 | Disposition: A | Discharge status: Home / Self Care | Payer: Medicare Other | Source: Ambulatory Visit | Attending: Internal Medicine | Admitting: Internal Medicine

## 2017-05-27 ENCOUNTER — Encounter: Payer: Self-pay | Admitting: Internal Medicine

## 2017-05-27 ENCOUNTER — Ambulatory Visit (INDEPENDENT_AMBULATORY_CARE_PROVIDER_SITE_OTHER): Payer: Medicare Other | Admitting: Internal Medicine

## 2017-05-27 VITALS — BP 106/68 | HR 65 | Ht <= 58 in | Wt 104.8 lb

## 2017-05-27 DIAGNOSIS — J84112 Idiopathic pulmonary fibrosis: Secondary | ICD-10-CM

## 2017-05-27 DIAGNOSIS — J9312 Secondary spontaneous pneumothorax: Secondary | ICD-10-CM

## 2017-05-27 DIAGNOSIS — I2510 Atherosclerotic heart disease of native coronary artery without angina pectoris: Secondary | ICD-10-CM

## 2017-05-27 DIAGNOSIS — R634 Abnormal weight loss: Secondary | ICD-10-CM

## 2017-05-27 DIAGNOSIS — J939 Pneumothorax, unspecified: Secondary | ICD-10-CM | POA: Diagnosis not present

## 2017-05-27 NOTE — Progress Notes (Signed)
Subjective:     Patient ID: [name], female   DOB: [birth year], 84 y.o.   MRN: [id number]  HPI    PCP Redge Gainer, MD   IOV 07/07/2015  Chief Complaint  Patient presents with  . Advice Only    Referred by Dr. Laurance Flatten for pulmonary nodule.       84 year old female accompanied by her daughter [name]. Referred for pulmonary nodule. According to the daughter and the patient will Sr. he is not fully clear with a long-standing history of pulmonary nodule that has been followed locally for many years review of the chart shows that she's had CT scan of the chest for many years. The CT scans of the chest. All the way back to 2004/2006 and described pulmonary fibrosis and nodule. I only have the images from 2014 that shows bilateral upper lobe pulmonary fibrosis with some involvement in the lower lobes. Non-UIP pattern. Most recently she had CT scan of the chest 06/30/2015. According to the daughter ration had some infectious symptoms at this time but again the CT was being done to evaluate for follow-up nodule and a new nodule was discovered which is described to be in the right upper lobe 6 mm and therefore has been referred here. Currently patient is feeling well baseline which is just mild intermittent occasional cough and shortness of breath with wheezing for which she is on maintenance inhaler possibly Brio which helps. Overall quality of life is fine and she does not wish for aggressive intervention.  I personally visualized the CT chest below. In terms of pulmonary fibrosis she is unaware of the diagnosis. Inour chart she's not had any autoimmune workup. She does live in an extremely old house over 53 years old. They think there might be some mold in it. She has chronic arthralgia but no diagnosed autoimmune disease   CT chest 06/30/15 EXAM: CT CHEST WITHOUT CONTRAST  TECHNIQUE: Multidetector CT imaging of the chest was performed following the standard protocol without IV  contrast.  COMPARISON: Chest radiograph 08/29/2015  FINDINGS: Mediastinum/Lymph Nodes: There are multiple borderline enlarged mediastinal lymph nodes, with reactive appearance. The heart is mildly enlarged. There is no pericardial effusion. Calcified atherosclerotic disease of the coronary arteries is seen.  Lungs/Pleura: There is no evidence of pneumothorax, pleural effusion or acute airspace consolidation. There are subpleural with upper lobe predominance fibrotic changes of the lungs bilaterally with coarsening of the interstitial markings, mild honeycombing and traction bronchiectasis. An area of scarring versus pulmonary nodule is seen in the subpleural right upper lobe and measures 6.4 mm (image 40/148, sequence 3). Mild pleural thickening is noted anteriorly at the level of the right middle lobe/ lingula.  Upper abdomen: No acute findings.  Musculoskeletal: No chest wall mass or suspicious bone lesions identified.  IMPRESSION: Upper lobe predominant chronic interstitial lung disease.  Multiple borderline enlarged mediastinal lymph nodes.  Differential diagnosis includes ankylosing spondylitis, sarcoidosis, silicosis, eosinophilic granuloma or prior granulomatous infection such as tuberculosis.  6.4 mm right upper lobe pulmonary nodule versus area of prominent scarring. Initial follow-up with CT at 6-12 months is recommended to confirm persistence. If persistent, repeat CT is recommended every 2 years until 5 years of stability has been established. This recommendation follows the consensus statement: Guidelines for Management of Incidental Pulmonary Nodules Detected on CT Images:From the Fleischner Society 2017; published online before print (10.1148/[id number]).   Electronically Signed  By: Fidela Salisbury M.D.  On: 06/30/2015 17:20  OV 02/15/2016  Chief Complaint  Patient presents  with  . Follow-up    Pt here after CT chest. Pt  states her breathing is unchanged since last OV. Pt denies cough and CP/tightness.    Follow-up 6 mm right upper lobe nodule  In follow-up interstitial lung disease not otherwise specified base in June 2017 CT chest  She had follow-up CT chest recommended below that at personally visualized with her and her daughter [name]. According to me the right upper lobe nodule is stable and I agree with the report. The interstitial lung disease pattern appears to be one of possible UIP. She has an elevated rheumatoid factor but negative CCP. Did tell me that they saw Dr. Trudie Reed rheumatologist and has been reassured that there is no systemic evidence of rheumatoid arthritis. I do not have the chart with me. Overall she's stable but does get dyspneic doing gardening and then relieved by rest and it is no chest pain. She wants to continue to be as physically active as possible she is not interested in pulmonary rehabilitation.  Of note I did review her CT chest in 2013 and 2017. Today I think ILD slowly progressive IMPRESSION: 1. Stable CT of the chest. 2. Similar appearance of chronic interstitial lung disease compatible with pulmonary fibrosis. This may be secondary to nonspecific interstitial pneumonia or usual interstitial pneumonia (UIP.) 3. Small pulmonary nodules are again noted and appears stable from 06/30/2015. Non-contrast chest CT can be considered at 12 months (from 07/01/2015) if patient is high-risk. This recommendation follows the consensus statement: Guidelines for Management of Incidental Pulmonary Nodules Detected on CT Images: From the Fleischner Society 2017; Radiology 2017; [id number]. 4.   Electronically Signed   By: Kerby Moors M.D.   On: 12/19/2015 14:54  OV 08/09/2016  Chief Complaint  Patient presents with  . Follow-up    Pt states her breathing is unchanged sicne last OV. Pt states she has a dry cough. Pt denies CP/tightness and f/c/s.    Follow-up idiopathic  pulmonary fibrosis diagnosis based based on age, possible UIP on CT scan and negative rheumatology evaluation. Diagnosis given 02/15/2016  Follow-up lung nodule  Overall she's doing stable. Today in the office she walked coronary 5 feet 3 laps on room air: She was unable to do  and then stopped because of hip pain and knee pain is chronic issues. She continues to live alone and her daughter [name] is with her today. She is functional in the house able to gardening but then stopped because of dyspnea. She does have some wheeze for which she takes Brio although there is no emphysema on the CT scan.   OV 10/03/2016    Chief Complaint  Patient presents with  . Follow-up    Pt attempted to do the PFT but was unable to complete. Denies any comlaints or concerns. Occ. SOB. Denies any CP or cough.    Follow-up IPF on basic supportive care. Last saw her in June 2018. At this point now she is stable. We will gardening. She complains of dyspnea on exertion relieved by rest it is mild to moderate. It is not any worse. She could not do her PFTs today. In the past walked this also failed. I told her that we will just follow her symptoms. She is but is pitting in the IPF registry by BI . Daugher [name] with her  follow-up lung nodule: She has her next scans December 18   OV 02/03/2017   Chief Complaint  Patient presents with  . Follow-up  CT scan done 12/16/16.     Pt states she has been doing good since last visit. Breathing is the same as it was at last visit. Pt still losing weight.    Follow-up idiopathic interstitial ILD: Clinical diagnosis of IPF given based on age and also presence o of traction bronchiectasis and also negative autoimmune profile.  Overall clinically fine.  She is doing gardening and cooking and doing activities of daily living without any chest pain or shortness of breath.  Her CT chest in dec 2018 shows prgression since 2012. However, asymptomatic and Walking desaturation  test on 02/03/2017 185 feet x 3 laps on ROOM AIR:  did NOT desaturate. Rest pulse ox was 100%, final pulse ox was 100%. HR response 74/min at rest to 83/min at peak exertion. Patient LACRESHIA BONDARENKO  Did not Desaturate < 88% . Vaughan Basta did not  Desaturated </= 3% points. Vaughan Basta did not get tachyardic. OF note, she is in IPF Registry and she did  A visit with [place] with blood test and questionnaire. She still is interested in continuing that   Lung nodule 26mm sable 2012 -> 2018   Ne finding  does have coronary artery calcification on CT - no chest pain. Prefers expectant followup   CT [facility] dec 2018 Lungs/Pleura: Extensive subpleural reticulation, interstitial coarsening and traction bronchiectasis/bronchiolectasis without a definite zonal predominance. Findings are progressive from 07/11/2010. 5 mm subpleural right lower lobe nodule (image 93), unchanged from 2012 and considered benign. No pleural fluid. Airway is unremarkable.  Upper Abdomen: Visualized portions of the liver, adrenal glands, kidneys, spleen, pancreas, stomach and bowel are grossly unremarkable.  Musculoskeletal: Degenerative changes in the spine. No worrisome lytic or sclerotic lesions.  IMPRESSION: 1. Fibrotic interstitial lung disease has progressed from 07/11/2010 and may be due to fibrotic nonspecific interstitial pneumonitis. Distribution is considered somewhat non typical for usual interstitial pneumonitis given areas of more central interstitial involvement as well as lack of a strong craniocaudal gradient, but is not excluded. 2. Aortic atherosclerosis (ICD10-170.0). Severe coronary artery calcification. 3. Enlarged pulmonary arteries, indicative of pulmonary arterial hypertension.   Electronically Signed   By: Lorin Picket M.D.   On: 12/16/2016 12:18   03/24/2017 Follow up : IPF , ER follow up  Patient presents for a follow-up visit.  She was recently seen in the emergency  room at [facility]. 2 weeks ago.  She had increased shortness of breath and chest pain. Says ER workup was reported neg for cardiac. CT chest showed no PE , stable chronic changes.  EKG was reported as Left BBB.  No further episodes.  No FH of CAD. No HTN . + hyperlipidemia on statin .    Patient has underlying IPF, clinical diagnosis, has been essentially stable.  On supportive care.  Most recent CT chest in December 2018 show some progression since 2012 but overall felt to be clinically stable. Remains active , does all housework and gardening . Does not drive.(has never driven)   Continues to have weight loss. On ensure. Says PCP has worked up but has not found an  OV 05/27/2017  Chief Complaint  Patient presents with  . Follow-up    Pt had a spontaneous pneumothorax per pt's daughter.  Pt went to ER 02/2017 due to CP and was thinking it was angina and went back to ER 04/2017 with the same pain in chest and pain ended up being due to a pneumothorax.  Since the ER visit  when pt was given pain meds, she has been doing good.   Follow-up idiopathic pulmonary fibrosis [clinical diagnosis] basic supportive care   Routine follow-up for this IPF patient.  Last seen by myself in January 2019.  Then in February 2019 ended up in [facility] emergency department for chest pains.  She followed up with nurse practitioner here in April 2019.  Subsequent to that visit on May 04, 2017 she developed another episode of left-sided chest pain went back to [facility] emergency department.  PE ruled out.  Had a CT scan of the chest that I personally visualized and it showed a small less than 5% apical pneumothorax and in retrospect this was also present in the February 2019 CT chest although it definitely has progressed in April 2019.  I personally visualized and confirmed these findings.  The daughter has a stack of records with her that I personally reviewed.  These are from the [facility] emergency department and  basically confirmed this.  The treatment elected was observation.  At this point in time patient and her daughter report that the patient is functional without any symptoms.  She is does gardening.  The main issue is that she is continuing to lose weight.  Walking desaturation test in our office today as documented below and was essentially normal.  Simple office walk 185 feet x  3 laps goal with forehead probe 05/27/2017   O2 used Room air  Number laps completed 3  Comments about pace ver slow but independent  Resting Pulse Ox/HR 100% and 65/min  Final Pulse Ox/HR 99% and 88/min  Desaturated </= 88% no  Desaturated <= 3% points no  Got Tachycardic >/= 90/min   Symptoms at end of test Mild fatigute and dyspne  Miscellaneous comments stable      has a past medical history of Arthritis, Cataract, Chronic back pain, Glaucoma, Hyperlipidemia, Idiopathic pulmonary fibrosis ([place]), Lumbar stenosis, Osteoarthrosis and allied disorders, Osteoporosis, Peripheral neuropathy, and Vitamin D deficiency.   reports that she has never smoked. She has never used smokeless tobacco.  Past Surgical History:  Procedure Laterality Date  . ABDOMINAL HYSTERECTOMY    . CATARACT EXTRACTION    . Cataract surgery Bilateral   . Eyelid surgery    . KNEE ARTHROSCOPY    . LUMBAR LAMINECTOMY      Allergies  Allergen Reactions  . Levaquin [Levaquin Leva-Pak]     Patient cannot remember that she is sensitive to this medication; has never heard of it.  . Mucinex [Guaifenesin Er]     Causes anxiety  . Erythromycin Diarrhea  . Penicillins Rash    Immunization History  Administered Date(s) Administered  . Influenza Split 10/28/2012  . Influenza Whole 09/14/2009  . Influenza, High Dose Seasonal PF 10/07/2016  . Influenza, Quadrivalent, Recombinant, Inj, Pf 11/08/2015  . Influenza,inj,Quad PF,6+ Mos 10/24/2014  . Influenza,inj,quad, With Preservative 11/10/2013  . Pneumococcal Conjugate-13 12/31/2012  .  Pneumococcal Polysaccharide-23 10/14/1996  . Td 10/15/2002  . Zoster 02/05/2010    Family History  Problem Relation Age of Onset  . Bone cancer Mother   . Breast cancer Sister   . Diabetes Brother   . Asthma Daughter   . COPD Son   . Asthma Son      Current Outpatient Medications:  .  albuterol (VENTOLIN HFA) 108 (90 Base) MCG/ACT inhaler, 2 puffs every 4-6 hours as needed for wheezing, shortness of breath, Disp: , Rfl:  .  alendronate (FOSAMAX) 70 MG tablet, TAKE 1 TABLET  WEEKLY (TAKE WITH 8OZ OF WATER 30 MINUTES BEFORE BREAKFAST), Disp: 12 tablet, Rfl: 2 .  aspirin EC 81 MG tablet, Take 1 tablet (81 mg total) by mouth daily., Disp: 90 tablet, Rfl: 3 .  BREO ELLIPTA 100-25 MCG/INH AEPB, USE 1 INHALATION DAILY, Disp: 60 each, Rfl: 5 .  calcium carbonate (OS-CAL) 600 MG TABS tablet, Take 600 mg by mouth daily with breakfast., Disp: , Rfl:  .  cephALEXin (KEFLEX) 500 MG capsule, Take 1 capsule by mouth 3 (three) times daily. X 1 week - then 1 nightly (dr Jeffie Pollock), Disp: , Rfl:  .  cholecalciferol (VITAMIN D) 1000 UNITS tablet, Take 1,000 Units by mouth daily., Disp: , Rfl:  .  fluticasone (FLONASE) 50 MCG/ACT nasal spray, USE 2 SPRAYS IN EACH NOSTRIL DAILY, Disp: 16 g, Rfl: 4 .  ibuprofen (ADVIL,MOTRIN) 200 MG tablet, Take 200 mg by mouth every 6 (six) hours as needed., Disp: , Rfl:  .  Multiple Vitamins-Minerals (CENTRUM SILVER) tablet, Take 1 tablet by mouth every evening., Disp: , Rfl:  .  ranitidine (ZANTAC) 150 MG tablet, Take 150 mg by mouth 2 (two) times daily., Disp: , Rfl:  .  simvastatin (ZOCOR) 80 MG tablet, Take 40 mg by mouth daily., Disp: , Rfl:  .  nitroGLYCERIN (NITROSTAT) 0.4 MG SL tablet, Place 1 tablet (0.4 mg total) under the tongue every 5 (five) minutes as needed for chest pain. (Patient not taking: Reported on 05/27/2017), Disp: 25 tablet, Rfl: 3  Current Facility-Administered Medications:  .  cyanocobalamin ((VITAMIN B-12)) injection 1,000 mcg, 1,000 mcg,  Intramuscular, Q30 days, Chipper Herb, MD, 1,000 mcg at 04/28/17 1024    Review of Systems     Objective:   Physical Exam Vitals:   05/27/17 1112  BP: 106/68  Pulse: 65  SpO2: 99%  Weight: 104 lb 12.8 oz (47.5 kg)  Height: 4\' 10"  (1.473 m)    Estimated body mass index is 21.9 kg/m as calculated from the following:   Height as of this encounter: 4\' 10"  (1.473 m).   Weight as of this encounter: 104 lb 12.8 oz (47.5 kg).     Assessment:       ICD-10-CM   1. IPF (idiopathic pulmonary fibrosis) ([place]) J84.112   2. Secondary spontaneous pneumothorax J93.12   3. Weight loss R63.4     General Appearance:    dconditioned looking, frail,  Head:    Normocephalic, without obvious abnormality, atraumatic  Eyes:    PERRL - yes, conjunctiva/corneas - clear      Ears:    Normal external ear canals, both ears  Nose:   NG tube - no  Throat:  ETT TUBE - no , OG tube - no  Neck:   Supple,  No enlargement/tenderness/nodules     Lungs:     Clear to auscultation bilaterally overall bu thas basal crackles. Equal air entry  Chest wall:    No deformity  Heart:    S1 and S2 normal, no murmur, CVP - no.  Pressors - no  Abdomen:     Soft, no masses, no organomegaly  Genitalia:    Not done  Rectal:   not done  Extremities:   Extremities- intact     Skin:   Intact in exposed areas      Neurologic:   Sedation - none -> RASS - na . Moves all 4s - yes. CAM-ICU - neg . Orientation - x3+         Plan:  IPF (idiopathic pulmonary fibrosis) (HCC) -Clinically stable =  test for any progression with the help of walking desaturation test in the office and overnight oxygen study at home -Continue daily activities and basic supportive care  Secondary spontaneous pneumothorax -This is probably a complication of idiopathic pulmonary fibrosis -Currently it is chronic with mild progression between February 2019 and April 2019 -As of February 2019 and April 2019 it is very small and does not  require specific treatment -We will check a chest x-ray today May 27, 2017 to see if there is any progression -If there is significant progression at any time he might need chest tube treatment -Keep an eye at home for any sudden worsening with chest pain or shortness of breath in that case go to the emergency room for a chest tube -Treatment of this condition can be very difficult because of the underlying fibrotic lung and difficulty with a lung expanding back   Weight loss -Unclear cause but might be related to aging and pulmonary fibrosis  Follow-up -We will call with results of the overnight oxygen study -Otherwise 3 months for routine follow-up   Dr. Brand Males, M.D., [facility].C.P Pulmonary and Critical Care Medicine Staff Physician, [place] Director - Interstitial Lung Disease  Program  Pulmonary [place] at [place], [name], [address]  Pager: [phone], If no answer or between  15:00h - 7:00h: call [phone] Telephone: [phone]

## 2017-05-27 NOTE — Patient Instructions (Addendum)
ICD-10-CM   1. IPF (idiopathic pulmonary fibrosis) ([place]) J84.112   2. Secondary spontaneous pneumothorax J93.12   3. Weight loss R63.4     IPF (idiopathic pulmonary fibrosis) (HCC) -Clinically stable =  test for any progression with the help of walking desaturation test in the office and overnight oxygen study at home -Continue daily activities and basic supportive care  Secondary spontaneous pneumothorax -This is probably a complication of idiopathic pulmonary fibrosis -Currently it is chronic with mild progression between February 2019 and April 2019 -As of February 2019 and April 2019 it is very small and does not require specific treatment -We will check a chest x-ray today May 27, 2017 to see if there is any progression -If there is significant progression at any time he might need chest tube treatment -Keep an eye at home for any sudden worsening with chest pain or shortness of breath in that case go to the emergency room for a chest tube -Treatment of this condition can be very difficult because of the underlying fibrotic lung and difficulty with a lung expanding back   Weight loss -Unclear cause but might be related to aging and pulmonary fibrosis  Follow-up -We will call with results of the overnight oxygen study -Otherwise 3 months for routine follow-up \

## 2017-05-28 ENCOUNTER — Ambulatory Visit (INDEPENDENT_AMBULATORY_CARE_PROVIDER_SITE_OTHER): Payer: Medicare Other | Admitting: Family Medicine

## 2017-05-28 ENCOUNTER — Encounter: Payer: Self-pay | Admitting: Family Medicine

## 2017-05-28 VITALS — BP 144/63 | HR 65 | Temp 96.7°F | Ht <= 58 in | Wt 105.0 lb

## 2017-05-28 DIAGNOSIS — I2510 Atherosclerotic heart disease of native coronary artery without angina pectoris: Secondary | ICD-10-CM | POA: Diagnosis not present

## 2017-05-28 DIAGNOSIS — E7800 Pure hypercholesterolemia, unspecified: Secondary | ICD-10-CM

## 2017-05-28 DIAGNOSIS — E559 Vitamin D deficiency, unspecified: Secondary | ICD-10-CM | POA: Diagnosis not present

## 2017-05-28 DIAGNOSIS — E538 Deficiency of other specified B group vitamins: Secondary | ICD-10-CM

## 2017-05-28 DIAGNOSIS — D531 Other megaloblastic anemias, not elsewhere classified: Secondary | ICD-10-CM

## 2017-05-28 DIAGNOSIS — R911 Solitary pulmonary nodule: Secondary | ICD-10-CM

## 2017-05-28 DIAGNOSIS — J84112 Idiopathic pulmonary fibrosis: Secondary | ICD-10-CM | POA: Diagnosis not present

## 2017-05-28 DIAGNOSIS — M48061 Spinal stenosis, lumbar region without neurogenic claudication: Secondary | ICD-10-CM

## 2017-05-28 DIAGNOSIS — J849 Interstitial pulmonary disease, unspecified: Secondary | ICD-10-CM

## 2017-05-28 NOTE — Progress Notes (Signed)
Subjective:    Patient ID: [name], female    DOB: 10-18-1925, 84 y.o.   MRN: [id number]  HPI Pt here for follow up and management of chronic medical problems which includes hyperlipidemia. She is taking medication regularly.  The recent note from her pulmonologist was reviewed.  It was felt that the patient had a very small pneumothorax in addition to her ongoing idiopathic pulmonary fibrosis.  This visit was actually yesterday with Dr. Chase Caller.  The visits to the emergency room and to the cardiologist and the pulmonologist were discussed and reviewed with the patient and her daughter.  She does have a small pneumothorax and the pulmonologist feels that it will take care of itself without having to worry about this.  She was instructed that if she developed any severe shortness of breath that she should get to the emergency room.  Also encourage the patient to wear her Lifeline continuously and not take it off.  The patient currently denies any chest pain and says that she is not having any shortness of breath any worse than usual.  She takes a lot of pride in her independence and is not willingly wanting to go see more doctors but I did encourage her to follow-up with the cardiologist and with the pulmonologist as planned.  She denies any trouble with swallowing heartburn indigestion nausea vomiting diarrhea or blood in the stool.  She is passing her water well and is making every effort to drink plenty of water and fluids   Patient Active Problem List   Diagnosis Date Noted  . Atypical chest pain 03/24/2017  . Weight loss 03/24/2017  . IPF (idiopathic pulmonary fibrosis) ([place]) 08/09/2016  . Interstitial lung disease ([place]) 07/07/2015  . Nodule of right lung 07/07/2015  . Chronic cystitis 01/08/2013  . Vitamin D deficiency 12/31/2012  . Osteoporosis 12/31/2012  . Spinal stenosis of lumbar region 12/31/2012  . Hyperlipidemia 07/16/2012  . Chronic low back pain 07/16/2012  . Vitamin  B12 deficient megaloblastic anemia 07/16/2012   Outpatient Encounter Medications as of 05/28/2017  Medication Sig  . albuterol (VENTOLIN HFA) 108 (90 Base) MCG/ACT inhaler 2 puffs every 4-6 hours as needed for wheezing, shortness of breath  . alendronate (FOSAMAX) 70 MG tablet TAKE 1 TABLET WEEKLY (TAKE WITH 8OZ OF WATER 30 MINUTES BEFORE BREAKFAST)  . aspirin EC 81 MG tablet Take 1 tablet (81 mg total) by mouth daily.  Marland Kitchen BREO ELLIPTA 100-25 MCG/INH AEPB USE 1 INHALATION DAILY  . calcium carbonate (OS-CAL) 600 MG TABS tablet Take 600 mg by mouth daily with breakfast.  . cephALEXin (KEFLEX) 500 MG capsule Take 1 capsule by mouth 3 (three) times daily. X 1 week - then 1 nightly (dr Jeffie Pollock)  . cholecalciferol (VITAMIN D) 1000 UNITS tablet Take 1,000 Units by mouth daily.  . fluticasone (FLONASE) 50 MCG/ACT nasal spray USE 2 SPRAYS IN EACH NOSTRIL DAILY  . ibuprofen (ADVIL,MOTRIN) 200 MG tablet Take 200 mg by mouth every 6 (six) hours as needed.  . Multiple Vitamins-Minerals (CENTRUM SILVER) tablet Take 1 tablet by mouth every evening.  . nitroGLYCERIN (NITROSTAT) 0.4 MG SL tablet Place 1 tablet (0.4 mg total) under the tongue every 5 (five) minutes as needed for chest pain.  . ranitidine (ZANTAC) 150 MG tablet Take 150 mg by mouth 2 (two) times daily.  . simvastatin (ZOCOR) 80 MG tablet Take 40 mg by mouth daily.   Facility-Administered Encounter Medications as of 05/28/2017  Medication  . cyanocobalamin ((VITAMIN B-12))  injection 1,000 mcg      Review of Systems  Constitutional: Negative.   HENT: Negative.   Eyes: Negative.   Respiratory: Negative.        Recent lung issues (seen pulmonary and cardio)  Cardiovascular: Negative.   Gastrointestinal: Negative.   Endocrine: Negative.   Genitourinary: Negative.   Musculoskeletal: Negative.   Skin: Negative.   Allergic/Immunologic: Negative.   Neurological: Negative.   Hematological: Negative.   Psychiatric/Behavioral: Negative.          Objective:   Physical Exam  Constitutional: She is oriented to person, place, and time. No distress.  Is small framed somewhat kyphotic but very alert for her age.  Would also say very stubborn!  HENT:  Head: Normocephalic and atraumatic.  Nose: Nose normal.  Mouth/Throat: Oropharynx is clear and moist.  Bilateral ear cerumen  Eyes: Pupils are equal, round, and reactive to light. Conjunctivae and EOM are normal. Right eye exhibits no discharge. Left eye exhibits no discharge.  Neck: Normal range of motion. Neck supple. No thyromegaly present.  No bruits thyromegaly or anterior cervical adenopathy  Cardiovascular: Normal rate, regular rhythm, normal heart sounds and intact distal pulses.  No murmur heard. Heart has a regular rate and rhythm at 72/min  Pulmonary/Chest: Effort normal. No respiratory distress. She has wheezes. She has rales.  Breath sounds are somewhat diminished and faint bilaterally.  There are minimal rales with wheezes on the left side.  Abdominal: Soft. Bowel sounds are normal. She exhibits no mass. There is no tenderness. There is no guarding.  Musculoskeletal: She exhibits deformity. She exhibits no edema.  Patient's movement and activity with walking is somewhat unstable.  This is most likely due to her kyphotic posture.  Lymphadenopathy:    She has no cervical adenopathy.  Neurological: She is alert and oriented to person, place, and time. She has normal reflexes. No cranial nerve deficit.  Skin: Skin is warm and dry. No rash noted.  Psychiatric: She has a normal mood and affect. Her behavior is normal. Judgment and thought content normal.  Patient's mood and affect and behavior are normal.  Her brain is working adequately.  Nursing note and vitals reviewed.  BP (!) 144/63 (BP Location: Left Arm)   Pulse 65   Temp (!) 96.7 F (35.9 C) (Oral)   Ht '4\' 10"'$  (1.473 m)   Wt 105 lb (47.6 kg)   BMI 21.95 kg/m         Assessment & Plan:  1. Vitamin D  deficiency -Continue with current treatment with vitamin D pending results of lab work - CBC with Differential/Platelet - VITAMIN D 25 Hydroxy (Vit-D Deficiency, Fractures)  2. B12 deficiency -We will make sure the patient is getting her B12 shots regularly - CBC with Differential/Platelet  3. Pure hypercholesterolemia -Continue with current treatment and therapeutic lifestyle changes - CBC with Differential/Platelet - BMP8+EGFR - Lipid panel - Hepatic function panel  4. Spinal stenosis of lumbar region without neurogenic claudication -Follow-up with neurosurgery as needed  5. Interstitial lung disease ([place]) -Follow-up with pulmonologist, Dr. Chase Caller as planned  6. Vitamin B12 deficient megaloblastic anemia -Check B12 level  7. Idiopathic pulmonary fibrosis ([place]) -Follow-up with pulmonology as planned  8. Pulmonary nodule seen on imaging study -Follow-up with pulmonology  Patient Instructions                       Medicare Annual Wellness Visit  [facility] and the medical providers at [facility] strive to bring you the best medical care.  In doing so we not only want to address your current medical conditions and concerns but also to detect new conditions early and prevent illness, disease and health-related problems.    Medicare offers a yearly Wellness Visit which allows our clinical staff to assess your need for preventative services including immunizations, lifestyle education, counseling to decrease risk of preventable diseases and screening for fall risk and other medical concerns.    This visit is provided free of charge (no copay) for all Medicare recipients. The clinical pharmacists at [facility] have begun to conduct these Wellness Visits which will also include a thorough review of all your medications.    As you primary medical provider recommend that you make an appointment for your Annual Wellness Visit if you  have not done so already this year.  You may set up this appointment before you leave today or you may call back ([phone]) and schedule an appointment.  Please make sure when you call that you mention that you are scheduling your Annual Wellness Visit with the clinical pharmacist so that the appointment may be made for the proper length of time.     Continue current medications. Continue good therapeutic lifestyle changes which include good diet and exercise. Fall precautions discussed with patient. If an FOBT was given today- please return it to our front desk. If you are over 67 years old - you may need Prevnar 55 or the adult Pneumonia vaccine.  **Flu shots are available--- please call and schedule a FLU-CLINIC appointment**  After your visit with [place] today you will receive a survey in the mail or online from [employer] regarding your care with [place]. Please take a moment to fill this out. Your feedback is very important to [place] as you can help [place] better understand your patient needs as well as improve your experience and satisfaction. WE CARE ABOUT YOU!!!   Continue to follow-up with pulmonology as planned Eat healthy Right plenty of water and fluids Follow-up with cardiology as planned Wear a lifeline continuously and do not take it off and put it back on Be careful and avoid climbing as much as possible Notify your family if you develop any sudden onset of shortness of breath Continue to drink plenty of fluids and stay well-hydrated and eat healthy    Arrie Senate MD

## 2017-05-28 NOTE — Addendum Note (Signed)
Addended by: Zannie Cove on: 05/28/2017 02:30 PM   Modules accepted: Orders

## 2017-05-28 NOTE — Patient Instructions (Addendum)
Medicare Annual Wellness Visit  [facility] and the medical providers at [facility] strive to bring you the best medical care.  In doing so we not only want to address your current medical conditions and concerns but also to detect new conditions early and prevent illness, disease and health-related problems.    Medicare offers a yearly Wellness Visit which allows our clinical staff to assess your need for preventative services including immunizations, lifestyle education, counseling to decrease risk of preventable diseases and screening for fall risk and other medical concerns.    This visit is provided free of charge (no copay) for all Medicare recipients. The clinical pharmacists at [facility] have begun to conduct these Wellness Visits which will also include a thorough review of all your medications.    As you primary medical provider recommend that you make an appointment for your Annual Wellness Visit if you have not done so already this year.  You may set up this appointment before you leave today or you may call back ([phone]) and schedule an appointment.  Please make sure when you call that you mention that you are scheduling your Annual Wellness Visit with the clinical pharmacist so that the appointment may be made for the proper length of time.     Continue current medications. Continue good therapeutic lifestyle changes which include good diet and exercise. Fall precautions discussed with patient. If an FOBT was given today- please return it to our front desk. If you are over 82 years old - you may need Prevnar 52 or the adult Pneumonia vaccine.  **Flu shots are available--- please call and schedule a FLU-CLINIC appointment**  After your visit with [place] today you will receive a survey in the mail or online from [employer] regarding your care with [place]. Please take a moment to fill this out. Your feedback is very  important to [place] as you can help [place] better understand your patient needs as well as improve your experience and satisfaction. WE CARE ABOUT YOU!!!   Continue to follow-up with pulmonology as planned Eat healthy Right plenty of water and fluids Follow-up with cardiology as planned Wear a lifeline continuously and do not take it off and put it back on Be careful and avoid climbing as much as possible Notify your family if you develop any sudden onset of shortness of breath Continue to drink plenty of fluids and stay well-hydrated and eat healthy

## 2017-05-29 ENCOUNTER — Ambulatory Visit: Payer: Medicare Other

## 2017-05-29 LAB — CBC WITH DIFFERENTIAL/PLATELET
Basophils Absolute: 0 10*3/uL (ref 0.0–0.2)
Basos: 1 %
EOS (ABSOLUTE): 0.2 10*3/uL (ref 0.0–0.4)
Eos: 3 %
HEMOGLOBIN: 13.1 g/dL (ref 11.1–15.9)
Hematocrit: 41.4 % (ref 34.0–46.6)
IMMATURE GRANS (ABS): 0 10*3/uL (ref 0.0–0.1)
Immature Granulocytes: 0 %
LYMPHS: 19 %
Lymphocytes Absolute: 1.1 10*3/uL (ref 0.7–3.1)
MCH: 27.4 pg (ref 26.6–33.0)
MCHC: 31.6 g/dL (ref 31.5–35.7)
MCV: 87 fL (ref 79–97)
MONOCYTES: 9 %
Monocytes Absolute: 0.6 10*3/uL (ref 0.1–0.9)
Neutrophils Absolute: 4.2 10*3/uL (ref 1.4–7.0)
Neutrophils: 68 %
Platelets: 293 10*3/uL (ref 150–379)
RBC: 4.78 x10E6/uL (ref 3.77–5.28)
RDW: 13.7 % (ref 12.3–15.4)
WBC: 6.1 10*3/uL (ref 3.4–10.8)

## 2017-05-29 LAB — BMP8+EGFR
BUN/Creatinine Ratio: 20 (ref 12–28)
BUN: 12 mg/dL (ref 8–27)
CHLORIDE: 101 mmol/L (ref 96–106)
CO2: 26 mmol/L (ref 20–29)
CREATININE: 0.61 mg/dL (ref 0.57–1.00)
Calcium: 9.6 mg/dL (ref 8.7–10.3)
GFR calc Af Amer: 93 mL/min/{1.73_m2} (ref >59)
GFR calc non Af Amer: 81 mL/min/{1.73_m2} (ref >59)
Glucose: 90 mg/dL (ref 65–99)
Potassium: 4.1 mmol/L (ref 3.5–5.2)
SODIUM: 144 mmol/L (ref 134–144)

## 2017-05-29 LAB — HEPATIC FUNCTION PANEL
ALBUMIN: 4.4 g/dL (ref 3.5–4.7)
ALK PHOS: 75 IU/L (ref 39–117)
ALT: 10 IU/L (ref 0–32)
AST: 27 IU/L (ref 0–40)
Bilirubin Total: 0.3 mg/dL (ref 0.0–1.2)
Bilirubin, Direct: 0.13 mg/dL (ref 0.00–0.40)
TOTAL PROTEIN: 6.9 g/dL (ref 6.0–8.5)

## 2017-05-29 LAB — LIPID PANEL
CHOLESTEROL TOTAL: 178 mg/dL (ref 100–199)
Chol/HDL Ratio: 2.5 ratio (ref 0.0–4.4)
HDL: 70 mg/dL (ref >39)
LDL Calculated: 86 mg/dL (ref 0–99)
Triglycerides: 108 mg/dL (ref 0–149)
VLDL CHOLESTEROL CAL: 22 mg/dL (ref 5–40)

## 2017-05-29 LAB — VITAMIN D 25 HYDROXY (VIT D DEFICIENCY, FRACTURES): Vit D, 25-Hydroxy: 48.4 ng/mL (ref 30.0–100.0)

## 2017-05-30 DIAGNOSIS — J84112 Idiopathic pulmonary fibrosis: Secondary | ICD-10-CM | POA: Diagnosis not present

## 2017-05-30 LAB — SPECIMEN STATUS REPORT

## 2017-05-30 LAB — VITAMIN B12: Vitamin B-12: 610 pg/mL (ref 232–1245)

## 2017-06-02 ENCOUNTER — Ambulatory Visit
Admission: RE | Admit: 2017-06-02 | Discharge: 2017-06-02 | Disposition: A | Discharge status: Home / Self Care | Payer: Medicare Other | Source: Ambulatory Visit | Attending: Neurosurgery | Admitting: Neurosurgery

## 2017-06-02 DIAGNOSIS — M4807 Spinal stenosis, lumbosacral region: Secondary | ICD-10-CM

## 2017-06-02 DIAGNOSIS — M47817 Spondylosis without myelopathy or radiculopathy, lumbosacral region: Secondary | ICD-10-CM | POA: Diagnosis not present

## 2017-06-02 MED ORDER — IOPAMIDOL (ISOVUE-M 200) INJECTION 41%
1.0000 mL | Freq: Once | INTRAMUSCULAR | Status: AC
  Administered 2017-06-02: 1 mL via EPIDURAL

## 2017-06-02 MED ORDER — METHYLPREDNISOLONE ACETATE 40 MG/ML INJ SUSP (RADIOLOG
120.0000 mg | Freq: Once | INTRAMUSCULAR | Status: AC
  Administered 2017-06-02: 120 mg via EPIDURAL

## 2017-06-02 NOTE — Discharge Instructions (Signed)

## 2017-06-06 ENCOUNTER — Telehealth: Payer: Self-pay | Admitting: Internal Medicine

## 2017-06-06 NOTE — Telephone Encounter (Signed)
ono < /= 88% for 14 min - on 05/28/17   Plan Ok to start 1L [place] at night if she wants

## 2017-06-11 NOTE — Telephone Encounter (Signed)
Spoke with pt, aware of recs.  Pt does not wish to start nocturnal O2 at this time.  Nothing further needed.

## 2017-06-12 ENCOUNTER — Other Ambulatory Visit: Payer: Self-pay | Admitting: Neurosurgery

## 2017-06-12 DIAGNOSIS — M48061 Spinal stenosis, lumbar region without neurogenic claudication: Secondary | ICD-10-CM

## 2017-06-19 ENCOUNTER — Ambulatory Visit
Admission: RE | Admit: 2017-06-19 | Discharge: 2017-06-19 | Disposition: A | Discharge status: Home / Self Care | Payer: Medicare Other | Source: Ambulatory Visit | Attending: Neurosurgery | Admitting: Neurosurgery

## 2017-06-19 DIAGNOSIS — M545 Low back pain: Secondary | ICD-10-CM | POA: Diagnosis not present

## 2017-06-19 DIAGNOSIS — M48061 Spinal stenosis, lumbar region without neurogenic claudication: Secondary | ICD-10-CM

## 2017-06-19 MED ORDER — METHYLPREDNISOLONE ACETATE 40 MG/ML INJ SUSP (RADIOLOG
120.0000 mg | Freq: Once | INTRAMUSCULAR | Status: AC
  Administered 2017-06-19: 120 mg via EPIDURAL

## 2017-06-19 MED ORDER — IOPAMIDOL (ISOVUE-M 200) INJECTION 41%
1.0000 mL | Freq: Once | INTRAMUSCULAR | Status: AC
  Administered 2017-06-19: 1 mL via EPIDURAL

## 2017-06-19 NOTE — Discharge Instructions (Signed)

## 2017-06-23 ENCOUNTER — Other Ambulatory Visit: Payer: Self-pay | Admitting: Family

## 2017-06-23 DIAGNOSIS — J309 Allergic rhinitis, unspecified: Secondary | ICD-10-CM

## 2017-06-30 ENCOUNTER — Ambulatory Visit (INDEPENDENT_AMBULATORY_CARE_PROVIDER_SITE_OTHER): Payer: Medicare Other | Admitting: *Deleted

## 2017-06-30 DIAGNOSIS — E538 Deficiency of other specified B group vitamins: Secondary | ICD-10-CM | POA: Diagnosis not present

## 2017-06-30 NOTE — Progress Notes (Signed)
Vitamin b12 injection given and patient tolerated well.  

## 2017-06-30 NOTE — Patient Instructions (Signed)
Cyanocobalamin, Vitamin B12 injection What is this medicine? CYANOCOBALAMIN (sye an oh koe BAL a min) is a man made form of vitamin B12. Vitamin B12 is used in the growth of healthy blood cells, nerve cells, and proteins in the body. It also helps with the metabolism of fats and carbohydrates. This medicine is used to treat people who can not absorb vitamin B12. This medicine may be used for other purposes; ask your health care provider or pharmacist if you have questions. COMMON BRAND NAME(S): B-12 Compliance Kit, B-12 Injection Kit, Cyomin, LA-12, Nutri-Twelve, Physicians EZ Use B-12, Primabalt What should I tell my health care provider before I take this medicine? They need to know if you have any of these conditions: -kidney disease -Leber's disease -megaloblastic anemia -an unusual or allergic reaction to cyanocobalamin, cobalt, other medicines, foods, dyes, or preservatives -pregnant or trying to get pregnant -breast-feeding How should I use this medicine? This medicine is injected into a muscle or deeply under the skin. It is usually given by a health care professional in a clinic or doctor's office. However, your doctor may teach you how to inject yourself. Follow all instructions. Talk to your pediatrician regarding the use of this medicine in children. Special care may be needed. Overdosage: If you think you have taken too much of this medicine contact a poison control center or emergency room at once. NOTE: This medicine is only for you. Do not share this medicine with others. What if I miss a dose? If you are given your dose at a clinic or doctor's office, call to reschedule your appointment. If you give your own injections and you miss a dose, take it as soon as you can. If it is almost time for your next dose, take only that dose. Do not take double or extra doses. What may interact with this medicine? -colchicine -heavy alcohol intake This list may not describe all possible  interactions. Give your health care provider a list of all the medicines, herbs, non-prescription drugs, or dietary supplements you use. Also tell them if you smoke, drink alcohol, or use illegal drugs. Some items may interact with your medicine. What should I watch for while using this medicine? Visit your doctor or health care professional regularly. You may need blood work done while you are taking this medicine. You may need to follow a special diet. Talk to your doctor. Limit your alcohol intake and avoid smoking to get the best benefit. What side effects may I notice from receiving this medicine? Side effects that you should report to your doctor or health care professional as soon as possible: -allergic reactions like skin rash, itching or hives, swelling of the face, lips, or tongue -blue tint to skin -chest tightness, pain -difficulty breathing, wheezing -dizziness -red, swollen painful area on the leg Side effects that usually do not require medical attention (report to your doctor or health care professional if they continue or are bothersome): -diarrhea -headache This list may not describe all possible side effects. Call your doctor for medical advice about side effects. You may report side effects to FDA at 1-800-FDA-1088. Where should I keep my medicine? Keep out of the reach of children. Store at room temperature between 15 and 30 degrees C (59 and 85 degrees F). Protect from light. Throw away any unused medicine after the expiration date. NOTE: This sheet is a summary. It may not cover all possible information. If you have questions about this medicine, talk to your doctor, pharmacist, or   health care provider.  2018 Elsevier/Gold Standard (2007-04-13 22:10:20)  

## 2017-07-15 ENCOUNTER — Other Ambulatory Visit: Payer: Medicare Other

## 2017-07-15 DIAGNOSIS — Z1211 Encounter for screening for malignant neoplasm of colon: Secondary | ICD-10-CM | POA: Diagnosis not present

## 2017-07-17 LAB — FECAL OCCULT BLOOD, IMMUNOCHEMICAL: Fecal Occult Bld: NEGATIVE

## 2017-07-28 ENCOUNTER — Ambulatory Visit: Payer: Medicare Other | Admitting: *Deleted

## 2017-07-31 ENCOUNTER — Ambulatory Visit (INDEPENDENT_AMBULATORY_CARE_PROVIDER_SITE_OTHER): Payer: Medicare Other | Admitting: *Deleted

## 2017-07-31 DIAGNOSIS — E538 Deficiency of other specified B group vitamins: Secondary | ICD-10-CM

## 2017-07-31 NOTE — Progress Notes (Signed)
Pt given Cyanocobalamin inj Tolerated well 

## 2017-08-01 ENCOUNTER — Other Ambulatory Visit: Payer: Self-pay | Admitting: Family Medicine

## 2017-08-01 DIAGNOSIS — H33313 Horseshoe tear of retina without detachment, bilateral: Secondary | ICD-10-CM | POA: Diagnosis not present

## 2017-08-01 DIAGNOSIS — H04123 Dry eye syndrome of bilateral lacrimal glands: Secondary | ICD-10-CM | POA: Diagnosis not present

## 2017-09-01 ENCOUNTER — Ambulatory Visit (INDEPENDENT_AMBULATORY_CARE_PROVIDER_SITE_OTHER): Payer: Medicare Other

## 2017-09-01 DIAGNOSIS — E538 Deficiency of other specified B group vitamins: Secondary | ICD-10-CM | POA: Diagnosis not present

## 2017-09-01 NOTE — Progress Notes (Signed)
Cyanocobalamin injection to left deltoid, patient tolerated well

## 2017-09-01 NOTE — Patient Instructions (Signed)
Cyanocobalamin, Vitamin B12 injection What is this medicine? CYANOCOBALAMIN (sye an oh koe BAL a min) is a man made form of vitamin B12. Vitamin B12 is used in the growth of healthy blood cells, nerve cells, and proteins in the body. It also helps with the metabolism of fats and carbohydrates. This medicine is used to treat people who can not absorb vitamin B12. This medicine may be used for other purposes; ask your health care provider or pharmacist if you have questions. COMMON BRAND NAME(S): B-12 Compliance Kit, B-12 Injection Kit, Cyomin, LA-12, Nutri-Twelve, Physicians EZ Use B-12, Primabalt What should I tell my health care provider before I take this medicine? They need to know if you have any of these conditions: -kidney disease -Leber's disease -megaloblastic anemia -an unusual or allergic reaction to cyanocobalamin, cobalt, other medicines, foods, dyes, or preservatives -pregnant or trying to get pregnant -breast-feeding How should I use this medicine? This medicine is injected into a muscle or deeply under the skin. It is usually given by a health care professional in a clinic or doctor's office. However, your doctor may teach you how to inject yourself. Follow all instructions. Talk to your pediatrician regarding the use of this medicine in children. Special care may be needed. Overdosage: If you think you have taken too much of this medicine contact a poison control center or emergency room at once. NOTE: This medicine is only for you. Do not share this medicine with others. What if I miss a dose? If you are given your dose at a clinic or doctor's office, call to reschedule your appointment. If you give your own injections and you miss a dose, take it as soon as you can. If it is almost time for your next dose, take only that dose. Do not take double or extra doses. What may interact with this medicine? -colchicine -heavy alcohol intake This list may not describe all possible  interactions. Give your health care provider a list of all the medicines, herbs, non-prescription drugs, or dietary supplements you use. Also tell them if you smoke, drink alcohol, or use illegal drugs. Some items may interact with your medicine. What should I watch for while using this medicine? Visit your doctor or health care professional regularly. You may need blood work done while you are taking this medicine. You may need to follow a special diet. Talk to your doctor. Limit your alcohol intake and avoid smoking to get the best benefit. What side effects may I notice from receiving this medicine? Side effects that you should report to your doctor or health care professional as soon as possible: -allergic reactions like skin rash, itching or hives, swelling of the face, lips, or tongue -blue tint to skin -chest tightness, pain -difficulty breathing, wheezing -dizziness -red, swollen painful area on the leg Side effects that usually do not require medical attention (report to your doctor or health care professional if they continue or are bothersome): -diarrhea -headache This list may not describe all possible side effects. Call your doctor for medical advice about side effects. You may report side effects to FDA at 1-800-FDA-1088. Where should I keep my medicine? Keep out of the reach of children. Store at room temperature between 15 and 30 degrees C (59 and 85 degrees F). Protect from light. Throw away any unused medicine after the expiration date. NOTE: This sheet is a summary. It may not cover all possible information. If you have questions about this medicine, talk to your doctor, pharmacist, or   health care provider.  2018 Elsevier/Gold Standard (2007-04-13 22:10:20)

## 2017-09-05 ENCOUNTER — Ambulatory Visit (INDEPENDENT_AMBULATORY_CARE_PROVIDER_SITE_OTHER)
Admission: RE | Admit: 2017-09-05 | Discharge: 2017-09-05 | Disposition: A | Discharge status: Home / Self Care | Payer: Medicare Other | Source: Ambulatory Visit | Attending: Internal Medicine | Admitting: Internal Medicine

## 2017-09-05 ENCOUNTER — Ambulatory Visit (INDEPENDENT_AMBULATORY_CARE_PROVIDER_SITE_OTHER): Payer: Medicare Other | Admitting: Internal Medicine

## 2017-09-05 ENCOUNTER — Encounter: Payer: Self-pay | Admitting: Internal Medicine

## 2017-09-05 ENCOUNTER — Encounter: Payer: Medicare Other | Admitting: *Deleted

## 2017-09-05 VITALS — BP 110/64 | HR 69 | Ht <= 58 in | Wt 97.4 lb

## 2017-09-05 DIAGNOSIS — J84112 Idiopathic pulmonary fibrosis: Secondary | ICD-10-CM | POA: Diagnosis not present

## 2017-09-05 DIAGNOSIS — Z006 Encounter for examination for normal comparison and control in clinical research program: Secondary | ICD-10-CM

## 2017-09-05 DIAGNOSIS — R0989 Other specified symptoms and signs involving the circulatory and respiratory systems: Secondary | ICD-10-CM | POA: Diagnosis not present

## 2017-09-05 DIAGNOSIS — R634 Abnormal weight loss: Secondary | ICD-10-CM

## 2017-09-05 DIAGNOSIS — I2510 Atherosclerotic heart disease of native coronary artery without angina pectoris: Secondary | ICD-10-CM | POA: Diagnosis not present

## 2017-09-05 DIAGNOSIS — N390 Urinary tract infection, site not specified: Secondary | ICD-10-CM

## 2017-09-05 DIAGNOSIS — J9312 Secondary spontaneous pneumothorax: Secondary | ICD-10-CM

## 2017-09-05 NOTE — Progress Notes (Signed)
IPF PRO Registry Purpose: To collect data and biological samples that will support future research studies. Registry will describe current approaches to diagnosis and treatment of IPF, analyze participant characteristics to describe the natural history of the disease, assess the quality of life, describe participant interactions with the health care system, describe IPF treatment practices across multiple institutions, and utilize biological samples linked to well characterized IPF participants to identify disease biomarkers.  Clinical Research Coordinator / Research RN note : This visit for Subject [name] with DOB: November 17, 1925 on 09/05/2017 for the above protocol is Visit/Encounter 12 month follow up  and is for purpose of research . The consent for this encounter is under Protocol Version dated February 14, 2015 and  is currently IRB approved. The subject expressed continued interest and consent in continuing as a study subject. Subject confirmed that there was  No change in contact information (e.g. address, telephone, email). Subject thanked for participation in research and contribution to science.   In this visit 09/05/2017 the subject will be evaluated by investigator named Dr. Chase Caller  . This research coordinator has verified that the investigator is up to date with his training  In today's visit the subject presents in office accompanied by her daughter. The subject appeared to be quite fatigued and she states that she has not been feeling well. I reintroduced myself to both the subject and daughter and mentioned that she was indeed due for her study follow up visit. The subject explained to me that she was sorry but she just did not feel up to filling out the PROs questionnaires nor did she feel she had the strength to give the blood work. She stated that she did not want to provide blood samples or complete the questionnaires. The subject did however express continued interest in being a study  participant however she just could not agree to the physical requirements of the protocol. The subject was thanked for her time. The study coordinator will conduct  All other required actions as per above stated protocol and will atempt to collect PROs and Blood samples as subjects next scheduled study window. For further documentation on today's visit please refer to the subjects paper source binder.    Signed by  T. Early Chars, BS, CPhT, [place], [name] 10:39 AM 09/05/2017

## 2017-09-05 NOTE — Addendum Note (Signed)
Addended by: Elton Sin on: 09/05/2017 11:16 AM   Modules accepted: Orders

## 2017-09-05 NOTE — Patient Instructions (Addendum)
IPF (idiopathic pulmonary fibrosis) ([facility]) - stable -- contnue supportive care   Secondary spontaneous pneumothorax - resolved on cxr may 2019 - recheck cxr 2 view 09/05/2017   Weight loss - unclear to me. Doubt related to IPF. Please talk to PCP Chipper Herb, MD   Recurrent UTI -  Never take macrobid - However, ask PCP Chipper Herb, MD if OTC product called D-Manose might work  [place] 9 months or sooner if needed

## 2017-09-05 NOTE — Progress Notes (Signed)
Subjective:     Patient ID: [name], female   DOB: 01/06/1926, 84 y.o.   MRN: [id number]  HPI  PCP Redge Gainer, MD   IOV 07/07/2015  Chief Complaint  Patient presents with  . Advice Only    Referred by Dr. Laurance Flatten for pulmonary nodule.       84 year old female accompanied by her daughter [name]. Referred for pulmonary nodule. According to the daughter and the patient will Sr. he is not fully clear with a long-standing history of pulmonary nodule that has been followed locally for many years review of the chart shows that she's had CT scan of the chest for many years. The CT scans of the chest. All the way back to 2004/2006 and described pulmonary fibrosis and nodule. I only have the images from 2014 that shows bilateral upper lobe pulmonary fibrosis with some involvement in the lower lobes. Non-UIP pattern. Most recently she had CT scan of the chest 06/30/2015. According to the daughter ration had some infectious symptoms at this time but again the CT was being done to evaluate for follow-up nodule and a new nodule was discovered which is described to be in the right upper lobe 6 mm and therefore has been referred here. Currently patient is feeling well baseline which is just mild intermittent occasional cough and shortness of breath with wheezing for which she is on maintenance inhaler possibly Brio which helps. Overall quality of life is fine and she does not wish for aggressive intervention.  I personally visualized the CT chest below. In terms of pulmonary fibrosis she is unaware of the diagnosis. Inour chart she's not had any autoimmune workup. She does live in an extremely old house over 21 years old. They think there might be some mold in it. She has chronic arthralgia but no diagnosed autoimmune disease   CT chest 06/30/15 EXAM: CT CHEST WITHOUT CONTRAST  TECHNIQUE: Multidetector CT imaging of the chest was performed following the standard protocol without IV  contrast.  COMPARISON: Chest radiograph 08/29/2015  FINDINGS: Mediastinum/Lymph Nodes: There are multiple borderline enlarged mediastinal lymph nodes, with reactive appearance. The heart is mildly enlarged. There is no pericardial effusion. Calcified atherosclerotic disease of the coronary arteries is seen.  Lungs/Pleura: There is no evidence of pneumothorax, pleural effusion or acute airspace consolidation. There are subpleural with upper lobe predominance fibrotic changes of the lungs bilaterally with coarsening of the interstitial markings, mild honeycombing and traction bronchiectasis. An area of scarring versus pulmonary nodule is seen in the subpleural right upper lobe and measures 6.4 mm (image 40/148, sequence 3). Mild pleural thickening is noted anteriorly at the level of the right middle lobe/ lingula.  Upper abdomen: No acute findings.  Musculoskeletal: No chest wall mass or suspicious bone lesions identified.  IMPRESSION: Upper lobe predominant chronic interstitial lung disease.  Multiple borderline enlarged mediastinal lymph nodes.  Differential diagnosis includes ankylosing spondylitis, sarcoidosis, silicosis, eosinophilic granuloma or prior granulomatous infection such as tuberculosis.  6.4 mm right upper lobe pulmonary nodule versus area of prominent scarring. Initial follow-up with CT at 6-12 months is recommended to confirm persistence. If persistent, repeat CT is recommended every 2 years until 5 years of stability has been established. This recommendation follows the consensus statement: Guidelines for Management of Incidental Pulmonary Nodules Detected on CT Images:From the Fleischner Society 2017; published online before print (10.1148/radiol.[id number]).   Electronically Signed  By: Fidela Salisbury M.D.  On: 06/30/2015 17:20  OV 02/15/2016  Chief Complaint  Patient presents with  .  Follow-up    Pt here after CT chest. Pt  states her breathing is unchanged since last OV. Pt denies cough and CP/tightness.    Follow-up 6 mm right upper lobe nodule  In follow-up interstitial lung disease not otherwise specified base in June 2017 CT chest  She had follow-up CT chest recommended below that at personally visualized with her and her daughter [name]. According to me the right upper lobe nodule is stable and I agree with the report. The interstitial lung disease pattern appears to be one of possible UIP. She has an elevated rheumatoid factor but negative CCP. Did tell me that they saw Dr. Trudie Reed rheumatologist and has been reassured that there is no systemic evidence of rheumatoid arthritis. I do not have the chart with me. Overall she's stable but does get dyspneic doing gardening and then relieved by rest and it is no chest pain. She wants to continue to be as physically active as possible she is not interested in pulmonary rehabilitation.  Of note I did review her CT chest in 2013 and 2017. Today I think ILD slowly progressive IMPRESSION: 1. Stable CT of the chest. 2. Similar appearance of chronic interstitial lung disease compatible with pulmonary fibrosis. This may be secondary to nonspecific interstitial pneumonia or usual interstitial pneumonia (UIP.) 3. Small pulmonary nodules are again noted and appears stable from 06/30/2015. Non-contrast chest CT can be considered at 12 months (from 07/01/2015) if patient is high-risk. This recommendation follows the consensus statement: Guidelines for Management of Incidental Pulmonary Nodules Detected on CT Images: From the Fleischner Society 2017; Radiology 2017; [phone]. 4.   Electronically Signed   By: Kerby Moors M.D.   On: 12/19/2015 14:54  OV 08/09/2016  Chief Complaint  Patient presents with  . Follow-up    Pt states her breathing is unchanged sicne last OV. Pt states she has a dry cough. Pt denies CP/tightness and f/c/s.    Follow-up idiopathic  pulmonary fibrosis diagnosis based based on age, possible UIP on CT scan and negative rheumatology evaluation. Diagnosis given 02/15/2016  Follow-up lung nodule  Overall she's doing stable. Today in the office she walked coronary 5 feet 3 laps on room air: She was unable to do  and then stopped because of hip pain and knee pain is chronic issues. She continues to live alone and her daughter [name] is with her today. She is functional in the house able to gardening but then stopped because of dyspnea. She does have some wheeze for which she takes Brio although there is no emphysema on the CT scan.   OV 10/03/2016    Chief Complaint  Patient presents with  . Follow-up    Pt attempted to do the PFT but was unable to complete. Denies any comlaints or concerns. Occ. SOB. Denies any CP or cough.    Follow-up IPF on basic supportive care. Last saw her in June 2018. At this point now she is stable. We will gardening. She complains of dyspnea on exertion relieved by rest it is mild to moderate. It is not any worse. She could not do her PFTs today. In the past walked this also failed. I told her that we will just follow her symptoms. She is but is pitting in the IPF registry by BI . Daugher [name] with her  follow-up lung nodule: She has her next scans December 18   OV 02/03/2017   Chief Complaint  Patient presents with  . Follow-up    CT scan  done 12/16/16.     Pt states she has been doing good since last visit. Breathing is the same as it was at last visit. Pt still losing weight.    Follow-up idiopathic interstitial ILD: Clinical diagnosis of IPF given based on age and also presence o of traction bronchiectasis and also negative autoimmune profile.  Overall clinically fine.  She is doing gardening and cooking and doing activities of daily living without any chest pain or shortness of breath.  Her CT chest in dec 2018 shows prgression since 2012. However, asymptomatic and Walking desaturation  test on 02/03/2017 185 feet x 3 laps on ROOM AIR:  did NOT desaturate. Rest pulse ox was 100%, final pulse ox was 100%. HR response 74/min at rest to 83/min at peak exertion. Patient LAILAH MARCELLI  Did not Desaturate < 88% . Vaughan Basta did not  Desaturated </= 3% points. Vaughan Basta did not get tachyardic. OF note, she is in IPF Registry and she did  A visit with [facility] with blood test and questionnaire. She still is interested in continuing that   Lung nodule 6mm sable 2012 -> 2018   Ne finding  does have coronary artery calcification on CT - no chest pain. Prefers expectant followup   CT [facility] dec 2018 Lungs/Pleura: Extensive subpleural reticulation, interstitial coarsening and traction bronchiectasis/bronchiolectasis without a definite zonal predominance. Findings are progressive from 07/11/2010. 5 mm subpleural right lower lobe nodule (image 93), unchanged from 2012 and considered benign. No pleural fluid. Airway is unremarkable.  Upper Abdomen: Visualized portions of the liver, adrenal glands, kidneys, spleen, pancreas, stomach and bowel are grossly unremarkable.  Musculoskeletal: Degenerative changes in the spine. No worrisome lytic or sclerotic lesions.  IMPRESSION: 1. Fibrotic interstitial lung disease has progressed from 07/11/2010 and may be due to fibrotic nonspecific interstitial pneumonitis. Distribution is considered somewhat non typical for usual interstitial pneumonitis given areas of more central interstitial involvement as well as lack of a strong craniocaudal gradient, but is not excluded. 2. Aortic atherosclerosis (ICD10-170.0). Severe coronary artery calcification. 3. Enlarged pulmonary arteries, indicative of pulmonary arterial hypertension.   Electronically Signed   By: Lorin Picket M.D.   On: 12/16/2016 12:18   03/24/2017 Follow up : IPF , ER follow up  Patient presents for a follow-up visit.  She was recently seen in the emergency  room at [facility] 2 weeks ago.  She had increased shortness of breath and chest pain. Says ER workup was reported neg for cardiac. CT chest showed no PE , stable chronic changes.  EKG was reported as Left BBB.  No further episodes.  No FH of CAD. No HTN . + hyperlipidemia on statin .    Patient has underlying IPF, clinical diagnosis, has been essentially stable.  On supportive care.  Most recent CT chest in December 2018 show some progression since 2012 but overall felt to be clinically stable. Remains active , does all housework and gardening . Does not drive.(has never driven)   Continues to have weight loss. On ensure. Says PCP has worked up but has not found an  OV 05/27/2017  Chief Complaint  Patient presents with  . Follow-up    Pt had a spontaneous pneumothorax per pt's daughter.  Pt went to ER 02/2017 due to CP and was thinking it was angina and went back to ER 04/2017 with the same pain in chest and pain ended up being due to a pneumothorax.  Since the ER visit when pt  was given pain meds, she has been doing good.   Follow-up idiopathic pulmonary fibrosis [clinical diagnosis] basic supportive care   Routine follow-up for this IPF patient.  Last seen by myself in January 2019.  Then in February 2019 ended up in [facility] emergency department for chest pains.  She followed up with nurse practitioner here in April 2019.  Subsequent to that visit on May 04, 2017 she developed another episode of left-sided chest pain went back to [facility] emergency department.  PE ruled out.  Had a CT scan of the chest that I personally visualized and it showed a small less than 5% apical pneumothorax and in retrospect this was also present in the February 2019 CT chest although it definitely has progressed in April 2019.  I personally visualized and confirmed these findings.  The daughter has a stack of records with her that I personally reviewed.  These are from the [facility] emergency department and  basically confirmed this.  The treatment elected was observation.  At this point in time patient and her daughter report that the patient is functional without any symptoms.  She is does gardening.  The main issue is that she is continuing to lose weight.  Walking desaturation test in our office today as documented below and was essentially normal.   OV 09/05/2017  Chief Complaint  Patient presents with  . Follow-up    Pt states she has been fatigued since last visit but other than that she has been doing good. Pt states she has had some mild SOB, and an occ cough.   Follow-up idiopathic pulmonary fibrosis in this frail elderly woman. She presents with her daughter for routine follow-up. Today she was supposed to do the IPF registry questionnaire and blood test but she cited fatigue and reluctance to do it but she still wants to continue with the study. There are no interim complaints other than fatigue that is ongoing and weight loss but when we walked her oxygen level stayed normal as documented below. She is able to do her activities of daily living but just that she is more fatigued.Daughter says primary can physician has investigated this and so far unable to find an etiology. However there is no deterioration in her IPF. Daughter is beginning to suspect of cephalexin given for recurrent UTI is the cause. Patient is not on nitrofurantoin. Earlier this year she had secondary spontaneous pneumothorax that was managed expectantly and a chest x-ray with [place] in May 2019 did not show any pneumothorax. They're willing to have another chest x-ray    Simple office walk 185 feet x  3 laps goal with forehead probe 05/27/2017  09/05/2017   O2 used Room air Room air  Number laps completed 3 3  Comments about pace ver slow but independent   Resting Pulse Ox/HR 100% and 65/min 98% and HR 73/min  Final Pulse Ox/HR 99% and 88/min 98% and 84/min  Desaturated </= 88% no no  Desaturated <= 3% points no no  Got  Tachycardic >/= 90/min  no  Symptoms at end of test Mild fatigute and dyspne   Miscellaneous comments stable         has a past medical history of Arthritis, Cataract, Chronic back pain, Glaucoma, Hyperlipidemia, Idiopathic pulmonary fibrosis (HCC), Lumbar stenosis, Osteoarthrosis and allied disorders, Osteoporosis, Peripheral neuropathy, and Vitamin D deficiency.   reports that she has never smoked. She has never used smokeless tobacco.  Past Surgical History:  Procedure Laterality Date  . ABDOMINAL  HYSTERECTOMY    . CATARACT EXTRACTION    . Cataract surgery Bilateral   . Eyelid surgery    . KNEE ARTHROSCOPY    . LUMBAR LAMINECTOMY      Allergies  Allergen Reactions  . Mucinex [Guaifenesin Er] Other (See Comments)    Causes anxiety  . Levaquin [Levaquin Leva-Pak]     Patient cannot remember that she is sensitive to this medication; has never heard of it.  . Erythromycin Diarrhea  . Penicillins Rash    Immunization History  Administered Date(s) Administered  . Influenza Split 10/28/2012  . Influenza Whole 09/14/2009  . Influenza, High Dose Seasonal PF 10/07/2016  . Influenza, Quadrivalent, Recombinant, Inj, Pf 11/08/2015  . Influenza,inj,Quad PF,6+ Mos 10/24/2014  . Influenza,inj,quad, With Preservative 11/10/2013  . Pneumococcal Conjugate-13 12/31/2012  . Pneumococcal Polysaccharide-23 10/14/1996  . Td 10/15/2002  . Zoster 02/05/2010    Family History  Problem Relation Age of Onset  . Bone cancer Mother   . Breast cancer Sister   . Diabetes Brother   . Asthma Daughter   . COPD Son   . Asthma Son      Current Outpatient Medications:  .  albuterol (VENTOLIN HFA) 108 (90 Base) MCG/ACT inhaler, 2 puffs every 4-6 hours as needed for wheezing, shortness of breath, Disp: , Rfl:  .  alendronate (FOSAMAX) 70 MG tablet, TAKE 1 TABLET WEEKLY (TAKE WITH 8OZ OF WATER 30 MINUTES BEFORE BREAKFAST), Disp: 12 tablet, Rfl: 2 .  aspirin EC 81 MG tablet, Take 1 tablet (81 mg  total) by mouth daily., Disp: 90 tablet, Rfl: 3 .  BREO ELLIPTA 100-25 MCG/INH AEPB, USE 1 INHALATION DAILY, Disp: 60 each, Rfl: 5 .  calcium carbonate (OS-CAL) 600 MG TABS tablet, Take 600 mg by mouth daily with breakfast., Disp: , Rfl:  .  cephALEXin (KEFLEX) 500 MG capsule, Take 1 capsule by mouth 3 (three) times daily. X 1 week - then 1 nightly (dr Jeffie Pollock), Disp: , Rfl:  .  cholecalciferol (VITAMIN D) 1000 UNITS tablet, Take 1,000 Units by mouth daily., Disp: , Rfl:  .  fluticasone (FLONASE) 50 MCG/ACT nasal spray, USE 2 SPRAYS IN EACH NOSTRIL DAILY, Disp: 16 g, Rfl: 11 .  ibuprofen (ADVIL,MOTRIN) 200 MG tablet, Take 200 mg by mouth every 6 (six) hours as needed., Disp: , Rfl:  .  Multiple Vitamins-Minerals (CENTRUM SILVER) tablet, Take 1 tablet by mouth every evening., Disp: , Rfl:  .  ranitidine (ZANTAC) 150 MG tablet, Take 150 mg by mouth 2 (two) times daily., Disp: , Rfl:  .  simvastatin (ZOCOR) 40 MG tablet, Take 1 tablet (40 mg total) by mouth daily., Disp: 90 tablet, Rfl: 0 .  nitroGLYCERIN (NITROSTAT) 0.4 MG SL tablet, Place 1 tablet (0.4 mg total) under the tongue every 5 (five) minutes as needed for chest pain., Disp: 25 tablet, Rfl: 3  Current Facility-Administered Medications:  .  cyanocobalamin ((VITAMIN B-12)) injection 1,000 mcg, 1,000 mcg, Intramuscular, Q30 days, Chipper Herb, MD, 1,000 mcg at 09/01/17 1010   Review of Systems     Objective:   Physical Exam Vitals:   09/05/17 1015  BP: 110/64  Pulse: 69  SpO2: 98%  Weight: 97 lb 6.4 oz (44.2 kg)  Height: 4' 9.25" (1.454 m)    Estimated body mass index is 20.89 kg/m as calculated from the following:   Height as of this encounter: 4' 9.25" (1.454 m).   Weight as of this encounter: 97 lb 6.4 oz (44.2 kg).  General Appearance:    Frail, alert, looks same, walked well for walk test  Head:    Normocephalic, without obvious abnormality, atraumatic  Eyes:    PERRL - yes, conjunctiva/corneas - clear      Ears:     Normal external ear canals, both ears  Nose:   NG tube - no  Throat:  ETT TUBE - no , OG tube - no  Neck:   Supple,  No enlargement/tenderness/nodules     Lungs:     Clear to auscultation bilaterally overall with some scattered  crackles  Chest wall:    No deformity  Heart:    S1 and S2 normal, no murmur, CVP - no.  Pressors - no  Abdomen:     Soft, no masses, no organomegaly  Genitalia:    Not done  Rectal:   not done  Extremities:   Extremities- intact     Skin:   Intact in exposed areas      Neurologic:   Sedation - none -> RASS - +1 . Moves all 4s - yes. CAM-ICU - neg . Orientation - x3+         Assessment:       ICD-10-CM   1. IPF (idiopathic pulmonary fibrosis) ([place]) J84.112   2. Secondary spontaneous pneumothorax J93.12   3. Weight loss R63.4   4. Recurrent UTI N39.0        Plan:     IPF (idiopathic pulmonary fibrosis) ([place]) - stable -- contnue supportive care   Secondary spontaneous pneumothorax - resolved on cxr may 2019 - recheck cxr 2 view 09/05/2017   Weight loss - unclear to me. Doubt related to IPF. Please talk to PCP Chipper Herb, MD   Recurrent UTI -  Never take macrobid - However, ask PCP Chipper Herb, MD if OTC product called D-Manose might work  [place] 9 months or sooner if needed    Dr. Brand Males, M.D., [facility].C.P Pulmonary and Critical Care Medicine Staff Physician, [place] Director - Interstitial Lung Disease  Program  Pulmonary [place] at [place], [name], [address]  Pager: [phone], If no answer or between  15:00h - 7:00h: call [phone] Telephone: [phone]

## 2017-09-10 ENCOUNTER — Telehealth: Payer: Self-pay | Admitting: Internal Medicine

## 2017-09-10 NOTE — Telephone Encounter (Signed)
ATC call patient's daughter back. No one answered and I could not leave a message. Will attempt to call back later.

## 2017-09-11 NOTE — Telephone Encounter (Signed)
Pt's daughter is calling back [phone]

## 2017-09-11 NOTE — Telephone Encounter (Signed)
Attempted to contact pt's daughter, [name]. I did not receive an answer. There was no option for me to leave a message. Will try back.

## 2017-09-11 NOTE — Telephone Encounter (Signed)
Spoke with [name] wanted to clarify that her mom has pulmonary fibrosis and not cystic fibrosis. [name] was reassured per MD Ramaswamy's notes patient has Pulmonary Fibrosis. Voiced understanding. Nothing further needed at this time.

## 2017-09-19 ENCOUNTER — Ambulatory Visit (INDEPENDENT_AMBULATORY_CARE_PROVIDER_SITE_OTHER): Payer: Medicare Other | Admitting: Urology

## 2017-09-19 ENCOUNTER — Other Ambulatory Visit (HOSPITAL_COMMUNITY)
Admission: RE | Admit: 2017-09-19 | Discharge: 2017-09-19 | Disposition: A | Discharge status: Home / Self Care | Payer: Medicare Other | Source: Other Acute Inpatient Hospital | Attending: Urology | Admitting: Urology

## 2017-09-19 DIAGNOSIS — N3946 Mixed incontinence: Secondary | ICD-10-CM

## 2017-09-19 DIAGNOSIS — N3020 Other chronic cystitis without hematuria: Secondary | ICD-10-CM | POA: Insufficient documentation

## 2017-09-21 LAB — URINE CULTURE

## 2017-10-03 ENCOUNTER — Encounter: Payer: Self-pay | Admitting: *Deleted

## 2017-10-03 ENCOUNTER — Ambulatory Visit (INDEPENDENT_AMBULATORY_CARE_PROVIDER_SITE_OTHER): Payer: Medicare Other

## 2017-10-03 ENCOUNTER — Ambulatory Visit (INDEPENDENT_AMBULATORY_CARE_PROVIDER_SITE_OTHER): Payer: Medicare Other | Admitting: *Deleted

## 2017-10-03 VITALS — BP 143/71 | HR 67 | Ht <= 58 in | Wt 95.0 lb

## 2017-10-03 DIAGNOSIS — M810 Age-related osteoporosis without current pathological fracture: Secondary | ICD-10-CM

## 2017-10-03 DIAGNOSIS — E538 Deficiency of other specified B group vitamins: Secondary | ICD-10-CM | POA: Diagnosis not present

## 2017-10-03 DIAGNOSIS — Z0000 Encounter for general adult medical examination without abnormal findings: Secondary | ICD-10-CM | POA: Diagnosis not present

## 2017-10-03 NOTE — Progress Notes (Addendum)
Subjective:   [name] is a 84 y.o. female who presents for a Medicare Annual Wellness Visit. [name] lives at home alone. She has 3 daughters and 2 sons. Oldest son lives in [place] and others live locally. Her daughters take her to grocery store and help her out. Her husband passed away 30 years ago and she has been living by herself since then. She feels like she does well. Her grandson and grandson-in-law help to mow her yard. She enjoys reading and doing puzzles. She also stays very active around her home.  She cans fruits and vegetables that she grows and she makes quilts. She cooks a big meal on Saturday for lunch on Sunday. Everyone comes over to eat. Her daughter also comes over on Tuesdays to eat.    Review of Systems    Patient reports that her overall health is unchanged compared to last year.  Cardiac Risk Factors include: advanced age (>26men, >82 women);dyslipidemia  Pulmonary: episodes of SOB due to pulmonary fibrosis  All other systems negative       Current Medications (verified) Outpatient Encounter Medications as of 10/03/2017  Medication Sig  . albuterol (VENTOLIN HFA) 108 (90 Base) MCG/ACT inhaler 2 puffs every 4-6 hours as needed for wheezing, shortness of breath  . aspirin EC 81 MG tablet Take 1 tablet (81 mg total) by mouth daily.  Marland Kitchen BREO ELLIPTA 100-25 MCG/INH AEPB USE 1 INHALATION DAILY  . calcium carbonate (OS-CAL) 600 MG TABS tablet Take 600 mg by mouth daily with breakfast.  . cephALEXin (KEFLEX) 500 MG capsule Take 1 capsule by mouth 3 (three) times daily. X 1 week - then 1 nightly (dr Jeffie Pollock)  . cholecalciferol (VITAMIN D) 1000 UNITS tablet Take 1,000 Units by mouth daily.  . fluticasone (FLONASE) 50 MCG/ACT nasal spray USE 2 SPRAYS IN EACH NOSTRIL DAILY  . ibuprofen (ADVIL,MOTRIN) 200 MG tablet Take 200 mg by mouth every 6 (six) hours as needed.  . Multiple Vitamins-Minerals (CENTRUM SILVER) tablet Take 1 tablet by mouth every evening.  .  nitroGLYCERIN (NITROSTAT) 0.4 MG SL tablet Place 1 tablet (0.4 mg total) under the tongue every 5 (five) minutes as needed for chest pain.  . ranitidine (ZANTAC) 150 MG tablet Take 150 mg by mouth 2 (two) times daily.  . [DISCONTINUED] alendronate (FOSAMAX) 70 MG tablet TAKE 1 TABLET WEEKLY (TAKE WITH 8OZ OF WATER 30 MINUTES BEFORE BREAKFAST)  . [DISCONTINUED] simvastatin (ZOCOR) 40 MG tablet Take 1 tablet (40 mg total) by mouth daily.   Facility-Administered Encounter Medications as of 10/03/2017  Medication  . cyanocobalamin ((VITAMIN B-12)) injection 1,000 mcg    Allergies (verified) Mucinex [guaifenesin er]; Levaquin [levaquin leva-pak]; Erythromycin; and Penicillins   History: Past Medical History:  Diagnosis Date  . Arthritis   . Cataract   . Chronic back pain   . Glaucoma   . Hyperlipidemia   . Idiopathic pulmonary fibrosis ([place])   . Lumbar stenosis   . Osteoarthrosis and allied disorders   . Osteoporosis   . Peripheral neuropathy   . Vitamin D deficiency    Past Surgical History:  Procedure Laterality Date  . ABDOMINAL HYSTERECTOMY    . CATARACT EXTRACTION    . Cataract surgery Bilateral   . Eyelid surgery    . KNEE ARTHROSCOPY    . LUMBAR LAMINECTOMY     Family History  Problem Relation Age of Onset  . Bone cancer Mother   . Breast cancer Sister   . Diabetes Brother   .  Asthma Daughter   . COPD Son   . Asthma Son    Social History   Socioeconomic History  . Marital status: Widowed    Spouse name: Not on file  . Number of children: Not on file  . Years of education: 5  . Highest education level: Not on file  Occupational History  . Not on file  Social Needs  . Financial resource strain: Not hard at all  . Food insecurity:    Worry: Never true    Inability: Never true  . Transportation needs:    Medical: No    Non-medical: No  Tobacco Use  . Smoking status: Never Smoker  . Smokeless tobacco: Never Used  Substance and Sexual Activity  . Alcohol  use: No    Alcohol/week: 0.0 standard drinks  . Drug use: No  . Sexual activity: Not Currently    Birth control/protection: Post-menopausal  Lifestyle  . Physical activity:    Days per week: 7 days    Minutes per session: 30 min  . Stress: Not at all  Relationships  . Social connections:    Talks on phone: More than three times a week    Gets together: More than three times a week    Attends religious service: More than 4 times per year    Active member of club or organization: Yes    Attends meetings of clubs or organizations: More than 4 times per year    Relationship status: Widowed  Other Topics Concern  . Not on file  Social History Narrative  . Not on file    Tobacco Use No.  Activities of Daily Living In your present state of health, do you have any difficulty performing the following activities: 10/03/2017  Hearing? N  Vision? N  Comment recent eye exam  Difficulty concentrating or making decisions? N  Walking or climbing stairs? N  Dressing or bathing? N  Doing errands, shopping? N  Comment daughters take her shopping  Preparing Food and eating ? N  Using the Toilet? N  In the past six months, have you accidently leaked urine? N  Do you have problems with loss of bowel control? N  Managing your Medications? N  Managing your Finances? N  Housekeeping or managing your Housekeeping? N  Some recent data might be hidden     Diet Breakfast-Oatmeal Lunch-sandwich  Supper-prepares home prepared meals [employer] drinks Water, pepsi, and poweraid  Exercise Current Exercise Habits: Home exercise routine(stays busy around home, yard, and garden), Type of exercise: walking, Time (Minutes): 30, Frequency (Times/Week): 7, Weekly Exercise (Minutes/Week): 210, Exercise limited by: Other - see comments(advanced age)   Depression Screen PHQ 2/9 Scores 10/08/2017 10/03/2017 05/28/2017 01/24/2017 08/22/2016 07/23/2016 04/10/2016  PHQ - 2 Score 0 0 0 0 0 0 0     Fall  Risk Fall Risk  10/08/2017 10/03/2017 05/28/2017 01/24/2017 08/22/2016  Falls in the past year? No No No No No  Number falls in past yr: - - - - -  Injury with Fall? - - - - -  Risk for fall due to : - - - - -  Follow up - - - - -    Safety Is the patient's home free of loose throw rugs in walkways, pet beds, electrical cords, etc?   yes      Grab bars in the bathroom? no      Walkin shower? no      Shower Seat? no  Handrails on the stairs?   yes . Main living area is on the ground floor      Adequate lighting?   yes  Patient Care Team: Chipper Herb, MD as PCP - General (Family Medicine) Satira Sark, MD as PCP - Cardiology (Cardiology) Ashok Pall, MD as Consulting Physician (Neurosurgery) Daryll Brod, MD as Consulting Physician (Orthopedic Surgery) Brand Males, MD as Consulting Physician (Pulmonary Disease) Irine Seal, MD as Attending Physician (Urology) Melina Schools, OD (Optometry)  Hospitalizations, surgeries, and ER visits in previous 12 months Reports two ED visits for SOB. No other hospitalizations, surgeries, or ER visits.  Objective:    Today's Vitals   10/03/17 1154  BP: (!) 143/71  Pulse: 67  Weight: 95 lb (43.1 kg)  Height: 4\' 9"  (1.448 m)   Body mass index is 20.56 kg/m.  Advanced Directives 07/23/2016 02/09/2014  Does Patient Have a Medical Advance Directive? Yes Yes  Type of Paramedic of [place];Living will Living will;Healthcare Power of Attorney  Does patient want to make changes to medical advance directive? No - Patient declined -  Copy of [place] in Chart? No - copy requested No - copy requested    Hearing/Vision  No hearing or vision deficits noted during visit.  Cognitive Function: MMSE - Mini Mental State Exam 10/03/2017 07/23/2016 07/23/2016  Orientation to time 5 5 5   Orientation to Place 5 5 5   Registration 3 3 3   Attention/ Calculation 3 5 5   Recall 1 1 1   Language- name 2  objects 2 2 2   Language- repeat 1 0 0  Language- follow 3 step command 3 3 3   Language- read & follow direction 1 1 1   Write a sentence 1 1 1   Copy design 0 0 -  Total score 25 26 -       Normal Cognitive Function Screening: Yes-stable    Immunizations and Health Maintenance Immunization History  Administered Date(s) Administered  . Influenza Split 10/28/2012  . Influenza Whole 09/14/2009  . Influenza, High Dose Seasonal PF 10/07/2016  . Influenza, Quadrivalent, Recombinant, Inj, Pf 11/08/2015  . Influenza,inj,Quad PF,6+ Mos 10/24/2014  . Influenza,inj,quad, With Preservative 11/10/2013  . Pneumococcal Conjugate-13 12/31/2012  . Pneumococcal Polysaccharide-23 10/14/1996  . Td 10/15/2002  . Zoster 02/05/2010   Health Maintenance Due  Topic Date Due  . INFLUENZA VACCINE  08/14/2017   Health Maintenance  Topic Date Due  . INFLUENZA VACCINE  08/14/2017  . MAMMOGRAM  11/08/2018  . TETANUS/TDAP  03/14/2020  . DEXA SCAN  Completed  . PNA vac Low Risk Adult  Completed        Assessment:   This is a routine wellness examination for Tarita.    Plan:    Goals    . Exercise 150 minutes per week (moderate activity)     Continue to stay active for at least 30 minutes daily         Health Maintenance Recommendations: Influenza vaccine  Dexa scan   Additional Screening Recommendations: Lung: Low Dose CT Chest recommended if Age 61-80 years, 30 pack-year currently smoking OR have quit w/in 15years. Patient does not qualify. Hepatitis C Screening recommended: no  Today's Orders Orders Placed This Encounter  Procedures  . DG [facility] DEXA    Order Specific Question:   Reason for Exam (SYMPTOM  OR DIAGNOSIS REQUIRED)    Answer:   osteoporosis    Keep f/u with Chipper Herb, MD and any other specialty  appointments you may have Continue current medications Move carefully to avoid falls. Use assistive devices like a cane or walker if needed. Aim for at least 150  minutes of moderate activity a week. This can be done with chair exercises if necessary. Read or work on puzzles daily Stay connected with friends and family  I have personally reviewed and noted the following in the patient's chart:   . Medical and social history . Use of alcohol, tobacco or illicit drugs  . Current medications and supplements . Functional ability and status . Nutritional status . Physical activity . Advanced directives . List of other physicians . Hospitalizations, surgeries, and ER visits in previous 12 months . Vitals . Screenings to include cognitive, depression, and falls . Referrals and appointments  In addition, I have reviewed and discussed with patient certain preventive protocols, quality metrics, and best practice recommendations. A written personalized care plan for preventive services as well as general preventive health recommendations were provided to patient.     Chong Sicilian, RN   10/03/2017   I have reviewed and agree with the above AWV documentation.   Arrie Senate MD

## 2017-10-03 NOTE — Patient Instructions (Addendum)
[name] , Thank you for taking time to come for your Medicare Wellness Visit. I appreciate your ongoing commitment to your health goals. Please review the following plan we discussed and let me know if I can assist you in the future.   These are the goals we discussed: Goals    . Exercise 150 minutes per week (moderate activity)     Continue to stay active for at least 30 minutes daily        This is a list of the screening recommended for you and due dates:  Health Maintenance  Topic Date Due  . Flu Shot  08/14/2017  . Mammogram  11/08/2018  . Tetanus Vaccine  03/14/2020  . DEXA scan (bone density measurement)  Completed  . Pneumonia vaccines  Completed   Serving Sizes A serving size is a measured amount of food or drink, such as one slice of bread, that has an associated nutrient content. Knowing the serving size of a food or drink can help you determine how much of that food you should consume. What is the size of one serving? The size of one healthy serving depends on the food or drink. To determine a serving size, read the food label. If the food or drink does not have a food label, try to find serving size information online. Or, use the following to estimate the size of one adult serving: Grain 1 slice bread.  bagel.  cup pasta. Vegetable  cup cooked or canned vegetables. 1 cup raw, leafy greens. Fruit  cup canned fruit. 1 medium fruit.  cup dried fruit. Meat and Other Protein Sources 1 oz meat, poultry, or fish.  cup cooked beans. 1 egg.  cup nuts or seeds. 1 Tbsp nut butter.  cup tofu or tempeh. 2 Tbsp hummus. Dairy An individual container of yogurt (6-8 oz). 1 piece of cheese the size of your thumb (1 oz). 1 cup (8 oz) milk or milk alternative. Fat A piece the size of one dice. 1 tsp soft margarine. 1 Tbsp mayonnaise. 1 tsp vegetable oil. 1 Tbsp regular salad dressing. 2 Tbsp low-fat salad dressing. How many servings should I eat from each food group each  day? The following are the suggested number of servings to try and have every day from each food group. You can also look at your eating throughout the week and aim for meeting these requirements on most days for overall healthy eating. Grain 6-8 servings. Try to have half of your grains from whole grains, such as whole wheat bread, corn tortillas, oatmeal, brown rice, whole wheat pasta, and bulgur. Vegetable At least 2-3 servings. Fruit 2 servings. Meat and Other Protein Foods 5-6 servings. Aim to have lean proteins, such as chicken, [place], fish, beans, or tofu. Dairy 3 servings. Choose low-fat or nonfat if you are trying to control your weight. Fat 2-3 servings. Is a serving the same thing as a portion? No. A portion is the actual amount you eat, which may be more than one serving. Knowing the specific serving size of a food and the nutritional information that goes with it can help you make a healthy decision on what size portion to eat. What are some tips to help me learn healthy serving sizes?  Check food labels for serving sizes. Many foods that come as a single portion actually contain multiple servings.  Determine the serving size of foods you commonly eat and figure out how large a portion you usually eat.  Measure the  number of servings that can be held by the bowls, glasses, cups, and plates you typically use. For example, pour your breakfast cereal into your regular bowl and then pour it into a measuring cup.  For 1-2 days, measure the serving sizes of all the foods you eat.  Practice estimating serving sizes and determining how big your portions should be. This information is not intended to replace advice given to you by your health care provider. Make sure you discuss any questions you have with your health care provider. Document Released: 09/29/2002 Document Revised: 08/26/2015 Document Reviewed: 03/30/2013 Elsevier Interactive Patient Education  [employer].

## 2017-10-06 ENCOUNTER — Ambulatory Visit: Payer: Medicare Other | Admitting: Family Medicine

## 2017-10-06 DIAGNOSIS — M810 Age-related osteoporosis without current pathological fracture: Secondary | ICD-10-CM | POA: Diagnosis not present

## 2017-10-08 ENCOUNTER — Encounter: Payer: Self-pay | Admitting: Family Medicine

## 2017-10-08 ENCOUNTER — Ambulatory Visit (INDEPENDENT_AMBULATORY_CARE_PROVIDER_SITE_OTHER): Payer: Medicare Other | Admitting: Family Medicine

## 2017-10-08 VITALS — BP 150/63 | HR 69 | Temp 98.1°F | Ht <= 58 in | Wt 96.5 lb

## 2017-10-08 DIAGNOSIS — J849 Interstitial pulmonary disease, unspecified: Secondary | ICD-10-CM | POA: Diagnosis not present

## 2017-10-08 DIAGNOSIS — E7800 Pure hypercholesterolemia, unspecified: Secondary | ICD-10-CM | POA: Diagnosis not present

## 2017-10-08 DIAGNOSIS — E538 Deficiency of other specified B group vitamins: Secondary | ICD-10-CM | POA: Diagnosis not present

## 2017-10-08 DIAGNOSIS — R634 Abnormal weight loss: Secondary | ICD-10-CM

## 2017-10-08 DIAGNOSIS — M810 Age-related osteoporosis without current pathological fracture: Secondary | ICD-10-CM

## 2017-10-08 DIAGNOSIS — J84112 Idiopathic pulmonary fibrosis: Secondary | ICD-10-CM

## 2017-10-08 DIAGNOSIS — M48061 Spinal stenosis, lumbar region without neurogenic claudication: Secondary | ICD-10-CM

## 2017-10-08 DIAGNOSIS — I2510 Atherosclerotic heart disease of native coronary artery without angina pectoris: Secondary | ICD-10-CM

## 2017-10-08 NOTE — Progress Notes (Signed)
Subjective:    Patient ID: [name], female    DOB: 01-Nov-1925, 84 y.o.   MRN: [id number]  HPI Pt is here today for a follow up of chronic medical conditions which include hyperlipidemia, GERD and  B12  deficiency.  She today is complaining with some sinus pressure.  She comes with one her daughters to the visit today.  The significant thing about this patient is that she has idiopathic pulmonary fibrosis.  She sees the pulmonologist regularly.  Also has spinal stenosis of the lumbar region and B12 deficiency and D deficient in vitamin D.  The patient today denies any chest pain.  She does have some shortness of breath with activity but not anything that bothers her.  She recently saw the pulmonologist and he thinks that things are stable with her.  She has had some constipation and tries to drink plenty of fluids.  She is passing her water without trouble.  She does complain of sinus congestion.  She has had some shots in her back from her neurosurgeon and this is helped control some of the pain.            Patient Active Problem List   Diagnosis Date Noted  . Atypical chest pain 03/24/2017  . Weight loss 03/24/2017  . IPF (idiopathic pulmonary fibrosis) ([place]) 08/09/2016  . Interstitial lung disease ([place]) 07/07/2015  . Nodule of right lung 07/07/2015  . Chronic cystitis 01/08/2013  . Vitamin D deficiency 12/31/2012  . Osteoporosis 12/31/2012  . Spinal stenosis of lumbar region 12/31/2012  . Hyperlipidemia 07/16/2012  . Chronic low back pain 07/16/2012  . Vitamin B12 deficient megaloblastic anemia 07/16/2012   Outpatient Encounter Medications as of 10/08/2017  Medication Sig  . albuterol (VENTOLIN HFA) 108 (90 Base) MCG/ACT inhaler 2 puffs every 4-6 hours as needed for wheezing, shortness of breath  . alendronate (FOSAMAX) 70 MG tablet TAKE 1 TABLET WEEKLY (TAKE WITH 8OZ OF WATER 30 MINUTES BEFORE BREAKFAST)  . aspirin EC 81 MG tablet Take 1 tablet (81 mg total) by mouth  daily.  Marland Kitchen BREO ELLIPTA 100-25 MCG/INH AEPB USE 1 INHALATION DAILY  . calcium carbonate (OS-CAL) 600 MG TABS tablet Take 600 mg by mouth daily with breakfast.  . cephALEXin (KEFLEX) 500 MG capsule Take 1 capsule by mouth 3 (three) times daily. X 1 week - then 1 nightly (dr Jeffie Pollock)  . cholecalciferol (VITAMIN D) 1000 UNITS tablet Take 1,000 Units by mouth daily.  . fluticasone (FLONASE) 50 MCG/ACT nasal spray USE 2 SPRAYS IN EACH NOSTRIL DAILY  . ibuprofen (ADVIL,MOTRIN) 200 MG tablet Take 200 mg by mouth every 6 (six) hours as needed.  . Multiple Vitamins-Minerals (CENTRUM SILVER) tablet Take 1 tablet by mouth every evening.  . ranitidine (ZANTAC) 150 MG tablet Take 150 mg by mouth 2 (two) times daily.  . simvastatin (ZOCOR) 40 MG tablet Take 1 tablet (40 mg total) by mouth daily.  . nitroGLYCERIN (NITROSTAT) 0.4 MG SL tablet Place 1 tablet (0.4 mg total) under the tongue every 5 (five) minutes as needed for chest pain.   Facility-Administered Encounter Medications as of 10/08/2017  Medication  . cyanocobalamin ((VITAMIN B-12)) injection 1,000 mcg     Review of Systems  HENT: Positive for sinus pressure.        Objective:   Physical Exam  Constitutional: She is oriented to person, place, and time. She appears well-developed and well-nourished. No distress.  The patient is elderly but alert and somewhat more fragile than  usual.  HENT:  Head: Normocephalic and atraumatic.  Right Ear: External ear normal.  Left Ear: External ear normal.  Mouth/Throat: Oropharynx is clear and moist. No oropharyngeal exudate.  Some nasal congestion bilaterally  Eyes: Pupils are equal, round, and reactive to light. Conjunctivae and EOM are normal. Right eye exhibits no discharge. Left eye exhibits no discharge. No scleral icterus.  Neck: Normal range of motion. Neck supple. No thyromegaly present.  No thyromegaly or bruits.  Cardiovascular: Normal rate, regular rhythm, normal heart sounds and intact distal  pulses.  No murmur heard. The heart is regular at about 72/min  Pulmonary/Chest: Effort normal. No respiratory distress. She has no wheezes. She has no rales.  Diminished breath sounds bilaterally with a few crackles in the base but from what I remember no worse than previously.  Abdominal: Soft. Bowel sounds are normal. She exhibits no mass. There is tenderness.  Mild generalized abdominal tenderness without masses organ enlargement.  Tenderness is minimal.  Musculoskeletal: She exhibits deformity. She exhibits no edema or tenderness.  Range of motion is hesitant because of her kyphosis.  Lymphadenopathy:    She has no cervical adenopathy.  Neurological: She is alert and oriented to person, place, and time. She has normal reflexes.  Skin: Skin is warm and dry. No rash noted.  Psychiatric: She has a normal mood and affect. Her behavior is normal. Judgment and thought content normal.  Mood affect and behavior is positive and normal for her.  Nursing note and vitals reviewed.  BP (!) 151/63   Pulse 66   Temp 98.1 F (36.7 C) (Oral)   Ht 4\' 9"  (1.448 m)   Wt 96 lb 8 oz (43.8 kg)   BMI 20.88 kg/m        Assessment & Plan:  1. Pure hypercholesterolemia -Continue with healthy eating lifestyles - Basic Metabolic Panel - Lipid panel - Hepatic function panel  2. B12 deficiency -Continue with B12 replacement - CBC with Differential/Platelet  3. Spinal stenosis of lumbar region without neurogenic claudication -Follow-up with Dr. Reuel Boom as needed for shots and back pain  4. Idiopathic pulmonary fibrosis ([facility]) -Follow-up with Dr. Chase Caller as planned for pulmonary fibrosis  5. Interstitial lung disease ([place]) -Follow-up with pulmonologist  6. Weight loss -Start with Megace 40 mg daily and have daughter call us in a couple weeks regarding her appetite  7. Age-related osteoporosis without current pathological fracture -Continue with alendronate but consider switching to  Prolia  No orders of the defined types were placed in this encounter.  Patient Instructions  Continue current medications. Continue good therapeutic lifestyle changes which include good diet and exercise. Fall precautions discussed with patient. If an FOBT was given today- please return it to our front desk. If you are over 70 years old - you may need Prevnar 6 or the adult Pneumonia vaccine.  **Flu shots are available--- please call and schedule a FLU-CLINIC appointment**  After your visit with [place] today you will receive a survey in the mail or online from [employer] regarding your care with [place]. Please take a moment to fill this out. Your feedback is very important to [place] as you can help [place] better understand your patient needs as well as improve your experience and satisfaction. WE CARE ABOUT YOU!!!    Arrie Senate MD  After the patient had left I did call her daughter [name] and discussed the Megace and the possibility of switching her to Prolia from alendronate.  The daughter will call  us back in 2 to 3 weeks regarding any impact on her appetite from taking the Megace and we will consider going up to 40 twice a day at that time.  We will postpone changing to Prolia for the time being.  Arrie Senate MD

## 2017-10-08 NOTE — Patient Instructions (Addendum)
Continue current medications. Continue good therapeutic lifestyle changes which include good diet and exercise. Fall precautions discussed with patient. If an FOBT was given today- please return it to our front desk. If you are over 82 years old - you may need Prevnar 42 or the adult Pneumonia vaccine.  **Flu shots are available--- please call and schedule a FLU-CLINIC appointment**  After your visit with [place] today you will receive a survey in the mail or online from [employer] regarding your care with [place]. Please take a moment to fill this out. Your feedback is very important to [place] as you can help [place] better understand your patient needs as well as improve your experience and satisfaction. WE CARE ABOUT YOU!!!   Follow up with pulmonologist as planned Return to clinic for flu shot next month Continue to be careful do not put yourself at risk for falling Eat more Stay with Ensure or extra nourishment as currently doing

## 2017-10-09 LAB — CBC WITH DIFFERENTIAL/PLATELET
BASOS: 1 %
Basophils Absolute: 0.1 10*3/uL (ref 0.0–0.2)
EOS (ABSOLUTE): 0.1 10*3/uL (ref 0.0–0.4)
EOS: 3 %
Hematocrit: 39.8 % (ref 34.0–46.6)
Hemoglobin: 12.8 g/dL (ref 11.1–15.9)
Immature Grans (Abs): 0 10*3/uL (ref 0.0–0.1)
Immature Granulocytes: 0 %
LYMPHS ABS: 1 10*3/uL (ref 0.7–3.1)
Lymphs: 20 %
MCH: 27.5 pg (ref 26.6–33.0)
MCHC: 32.2 g/dL (ref 31.5–35.7)
MCV: 85 fL (ref 79–97)
MONOS ABS: 0.6 10*3/uL (ref 0.1–0.9)
Monocytes: 11 %
Neutrophils Absolute: 3.3 10*3/uL (ref 1.4–7.0)
Neutrophils: 65 %
PLATELETS: 283 10*3/uL (ref 150–450)
RBC: 4.66 x10E6/uL (ref 3.77–5.28)
RDW: 12.2 % — AB (ref 12.3–15.4)
WBC: 5 10*3/uL (ref 3.4–10.8)

## 2017-10-09 LAB — BASIC METABOLIC PANEL
BUN / CREAT RATIO: 16 (ref 12–28)
BUN: 10 mg/dL (ref 10–36)
CHLORIDE: 99 mmol/L (ref 96–106)
CO2: 28 mmol/L (ref 20–29)
Calcium: 9.3 mg/dL (ref 8.7–10.3)
Creatinine, Ser: 0.61 mg/dL (ref 0.57–1.00)
GFR calc non Af Amer: 80 mL/min/{1.73_m2} (ref >59)
GFR, EST AFRICAN AMERICAN: 92 mL/min/{1.73_m2} (ref >59)
Glucose: 101 mg/dL — ABNORMAL HIGH (ref 65–99)
Potassium: 3.8 mmol/L (ref 3.5–5.2)
Sodium: 143 mmol/L (ref 134–144)

## 2017-10-09 LAB — HEPATIC FUNCTION PANEL
ALBUMIN: 3.6 g/dL (ref 3.2–4.6)
ALK PHOS: 69 IU/L (ref 39–117)
ALT: 8 IU/L (ref 0–32)
AST: 24 IU/L (ref 0–40)
Bilirubin Total: 0.3 mg/dL (ref 0.0–1.2)
Bilirubin, Direct: 0.14 mg/dL (ref 0.00–0.40)
TOTAL PROTEIN: 6.5 g/dL (ref 6.0–8.5)

## 2017-10-09 LAB — LIPID PANEL
Chol/HDL Ratio: 2.5 ratio (ref 0.0–4.4)
Cholesterol, Total: 155 mg/dL (ref 100–199)
HDL: 63 mg/dL (ref >39)
LDL Calculated: 74 mg/dL (ref 0–99)
Triglycerides: 91 mg/dL (ref 0–149)
VLDL Cholesterol Cal: 18 mg/dL (ref 5–40)

## 2017-10-10 NOTE — Progress Notes (Signed)
Cardiology Office Note  Date: 10/13/2017   ID: [name], DOB 1925-10-27, MRN [id number]  PCP: Cathy Herb, MD  Primary Cardiologist: Cathy Lesches, MD   Chief Complaint  Patient presents with  . Cardiac follow-up    History of Present Illness: [name] is a 84 y.o. female seen in consultation back in March of this year.  She is here today with a family member.  She does not report any chest pain or nitroglycerin use since last encounter.  She continues to follow regularly with Dr. Laurance Flatten.  She remains functional with ADLs around the house.  I reviewed her medications.  She continues on aspirin, Zocor, and has as needed nitroglycerin available.  Recent lipid panel showed LDL 74.  Past Medical History:  Diagnosis Date  . Arthritis   . Cataract   . Chronic back pain   . Glaucoma   . Hyperlipidemia   . Idiopathic pulmonary fibrosis ([place])   . Lumbar stenosis   . Osteoarthrosis and allied disorders   . Osteoporosis   . Peripheral neuropathy   . Vitamin D deficiency     Past Surgical History:  Procedure Laterality Date  . ABDOMINAL HYSTERECTOMY    . CATARACT EXTRACTION    . Cataract surgery Bilateral   . Eyelid surgery    . KNEE ARTHROSCOPY    . LUMBAR LAMINECTOMY      Current Outpatient Medications  Medication Sig Dispense Refill  . albuterol (VENTOLIN HFA) 108 (90 Base) MCG/ACT inhaler 2 puffs every 4-6 hours as needed for wheezing, shortness of breath    . alendronate (FOSAMAX) 70 MG tablet TAKE 1 TABLET WEEKLY (TAKE WITH 8OZ OF WATER 30 MINUTES BEFORE BREAKFAST) 12 tablet 2  . aspirin EC 81 MG tablet Take 1 tablet (81 mg total) by mouth daily. 90 tablet 3  . BREO ELLIPTA 100-25 MCG/INH AEPB USE 1 INHALATION DAILY 60 each 5  . calcium carbonate (OS-CAL) 600 MG TABS tablet Take 600 mg by mouth daily with breakfast.    . cephALEXin (KEFLEX) 500 MG capsule Take 1 capsule by mouth 3 (three) times daily. X 1 week - then 1 nightly (dr Jeffie Pollock)    .  cholecalciferol (VITAMIN D) 1000 UNITS tablet Take 1,000 Units by mouth daily.    . fluticasone (FLONASE) 50 MCG/ACT nasal spray USE 2 SPRAYS IN EACH NOSTRIL DAILY 16 g 11  . ibuprofen (ADVIL,MOTRIN) 200 MG tablet Take 200 mg by mouth every 6 (six) hours as needed.    . megestrol (MEGACE) 40 MG tablet Take 20 mg by mouth 2 (two) times daily.    . Multiple Vitamins-Minerals (CENTRUM SILVER) tablet Take 1 tablet by mouth every evening.    . nitroGLYCERIN (NITROSTAT) 0.4 MG SL tablet Place 1 tablet (0.4 mg total) under the tongue every 5 (five) minutes as needed for chest pain. 25 tablet 3  . ranitidine (ZANTAC) 150 MG tablet Take 150 mg by mouth 2 (two) times daily.    . simvastatin (ZOCOR) 40 MG tablet Take 1 tablet (40 mg total) by mouth daily. 90 tablet 0   Current Facility-Administered Medications  Medication Dose Route Frequency Provider Last Rate Last Dose  . cyanocobalamin ((VITAMIN B-12)) injection 1,000 mcg  1,000 mcg Intramuscular Q30 days Cathy Herb, MD   1,000 mcg at 10/03/17 1221   Allergies:  Mucinex [guaifenesin er]; Levaquin [levaquin leva-pak]; Erythromycin; and Penicillins   Social History: The patient  reports that she has never smoked. She has  never used smokeless tobacco. She reports that she does not drink alcohol or use drugs.   ROS:  Please see the history of present illness. Otherwise, complete review of systems is positive for hearing loss, venous stasis.  All other systems are reviewed and negative.   Physical Exam: VS:  BP 118/68   Pulse 69   Ht 4\' 9"  (1.448 m)   Wt 96 lb 3.2 oz (43.6 kg)   SpO2 96%   BMI 20.82 kg/m , BMI Body mass index is 20.82 kg/m.  Wt Readings from Last 3 Encounters:  10/13/17 96 lb 3.2 oz (43.6 kg)  10/08/17 96 lb 8 oz (43.8 kg)  10/03/17 95 lb (43.1 kg)    General: Frail-appearing elderly woman, appears comfortable at rest. HEENT: Conjunctiva and lids normal, oropharynx clear. Neck: Supple, no elevated JVP or carotid  bruits. Lungs: No wheezing, nonlabored breathing at rest. Cardiac: Regular rate and rhythm, no S3, 2/6 systolic murmur. Abdomen: Soft, nontender, bowel sounds present. Extremities: Venous stasis distally, distal pulses 2+. Skin: Warm and dry. Musculoskeletal: Mild kyphosis. Neuropsychiatric: Alert and oriented x3, affect grossly appropriate.  ECG: I personally reviewed the tracing from 03/12/2017 which showed sinus rhythm with IVCD.  Recent Labwork: 01/24/2017: TSH 0.615 10/08/2017: ALT 8; AST 24; BUN 10; Creatinine, Ser 0.61; Hemoglobin 12.8; Platelets 283; Potassium 3.8; Sodium 143     Component Value Date/Time   CHOL 155 10/08/2017 1638   CHOL 158 07/16/2012 1151   TRIG 91 10/08/2017 1638   TRIG 105 06/22/2014 1121   TRIG 134 07/16/2012 1151   HDL 63 10/08/2017 1638   HDL 66 06/22/2014 1121   HDL 57 07/16/2012 1151   CHOLHDL 2.5 10/08/2017 1638   LDLCALC 74 10/08/2017 1638   LDLCALC 76 07/02/2013 1115   LDLCALC 74 07/16/2012 1151    Other Studies Reviewed Today:  Chest CT 12/16/2016: IMPRESSION: 1. Fibrotic interstitial lung disease has progressed from 07/11/2010 and may be due to fibrotic nonspecific interstitial pneumonitis. Distribution is considered somewhat non typical for usual interstitial pneumonitis given areas of more central interstitial involvement as well as lack of a strong craniocaudal gradient, but is not excluded. 2. Aortic atherosclerosis (ICD10-170.0). Severe coronary artery calcification. 3. Enlarged pulmonary arteries, indicative of pulmonary arterial hypertension.  Assessment and Plan:  1.  History of chest pain, no significant recurrences since earlier in the year.  She has coronary artery atherosclerosis by chest CT imaging and is being managed medically for possible underlying ischemic heart disease.  Anticipate conservative management overall.  Continue aspirin, Zocor, and as needed nitroglycerin.  2.  Idiopathic pulmonary fibrosis, followed  in the pulmonary division.  3.  Mixed hyperlipidemia, continues on Zocor with recent LDL 74.  Current medicines were reviewed with the patient today.  Disposition: Follow-up in 6 months.  Signed, Satira Sark, MD, [facility] 10/13/2017 2:49 PM    [facility] [place], [place], [place] [address] Phone: [phone]; Fax: [phone]

## 2017-10-13 ENCOUNTER — Ambulatory Visit (INDEPENDENT_AMBULATORY_CARE_PROVIDER_SITE_OTHER): Payer: Medicare Other | Admitting: Cardiology

## 2017-10-13 ENCOUNTER — Encounter: Payer: Self-pay | Admitting: Cardiology

## 2017-10-13 VITALS — BP 118/68 | HR 69 | Ht <= 58 in | Wt 96.2 lb

## 2017-10-13 DIAGNOSIS — I2510 Atherosclerotic heart disease of native coronary artery without angina pectoris: Secondary | ICD-10-CM | POA: Diagnosis not present

## 2017-10-13 DIAGNOSIS — E782 Mixed hyperlipidemia: Secondary | ICD-10-CM

## 2017-10-13 DIAGNOSIS — J84112 Idiopathic pulmonary fibrosis: Secondary | ICD-10-CM | POA: Diagnosis not present

## 2017-10-13 NOTE — Patient Instructions (Signed)

## 2017-10-30 DIAGNOSIS — D044 Carcinoma in situ of skin of scalp and neck: Secondary | ICD-10-CM | POA: Diagnosis not present

## 2017-10-30 DIAGNOSIS — L570 Actinic keratosis: Secondary | ICD-10-CM | POA: Diagnosis not present

## 2017-10-30 DIAGNOSIS — C4442 Squamous cell carcinoma of skin of scalp and neck: Secondary | ICD-10-CM | POA: Diagnosis not present

## 2017-10-31 ENCOUNTER — Other Ambulatory Visit: Payer: Self-pay | Admitting: Family Medicine

## 2017-11-11 ENCOUNTER — Telehealth: Payer: Self-pay | Admitting: Family Medicine

## 2017-11-11 ENCOUNTER — Ambulatory Visit (INDEPENDENT_AMBULATORY_CARE_PROVIDER_SITE_OTHER): Payer: Medicare Other | Admitting: *Deleted

## 2017-11-11 DIAGNOSIS — E538 Deficiency of other specified B group vitamins: Secondary | ICD-10-CM

## 2017-11-11 DIAGNOSIS — Z23 Encounter for immunization: Secondary | ICD-10-CM

## 2017-11-11 DIAGNOSIS — Z1231 Encounter for screening mammogram for malignant neoplasm of breast: Secondary | ICD-10-CM | POA: Diagnosis not present

## 2017-11-11 NOTE — Progress Notes (Signed)
Pt given Cyanocobalamin inj and flu vaccine Tolerated well

## 2017-12-05 ENCOUNTER — Encounter (HOSPITAL_COMMUNITY): Payer: Self-pay | Admitting: *Deleted

## 2017-12-05 ENCOUNTER — Emergency Department (HOSPITAL_COMMUNITY): Payer: Medicare Other

## 2017-12-05 ENCOUNTER — Emergency Department (HOSPITAL_COMMUNITY)
Admission: EM | Admit: 2017-12-05 | Discharge: 2017-12-06 | Disposition: A | Discharge status: Home / Self Care | Payer: Medicare Other | Attending: Emergency Medicine | Admitting: Emergency Medicine

## 2017-12-05 ENCOUNTER — Other Ambulatory Visit: Payer: Self-pay

## 2017-12-05 DIAGNOSIS — Z7982 Long term (current) use of aspirin: Secondary | ICD-10-CM | POA: Insufficient documentation

## 2017-12-05 DIAGNOSIS — J8410 Pulmonary fibrosis, unspecified: Secondary | ICD-10-CM | POA: Insufficient documentation

## 2017-12-05 DIAGNOSIS — Z79899 Other long term (current) drug therapy: Secondary | ICD-10-CM | POA: Insufficient documentation

## 2017-12-05 DIAGNOSIS — R0602 Shortness of breath: Secondary | ICD-10-CM | POA: Diagnosis not present

## 2017-12-05 DIAGNOSIS — R110 Nausea: Secondary | ICD-10-CM | POA: Diagnosis not present

## 2017-12-05 DIAGNOSIS — R0609 Other forms of dyspnea: Secondary | ICD-10-CM | POA: Insufficient documentation

## 2017-12-05 DIAGNOSIS — I2510 Atherosclerotic heart disease of native coronary artery without angina pectoris: Secondary | ICD-10-CM | POA: Diagnosis not present

## 2017-12-05 DIAGNOSIS — R112 Nausea with vomiting, unspecified: Secondary | ICD-10-CM | POA: Diagnosis not present

## 2017-12-05 DIAGNOSIS — R0600 Dyspnea, unspecified: Secondary | ICD-10-CM | POA: Diagnosis not present

## 2017-12-05 DIAGNOSIS — J441 Chronic obstructive pulmonary disease with (acute) exacerbation: Secondary | ICD-10-CM | POA: Insufficient documentation

## 2017-12-05 DIAGNOSIS — I2584 Coronary atherosclerosis due to calcified coronary lesion: Secondary | ICD-10-CM | POA: Diagnosis not present

## 2017-12-05 MED ORDER — ALBUTEROL SULFATE (2.5 MG/3ML) 0.083% IN NEBU
5.0000 mg | INHALATION_SOLUTION | Freq: Once | RESPIRATORY_TRACT | Status: AC
  Administered 2017-12-05: 5 mg via RESPIRATORY_TRACT
  Filled 2017-12-05: qty 6

## 2017-12-05 NOTE — ED Triage Notes (Signed)
Shortness of breath, seen at [facility] today for same

## 2017-12-05 NOTE — ED Triage Notes (Signed)
Family member state a good friend passed away this week and wonder if this is the problem

## 2017-12-05 NOTE — ED Provider Notes (Signed)
[facility] EMERGENCY DEPARTMENT Provider Note   CSN: [id number] Arrival date & time: 12/05/17  2214  Time Seen 23:05 PM   History   Chief Complaint Chief Complaint  Patient presents with  . Shortness of Breath    HPI [name] is a 84 y.o. female.  HPI patient has a history of pulmonary fibrosis and is followed by Dr. Chase Caller.  She was last seen in July and has an appointment on January 9.  She has had shortness of breath for a few days.  She she and her family note that talking, walking, and physically getting up and down makes her get very short of breath.  She has a Ventolin rescue inhaler that her family states she is unable to use well due to weakness in her hands.  They have also used it with a spacer without success.  She denies any coughing.  She states she does not feel short of breath all the time.  About 1 AM this morning she was feeling anxious and felt like she could not be still and had some nausea.  She went to [facility] this morning and they gave her a nebulizer which helped.  They also did some x-rays.  They were told that her underlying fibrosis was getting progressively worse.  They report however when she got home she felt like the nebulizer had worn off and she started really short of breath again.  Her granddaughter helped her do her Ventolin inhaler at 715 but she felt like it did not help.  She was still having a respiratory rate 34.  She also complained of feeling that something was running through her arms and legs.  They also complains she has lost weight from 140 pounds down to 96 pounds in the past 1-1/2 years.  She was started on prednisone today at the ED and she has been placed on Megace for her appetite.  Family notes that a very close friend died on 2022-12-08 and they feel like she has been more anxious since then.  Patient lives home alone.  Patient expresses a lot of regret that she has difficulty doing things that she likes to do such as  cleaning, washing close, canning but her family states she still is able to do it itches makes her more short of breath.  She also states she realizes that at some point she will have to either has someone live with her or she will have to live with someone else which she does not want to do.  PCP Chipper Herb, MD Pulmonary Dr Onnie Graham  Past Medical History:  Diagnosis Date  . Arthritis   . Cataract   . Chronic back pain   . Glaucoma   . Hyperlipidemia   . Idiopathic pulmonary fibrosis ([place])   . Lumbar stenosis   . Osteoarthrosis and allied disorders   . Osteoporosis   . Peripheral neuropathy   . Vitamin D deficiency     Patient Active Problem List   Diagnosis Date Noted  . Atypical chest pain 03/24/2017  . Weight loss 03/24/2017  . IPF (idiopathic pulmonary fibrosis) ([place]) 08/09/2016  . Interstitial lung disease ([place]) 07/07/2015  . Nodule of right lung 07/07/2015  . Chronic cystitis 01/08/2013  . Vitamin D deficiency 12/31/2012  . Osteoporosis 12/31/2012  . Spinal stenosis of lumbar region 12/31/2012  . Hyperlipidemia 07/16/2012  . Chronic low back pain 07/16/2012  . Vitamin B12 deficient megaloblastic anemia 07/16/2012    Past  Surgical History:  Procedure Laterality Date  . ABDOMINAL HYSTERECTOMY    . CATARACT EXTRACTION    . Cataract surgery Bilateral   . Eyelid surgery    . KNEE ARTHROSCOPY    . LUMBAR LAMINECTOMY       OB History   None      Home Medications    Prior to Admission medications   Medication Sig Start Date End Date Taking? Authorizing Provider  albuterol (VENTOLIN HFA) 108 (90 Base) MCG/ACT inhaler 2 puffs every 4-6 hours as needed for wheezing, shortness of breath    [provider]  alendronate (FOSAMAX) 70 MG tablet TAKE 1 TABLET WEEKLY (TAKE WITH 8OZ OF WATER 30 MINUTES BEFORE BREAKFAST) 11/03/17   Chipper Herb, MD  aspirin EC 81 MG tablet Take 1 tablet (81 mg total) by mouth daily. 04/10/17   Satira Sark, MD    BREO ELLIPTA 100-25 MCG/INH AEPB USE 1 INHALATION DAILY 04/18/17   Chipper Herb, MD  calcium carbonate (OS-CAL) 600 MG TABS tablet Take 600 mg by mouth daily with breakfast.    [provider]  cephALEXin (KEFLEX) 500 MG capsule Take 1 capsule by mouth 3 (three) times daily. X 1 week - then 1 nightly (dr Jeffie Pollock) 04/09/16   [provider]  cholecalciferol (VITAMIN D) 1000 UNITS tablet Take 1,000 Units by mouth daily.    [provider]  fluticasone Asencion Islam) 50 MCG/ACT nasal spray USE 2 SPRAYS IN EACH NOSTRIL DAILY 06/23/17   Chipper Herb, MD  ibuprofen (ADVIL,MOTRIN) 200 MG tablet Take 200 mg by mouth every 6 (six) hours as needed.    [provider]  megestrol (MEGACE) 40 MG tablet Take 20 mg by mouth 2 (two) times daily.    [provider]  Multiple Vitamins-Minerals (CENTRUM SILVER) tablet Take 1 tablet by mouth every evening.    [provider]  nitroGLYCERIN (NITROSTAT) 0.4 MG SL tablet Place 1 tablet (0.4 mg total) under the tongue every 5 (five) minutes as needed for chest pain. 04/10/17 10/14/18  Satira Sark, MD  ranitidine (ZANTAC) 150 MG tablet Take 150 mg by mouth 2 (two) times daily.    [provider]  simvastatin (ZOCOR) 40 MG tablet TAKE ONE (1) TABLET EACH DAY 11/03/17   Chipper Herb, MD    Family History Family History  Problem Relation Age of Onset  . Bone cancer Mother   . Breast cancer Sister   . Diabetes Brother   . Asthma Daughter   . COPD Son   . Asthma Son     Social History Social History   Tobacco Use  . Smoking status: Never Smoker  . Smokeless tobacco: Never Used  Substance Use Topics  . Alcohol use: No    Alcohol/week: 0.0 standard drinks  . Drug use: No  lives at home Lives alone   Allergies   Mucinex [guaifenesin er]; Levaquin [levaquin leva-pak]; Erythromycin; and Penicillins   Review of Systems Review of Systems  All other systems reviewed and are  negative.    Physical Exam Updated Vital Signs BP (!) 162/88   Pulse 77   Temp 98.3 F (36.8 C) (Oral)   Resp (!) 30   Ht 4\' 9"  (1.448 m)   Wt 43.5 kg   SpO2 98%   BMI 20.77 kg/m   Vital signs normal    Physical Exam  Constitutional: She is oriented to person, place, and time.  Non-toxic appearance. She does not appear ill.  No distress.  Frail elderly female  HENT:  Head: Normocephalic and atraumatic.  Right Ear: External ear normal.  Left Ear: External ear normal.  Nose: Nose normal. No mucosal edema or rhinorrhea.  Mouth/Throat: Oropharynx is clear and moist and mucous membranes are normal. No dental abscesses or uvula swelling.  Eyes: Pupils are equal, round, and reactive to light. Conjunctivae and EOM are normal.  Neck: Normal range of motion and full passive range of motion without pain. Neck supple.  Cardiovascular: Normal rate, regular rhythm and normal heart sounds. Exam reveals no gallop and no friction rub.  No murmur heard. Pulmonary/Chest: Tachypnea noted. She is in respiratory distress. She has decreased breath sounds. She has no wheezes. She has no rhonchi. She has no rales. She exhibits no tenderness and no crepitus.  Patient was examined after a nebulizer treatment  Abdominal: Soft. Normal appearance and bowel sounds are normal. She exhibits no distension. There is no tenderness. There is no rebound and no guarding.  Musculoskeletal: Normal range of motion. She exhibits no edema or tenderness.  Moves all extremities well.   Neurological: She is alert and oriented to person, place, and time. She has normal strength. No cranial nerve deficit.  Skin: Skin is warm, dry and intact. No rash noted. No erythema. No pallor.  Psychiatric: She has a normal mood and affect. Her speech is normal and behavior is normal. Her mood appears not anxious.  Nursing note and vitals reviewed.    ED Treatments / Results  Labs (all labs ordered are listed, but only abnormal  results are displayed) Labs Reviewed  COMPREHENSIVE METABOLIC PANEL - Abnormal; Notable for the following components:      Result Value   Glucose, Bld 148 (*)    All other components within normal limits  CBC WITH DIFFERENTIAL/PLATELET - Abnormal; Notable for the following components:   Hemoglobin 11.4 (*)    Lymphs Abs 0.3 (*)    All other components within normal limits  TROPONIN I    EKG EKG Interpretation  Date/Time:  Friday December 05 2017 22:23:30 EST Ventricular Rate:  84 PR Interval:    QRS Duration: 145 QT Interval:  426 QTC Calculation: 504 R Axis:   -10 Text Interpretation:  Sinus rhythm Left bundle branch block Baseline wander in lead(s) II III aVF Electrode noise Since last tracing 19 May 2008 Left anterior hemiblock has progressed to LBBB Confirmed by Rolland Porter [phone]) on 12/05/2017 10:59:45 PM   EKG Interpretation  Date/Time:  Friday December 05 2017 22:26:18 EST Ventricular Rate:  80 PR Interval:    QRS Duration: 149 QT Interval:  445 QTC Calculation: 514 R Axis:   -12 Text Interpretation:  Sinus rhythm Left bundle branch block No significant change since 3 minutes before Confirmed by Rolland Porter [phone]) on 12/05/2017 11:00:50 PM        Radiology No results found.   Chest x-ray done November 22 at [facility] And impression stable interstitial fibrosis bilaterally, most severe in the right mid and lower lung zones.  There is no frank edema or consolidation.  Stable cardiac silhouette.  No adenopathy.  There is aortic atherosclerosis.  Chest CTA done November 22 at [place] #1 no evidence of pulmonary embolism or other acute findings in the chest, #2 stable severe chronic emphysematous lung disease with probable component of pulmonary fibrosis.  Previously noted small left apical pneumothorax has resolved, #3 stable calcified plaque in the distribution of LAD  Procedures Procedures (including critical care  time)  Medications  Ordered in ED Medications  albuterol (PROVENTIL) (2.5 MG/3ML) 0.083% nebulizer solution 5 mg (5 mg Nebulization Given 12/05/17 2301)  albuterol (PROVENTIL) (2.5 MG/3ML) 0.083% nebulizer solution 5 mg (5 mg Nebulization Given 12/06/17 0053)     Initial Impression / Assessment and Plan / ED Course  I have reviewed the triage vital signs and the nursing notes.  Pertinent labs & imaging results that were available during my care of the patient were reviewed by me and considered in my medical decision making (see chart for details).    Patient was ambulated by nursing staff and her oxygen range was 95 to 99% on room air.  12:20 AM patient's laboratory tests have resulted.  I discussed the results with the family which they are good with a mildly elevated leukosis which is a nonfasting glucose and also she was started on steroids earlier today.  She was given a second nebulizer before she goes home to hopefully help her sleep through the night.  They state her doctor's office is open on Saturday morning, they were encouraged to call the office and have her seen since she is been to the ER twice in 1 day.  They should discuss if she needs to be on anxiety medication and about getting her a home nebulizer machine to use which seems to work better for her than the inhaler.   Final Clinical Impressions(s) / ED Diagnoses   Final diagnoses:  DOE (dyspnea on exertion)  Pulmonary fibrosis ([place])  COPD exacerbation [facility])    ED Discharge Orders    None     Plan discharge  Rolland Porter, MD, Barbette Or, MD 12/06/17 [phone]

## 2017-12-06 DIAGNOSIS — J441 Chronic obstructive pulmonary disease with (acute) exacerbation: Secondary | ICD-10-CM | POA: Diagnosis not present

## 2017-12-06 LAB — CBC WITH DIFFERENTIAL/PLATELET
ABS IMMATURE GRANULOCYTES: 0.01 10*3/uL (ref 0.00–0.07)
BASOS ABS: 0 10*3/uL (ref 0.0–0.1)
Basophils Relative: 0 %
EOS PCT: 0 %
Eosinophils Absolute: 0 10*3/uL (ref 0.0–0.5)
HEMATOCRIT: 36.2 % (ref 36.0–46.0)
HEMOGLOBIN: 11.4 g/dL — AB (ref 12.0–15.0)
IMMATURE GRANULOCYTES: 0 %
LYMPHS ABS: 0.3 10*3/uL — AB (ref 0.7–4.0)
LYMPHS PCT: 6 %
MCH: 27.2 pg (ref 26.0–34.0)
MCHC: 31.5 g/dL (ref 30.0–36.0)
MCV: 86.4 fL (ref 80.0–100.0)
Monocytes Absolute: 0.3 10*3/uL (ref 0.1–1.0)
Monocytes Relative: 6 %
NEUTROS ABS: 4.1 10*3/uL (ref 1.7–7.7)
NRBC: 0 % (ref 0.0–0.2)
Neutrophils Relative %: 88 %
Platelets: 281 10*3/uL (ref 150–400)
RBC: 4.19 MIL/uL (ref 3.87–5.11)
RDW: 13.2 % (ref 11.5–15.5)
WBC: 4.7 10*3/uL (ref 4.0–10.5)

## 2017-12-06 LAB — COMPREHENSIVE METABOLIC PANEL
ALT: 12 U/L (ref 0–44)
AST: 26 U/L (ref 15–41)
Albumin: 3.6 g/dL (ref 3.5–5.0)
Alkaline Phosphatase: 57 U/L (ref 38–126)
Anion gap: 7 (ref 5–15)
BILIRUBIN TOTAL: 0.4 mg/dL (ref 0.3–1.2)
BUN: 10 mg/dL (ref 8–23)
CO2: 25 mmol/L (ref 22–32)
Calcium: 8.9 mg/dL (ref 8.9–10.3)
Chloride: 107 mmol/L (ref 98–111)
Creatinine, Ser: 0.58 mg/dL (ref 0.44–1.00)
GFR calc Af Amer: >60 mL/min (ref >60)
GLUCOSE: 148 mg/dL — AB (ref 70–99)
Potassium: 3.5 mmol/L (ref 3.5–5.1)
Sodium: 139 mmol/L (ref 135–145)
Total Protein: 6.9 g/dL (ref 6.5–8.1)

## 2017-12-06 LAB — TROPONIN I: Troponin I: <0.03 ng/mL (ref <0.03)

## 2017-12-06 MED ORDER — ALBUTEROL SULFATE (2.5 MG/3ML) 0.083% IN NEBU
5.0000 mg | INHALATION_SOLUTION | Freq: Once | RESPIRATORY_TRACT | Status: AC
Start: 2017-12-06 — End: 2017-12-06
  Administered 2017-12-06: 5 mg via RESPIRATORY_TRACT
  Filled 2017-12-06: qty 6

## 2017-12-06 NOTE — ED Notes (Signed)
Ambulated 1 lap around nursing station. Oxygen range between 95-99% on RA

## 2017-12-06 NOTE — Discharge Instructions (Signed)
Continue using the Ventolin inhaler.  Finish the prednisone you were prescribed earlier today.  Please call your doctor's office today to discuss getting a home nebulizer machine for you to use.  Also please talk to your doctor about your anxiety to see if they feel like you would benefit from medication.

## 2017-12-08 ENCOUNTER — Encounter: Payer: Self-pay | Admitting: Family

## 2017-12-08 ENCOUNTER — Ambulatory Visit (INDEPENDENT_AMBULATORY_CARE_PROVIDER_SITE_OTHER): Payer: Medicare Other | Admitting: Family

## 2017-12-08 ENCOUNTER — Telehealth: Payer: Self-pay | Admitting: Family Medicine

## 2017-12-08 VITALS — BP 129/69 | HR 85 | Temp 96.8°F | Ht <= 58 in | Wt 96.8 lb

## 2017-12-08 DIAGNOSIS — J849 Interstitial pulmonary disease, unspecified: Secondary | ICD-10-CM | POA: Diagnosis not present

## 2017-12-08 DIAGNOSIS — F410 Panic disorder [episodic paroxysmal anxiety] without agoraphobia: Secondary | ICD-10-CM | POA: Diagnosis not present

## 2017-12-08 DIAGNOSIS — I2510 Atherosclerotic heart disease of native coronary artery without angina pectoris: Secondary | ICD-10-CM | POA: Diagnosis not present

## 2017-12-08 DIAGNOSIS — F419 Anxiety disorder, unspecified: Secondary | ICD-10-CM

## 2017-12-08 DIAGNOSIS — Z09 Encounter for follow-up examination after completed treatment for conditions other than malignant neoplasm: Secondary | ICD-10-CM | POA: Diagnosis not present

## 2017-12-08 MED ORDER — BUSPIRONE HCL 5 MG PO TABS: 5.0000 mg | ORAL_TABLET | Freq: Three times a day (TID) | ORAL | 0 refills | Status: DC

## 2017-12-08 MED ORDER — ALBUTEROL SULFATE (2.5 MG/3ML) 0.083% IN NEBU: 2.5000 mg | INHALATION_SOLUTION | Freq: Four times a day (QID) | RESPIRATORY_TRACT | 1 refills | Status: DC | PRN

## 2017-12-08 NOTE — Telephone Encounter (Signed)
Patient was taken to ER at [facility] on 12/05/2017 for shortness of breath.  There are no appointments available this week with Dr. Laurance Flatten.  Appointment scheduled for ER followup today at 11:40 am with [facility].

## 2017-12-08 NOTE — Progress Notes (Signed)
Subjective:    Patient ID: [name], female    DOB: 09-Jun-1925, 84 y.o.   MRN: [id number]  Chief Complaint  Patient presents with  . ER follow up    HPI PT presents to the office today for hospital follow up. She went [facility] on 12/05/17 for difficulty breathing and anxiousness. They gave her a "breathing treatment" and she felt some better. She had a  chest x-ray that showed stable interstitial fibrosis. She had a chest CT that was negative for PE. She was started on prednisone and she was discharged home.   Then several hours later she became anxious and having "trouble catching her breathe". She then went to [facility] ED. She was given nebulizer treatment.  Pt and daughter states one of her close friends passed away on Dec 03, 2017 and this seemed to spark this anxiety. Since last week patient has had several panic attacks and even woke up multiple times a night with anxiety. Her grand daughter has stayed the night with her over the weekend that may have eased some of the anxiety.     Review of Systems  Respiratory: Positive for shortness of breath.   Psychiatric/Behavioral: The patient is nervous/anxious.   All other systems reviewed and are negative.      Objective:   Physical Exam  Constitutional: She is oriented to person, place, and time. She appears well-developed and well-nourished. No distress.  HENT:  Head: Normocephalic and atraumatic.  Eyes: Pupils are equal, round, and reactive to light.  Neck: Normal range of motion. Neck supple. No thyromegaly present.  Cardiovascular: Normal rate, regular rhythm, normal heart sounds and intact distal pulses.  No murmur heard. Pulmonary/Chest: Effort normal. No respiratory distress. She has decreased breath sounds. She has no wheezes.  Abdominal: Soft. Bowel sounds are normal. She exhibits no distension. There is no tenderness.  Musculoskeletal: Normal range of motion. She exhibits no edema or tenderness.    Neurological: She is alert and oriented to person, place, and time. She has normal reflexes. No cranial nerve deficit.  Skin: Skin is warm and dry.  Psychiatric: Her behavior is normal. Judgment and thought content normal. Her mood appears anxious.  Tearful at times  Vitals reviewed.   BP 129/69   Pulse 85   Temp (!) 96.8 F (36 C) (Oral)   Ht 4\' 9"  (1.448 m)   Wt 96 lb 12.8 oz (43.9 kg)   BMI 20.95 kg/m      Assessment & Plan:  [name] comes in today with chief complaint of ER follow up   Diagnosis and orders addressed:  1. Interstitial lung disease ([place]) - DME Nebulizer machine - albuterol (PROVENTIL) (2.5 MG/3ML) 0.083% nebulizer solution; Take 3 mLs (2.5 mg total) by nebulization every 6 (six) hours as needed for wheezing or shortness of breath.  Dispense: 150 mL; Refill: 1  2. Anxiety - busPIRone (BUSPAR) 5 MG tablet; Take 1 tablet (5 mg total) by mouth 3 (three) times daily.  Dispense: 90 tablet; Refill: 0  3. Panic attack - busPIRone (BUSPAR) 5 MG tablet; Take 1 tablet (5 mg total) by mouth 3 (three) times daily.  Dispense: 90 tablet; Refill: 0  4. Hospital discharge follow-up - busPIRone (BUSPAR) 5 MG tablet; Take 1 tablet (5 mg total) by mouth 3 (three) times daily.  Dispense: 90 tablet; Refill: 0   Hospital notes reviewed I will give rx for nebulizer machine at home. However, I believe most of her SOB is due  to anixety and panic attacks and not from her interstitial lung disease. I will start her on Buspar 5 mg TID prn for anxiety. I want her to try to use the Buspar before doing a breathing treatment to see if this will help. I discussed if she uses the albuterol too frequent it could cause her tachycardia and make her anxiety worse. She will follow up with her PCP.   Cathy Dun, FNP

## 2017-12-08 NOTE — Patient Instructions (Signed)

## 2017-12-15 ENCOUNTER — Ambulatory Visit (INDEPENDENT_AMBULATORY_CARE_PROVIDER_SITE_OTHER): Payer: Medicare Other | Admitting: *Deleted

## 2017-12-15 VITALS — BP 155/77 | HR 90

## 2017-12-15 DIAGNOSIS — E538 Deficiency of other specified B group vitamins: Secondary | ICD-10-CM

## 2017-12-15 NOTE — Progress Notes (Signed)
Vitamin b12 injection given and patient tolerated well.  

## 2017-12-15 NOTE — Patient Instructions (Signed)
Cyanocobalamin, Vitamin B12 injection What is this medicine? CYANOCOBALAMIN (sye an oh koe BAL a min) is a man made form of vitamin B12. Vitamin B12 is used in the growth of healthy blood cells, nerve cells, and proteins in the body. It also helps with the metabolism of fats and carbohydrates. This medicine is used to treat people who can not absorb vitamin B12. This medicine may be used for other purposes; ask your health care provider or pharmacist if you have questions. COMMON BRAND NAME(S): B-12 Compliance Kit, B-12 Injection Kit, Cyomin, LA-12, Nutri-Twelve, Physicians EZ Use B-12, Primabalt What should I tell my health care provider before I take this medicine? They need to know if you have any of these conditions: -kidney disease -Leber's disease -megaloblastic anemia -an unusual or allergic reaction to cyanocobalamin, cobalt, other medicines, foods, dyes, or preservatives -pregnant or trying to get pregnant -breast-feeding How should I use this medicine? This medicine is injected into a muscle or deeply under the skin. It is usually given by a health care professional in a clinic or doctor's office. However, your doctor may teach you how to inject yourself. Follow all instructions. Talk to your pediatrician regarding the use of this medicine in children. Special care may be needed. Overdosage: If you think you have taken too much of this medicine contact a poison control center or emergency room at once. NOTE: This medicine is only for you. Do not share this medicine with others. What if I miss a dose? If you are given your dose at a clinic or doctor's office, call to reschedule your appointment. If you give your own injections and you miss a dose, take it as soon as you can. If it is almost time for your next dose, take only that dose. Do not take double or extra doses. What may interact with this medicine? -colchicine -heavy alcohol intake This list may not describe all possible  interactions. Give your health care provider a list of all the medicines, herbs, non-prescription drugs, or dietary supplements you use. Also tell them if you smoke, drink alcohol, or use illegal drugs. Some items may interact with your medicine. What should I watch for while using this medicine? Visit your doctor or health care professional regularly. You may need blood work done while you are taking this medicine. You may need to follow a special diet. Talk to your doctor. Limit your alcohol intake and avoid smoking to get the best benefit. What side effects may I notice from receiving this medicine? Side effects that you should report to your doctor or health care professional as soon as possible: -allergic reactions like skin rash, itching or hives, swelling of the face, lips, or tongue -blue tint to skin -chest tightness, pain -difficulty breathing, wheezing -dizziness -red, swollen painful area on the leg Side effects that usually do not require medical attention (report to your doctor or health care professional if they continue or are bothersome): -diarrhea -headache This list may not describe all possible side effects. Call your doctor for medical advice about side effects. You may report side effects to FDA at 1-800-FDA-1088. Where should I keep my medicine? Keep out of the reach of children. Store at room temperature between 15 and 30 degrees C (59 and 85 degrees F). Protect from light. Throw away any unused medicine after the expiration date. NOTE: This sheet is a summary. It may not cover all possible information. If you have questions about this medicine, talk to your doctor, pharmacist, or   health care provider.  2018 Elsevier/Gold Standard (2007-04-13 22:10:20)  

## 2017-12-23 ENCOUNTER — Ambulatory Visit (INDEPENDENT_AMBULATORY_CARE_PROVIDER_SITE_OTHER): Payer: Medicare Other | Admitting: Adult Health

## 2017-12-23 ENCOUNTER — Encounter: Payer: Self-pay | Admitting: Adult Health

## 2017-12-23 DIAGNOSIS — I2510 Atherosclerotic heart disease of native coronary artery without angina pectoris: Secondary | ICD-10-CM | POA: Diagnosis not present

## 2017-12-23 DIAGNOSIS — J849 Interstitial pulmonary disease, unspecified: Secondary | ICD-10-CM

## 2017-12-23 NOTE — Patient Instructions (Addendum)
Continue on current regimen  Continue on BREO daily.   Use albuterol neb as needed.  Follow up with Dr. Chase Caller as planned  and As needed   Please contact office for sooner follow up if symptoms do not improve or worsen or seek emergency care

## 2017-12-23 NOTE — Assessment & Plan Note (Signed)
Recent mild flare with possible anxiety component.  Patient seems to be improved and back to her baseline. Have advised her to do activity as tolerated.  Discussed with her primary care physician she might benefit from PT at home   Plan  Patient Instructions  Continue on current regimen  Continue on BREO daily.   Use albuterol neb as needed.  Follow up with Dr. Chase Caller as planned  and As needed   Please contact office for sooner follow up if symptoms do not improve or worsen or seek emergency care

## 2017-12-23 NOTE — Progress Notes (Signed)
@Patient  ID: [name], female    DOB: 30-Apr-1925, 84 y.o.   MRN: [id number]  Chief Complaint  Patient presents with  . Acute Visit    Cough     Referring provider: Chipper Herb, MD  HPI: 84 year old female never smoker followed for pulmonary fibrosis and pulmonary nodule   TEST  CT [facility] dec 2018 Lungs/Pleura: Extensive subpleural reticulation, interstitial coarsening and traction bronchiectasis/bronchiolectasis without a definite zonal predominance. Findings are progressive from 07/11/2010. 5 mm subpleural right lower lobe nodule (image 93), unchanged from 2012 and considered benign. No pleural fluid. Airway is unremarkable.   12/23/17 Follow up : IPF , ER follow up  Patient presents for a follow-up from recent urgency room visit.  Patient had increased shortness of breath and went to the emergency room.  CT chest shows stable emphysema and pulmonary fibrosis.  Labs were unrevealing with negative troponin.  Patient was given a nebulizer treatment with improved symptom management.  Also was started on some antianxiety medicine.  As it was felt that anxiety was contributing to several of her symptoms.  Patient had a recent death of a close friend.  She denies any fever chest pain orthopnea PND or increased leg swelling presents emergency room visit patient says she is feeling better her anxiety has been under better control.  She still gets winded with activity but seems to be improving .   Patient lives by herself.  Does light housework.  Is able to cook and sew.   Allergies  Allergen Reactions  . Mucinex [Guaifenesin Er] Other (See Comments)    Causes anxiety  . Levaquin [Levaquin Leva-Pak]     Patient cannot remember that she is sensitive to this medication; has never heard of it.  . Erythromycin Diarrhea  . Penicillins Rash    Immunization History  Administered Date(s) Administered  . Influenza Split 10/28/2012  . Influenza Whole 09/14/2009  . Influenza, High  Dose Seasonal PF 10/07/2016, 11/11/2017  . Influenza, Quadrivalent, Recombinant, Inj, Pf 11/08/2015  . Influenza,inj,Quad PF,6+ Mos 10/24/2014  . Influenza,inj,quad, With Preservative 11/10/2013  . Pneumococcal Conjugate-13 12/31/2012  . Pneumococcal Polysaccharide-23 10/14/1996  . Td 10/15/2002  . Zoster 02/05/2010    Past Medical History:  Diagnosis Date  . Arthritis   . Cataract   . Chronic back pain   . Glaucoma   . Hyperlipidemia   . Idiopathic pulmonary fibrosis ([place])   . Lumbar stenosis   . Osteoarthrosis and allied disorders   . Osteoporosis   . Peripheral neuropathy   . Vitamin D deficiency     Tobacco History: Social History   Tobacco Use  Smoking Status Never Smoker  Smokeless Tobacco Never Used   Counseling given: Not Answered   Outpatient Medications Prior to Visit  Medication Sig Dispense Refill  . albuterol (PROVENTIL) (2.5 MG/3ML) 0.083% nebulizer solution Take 3 mLs (2.5 mg total) by nebulization every 6 (six) hours as needed for wheezing or shortness of breath. 150 mL 1  . albuterol (VENTOLIN HFA) 108 (90 Base) MCG/ACT inhaler 2 puffs every 4-6 hours as needed for wheezing, shortness of breath    . alendronate (FOSAMAX) 70 MG tablet TAKE 1 TABLET WEEKLY (TAKE WITH 8OZ OF WATER 30 MINUTES BEFORE BREAKFAST) 12 tablet 2  . aspirin EC 81 MG tablet Take 1 tablet (81 mg total) by mouth daily. 90 tablet 3  . BREO ELLIPTA 100-25 MCG/INH AEPB USE 1 INHALATION DAILY 60 each 5  . busPIRone (BUSPAR) 5 MG tablet Take  1 tablet (5 mg total) by mouth 3 (three) times daily. 90 tablet 0  . calcium carbonate (OS-CAL) 600 MG TABS tablet Take 600 mg by mouth daily with breakfast.    . cephALEXin (KEFLEX) 500 MG capsule Take 1 capsule by mouth 3 (three) times daily. X 1 week - then 1 nightly (dr Jeffie Pollock)    . cholecalciferol (VITAMIN D) 1000 UNITS tablet Take 1,000 Units by mouth daily.    . fluticasone (FLONASE) 50 MCG/ACT nasal spray USE 2 SPRAYS IN EACH NOSTRIL DAILY 16 g  11  . ibuprofen (ADVIL,MOTRIN) 200 MG tablet Take 200 mg by mouth every 6 (six) hours as needed.    . megestrol (MEGACE) 40 MG tablet Take 20 mg by mouth 2 (two) times daily.    . Multiple Vitamins-Minerals (CENTRUM SILVER) tablet Take 1 tablet by mouth every evening.    . nitroGLYCERIN (NITROSTAT) 0.4 MG SL tablet Place 1 tablet (0.4 mg total) under the tongue every 5 (five) minutes as needed for chest pain. 25 tablet 3  . ondansetron (ZOFRAN-ODT) 4 MG disintegrating tablet     . predniSONE (DELTASONE) 5 MG tablet     . ranitidine (ZANTAC) 150 MG tablet Take 150 mg by mouth 2 (two) times daily.    . simvastatin (ZOCOR) 40 MG tablet TAKE ONE (1) TABLET EACH DAY 90 tablet 1   Facility-Administered Medications Prior to Visit  Medication Dose Route Frequency Provider Last Rate Last Dose  . cyanocobalamin ((VITAMIN B-12)) injection 1,000 mcg  1,000 mcg Intramuscular Q30 days Chipper Herb, MD   1,000 mcg at 12/15/17 1433     Review of Systems  Constitutional:   No  weight loss, night sweats,  Fevers, chills, + fatigue, or  lassitude.  HEENT:   No headaches,  Difficulty swallowing,  Tooth/dental problems, or  Sore throat,                No sneezing, itching, ear ache, nasal congestion, post nasal drip,   CV:  No chest pain,  Orthopnea, PND, swelling in lower extremities, anasarca, dizziness, palpitations, syncope.   GI  No heartburn, indigestion, abdominal pain, nausea, vomiting, diarrhea, change in bowel habits, loss of appetite, bloody stools.   Resp:   No chest wall deformity  Skin: no rash or lesions.  GU: no dysuria, change in color of urine, no urgency or frequency.  No flank pain, no hematuria   MS:  No joint pain or swelling.  No decreased range of motion.  No back pain.    Physical Exam  BP 122/74 (BP Location: Left Arm, Cuff Size: Normal)   Pulse 78   Ht 4\' 9"  (1.448 m)   Wt 97 lb (44 kg)   SpO2 97%   BMI 20.99 kg/m   GEN: A/Ox3; pleasant , NAD, frail elderly     HEENT:  [place]/AT,  EACs-clear, TMs-wnl, NOSE-clear, THROAT-clear, no lesions, no postnasal drip or exudate noted.   NECK:  Supple w/ fair ROM; no JVD; normal carotid impulses w/o bruits; no thyromegaly or nodules palpated; no lymphadenopathy.    RESP bibasilar crackles  no accessory muscle use, no dullness to percussion  CARD:  RRR, no m/r/g, no peripheral edema, pulses intact, no cyanosis or clubbing.  GI:   Soft & nt; nml bowel sounds; no organomegaly or masses detected.   Musco: Warm bil, no deformities or joint swelling noted.   Neuro: alert, no focal deficits noted.    Skin: Warm, no lesions or rashes  Lab Results:  CBC  BNP No results found for: BNP  ProBNP No results found for: PROBNP  Imaging: No results found.  cyanocobalamin ((VITAMIN B-12)) injection 1,000 mcg    Date Action Dose Route User   12/15/2017 1433 Given 1000 mcg Intramuscular (Right Deltoid) Thana Ates, LPN   Discharged on 82/95/6213   Admitted on 12/05/2017   11/11/2017 1044 Given 1000 mcg Intramuscular (Left Deltoid) Rutherford, Lanelle Bal K      No flowsheet data found.  No results found for: NITRICOXIDE      Assessment & Plan:   Interstitial lung disease ([place]) Recent mild flare with possible anxiety component.  Patient seems to be improved and back to her baseline. Have advised her to do activity as tolerated.  Discussed with her primary care physician she might benefit from PT at home   Plan  Patient Instructions  Continue on current regimen  Continue on BREO daily.   Use albuterol neb as needed.  Follow up with Dr. Chase Caller as planned  and As needed   Please contact office for sooner follow up if symptoms do not improve or worsen or seek emergency care           Rexene Edison, NP 12/23/2017

## 2017-12-26 ENCOUNTER — Telehealth: Payer: Self-pay | Admitting: Family Medicine

## 2017-12-26 NOTE — Telephone Encounter (Signed)
Pt scheduled with [facility] 02/11/18 at 3:00 for f/u.

## 2017-12-26 NOTE — Telephone Encounter (Signed)
[name] is calling for pt, is needing an apt for Jan with Dr Laurance Flatten, daughter states that the pt is needing to be seen sometime then due to her being in hospital recently.

## 2018-01-12 ENCOUNTER — Telehealth: Payer: Self-pay | Admitting: Family Medicine

## 2018-01-12 ENCOUNTER — Other Ambulatory Visit: Payer: Self-pay | Admitting: Family Medicine

## 2018-01-12 ENCOUNTER — Encounter: Payer: Self-pay | Admitting: Family Medicine

## 2018-01-12 ENCOUNTER — Ambulatory Visit (INDEPENDENT_AMBULATORY_CARE_PROVIDER_SITE_OTHER): Payer: Medicare Other | Admitting: Family Medicine

## 2018-01-12 VITALS — BP 148/75 | HR 85 | Temp 98.2°F | Ht <= 58 in | Wt 98.2 lb

## 2018-01-12 DIAGNOSIS — F410 Panic disorder [episodic paroxysmal anxiety] without agoraphobia: Secondary | ICD-10-CM

## 2018-01-12 DIAGNOSIS — I2510 Atherosclerotic heart disease of native coronary artery without angina pectoris: Secondary | ICD-10-CM

## 2018-01-12 DIAGNOSIS — J329 Chronic sinusitis, unspecified: Secondary | ICD-10-CM | POA: Diagnosis not present

## 2018-01-12 DIAGNOSIS — J40 Bronchitis, not specified as acute or chronic: Secondary | ICD-10-CM | POA: Diagnosis not present

## 2018-01-12 DIAGNOSIS — Z09 Encounter for follow-up examination after completed treatment for conditions other than malignant neoplasm: Secondary | ICD-10-CM | POA: Diagnosis not present

## 2018-01-12 DIAGNOSIS — F419 Anxiety disorder, unspecified: Secondary | ICD-10-CM

## 2018-01-12 MED ORDER — BUSPIRONE HCL 5 MG PO TABS: 5.0000 mg | ORAL_TABLET | Freq: Three times a day (TID) | ORAL | 0 refills | Status: DC

## 2018-01-12 MED ORDER — LEVOFLOXACIN 250 MG PO TABS: 250.0000 mg | ORAL_TABLET | Freq: Every day | ORAL | 0 refills | Status: DC

## 2018-01-12 MED ORDER — DOXYCYCLINE HYCLATE 100 MG PO CAPS: 100.0000 mg | ORAL_CAPSULE | Freq: Two times a day (BID) | ORAL | 0 refills | Status: DC

## 2018-01-12 MED ORDER — HYDROCODONE-HOMATROPINE 5-1.5 MG/5ML PO SYRP: 5.0000 mL | ORAL_SOLUTION | Freq: Four times a day (QID) | ORAL | 0 refills | Status: AC | PRN

## 2018-01-12 NOTE — Telephone Encounter (Signed)
I sent in Doxycycline. No need to take keflex or levaquiin

## 2018-01-12 NOTE — Telephone Encounter (Signed)
Please contact the patient. There is no need to take it, because the new one is stronger. WS

## 2018-01-12 NOTE — Telephone Encounter (Signed)
Does she need to stop her other antibiotic that she takes dialy?

## 2018-01-12 NOTE — Telephone Encounter (Signed)
Pt's daughter called back after she took Levaquin and had the shakes and they called EMS due to allergic reaction per Caren Griffins.

## 2018-01-12 NOTE — Telephone Encounter (Signed)
Patients daughter aware

## 2018-01-12 NOTE — Telephone Encounter (Signed)
Pt takes the Keflex for maintenance from Dr Jeffie Pollock, see other call on pt, she had allergic reaction to Levaquin and called EMS.

## 2018-01-12 NOTE — Progress Notes (Signed)
Chief Complaint  Patient presents with  . Cough    pt here today c/o cough/congestion since Friday and Delsym isn't helping   ellow HPI  Patient presents today for Patient presents with upper respiratory congestion. Rhinorrhea that is frequently purulent. There is moderate sore throat. Patient reports coughing frequently as well.  y sputum noted. There is no fever, chills or sweats. The patient denies being short of breath. Onset was 2 days ago.Getting worse.Tried OTCs without improvement.  PMH: Smoking status noted ROS: Per HPI  Objective: BP (!) 148/75   Pulse 85   Temp 98.2 F (36.8 C) (Oral)   Ht 4\' 9"  (1.448 m)   Wt 98 lb 4 oz (44.6 kg)   BMI 21.26 kg/m  Gen: NAD, alert, cooperative with exam HEENT: NCAT, Nasal passages swollen, red TMS clear CV: RRR, good S1/S2, no murmur Resp: Bronchitis changes with scattered wheezes, non-labored Ext: No edema, warm Neuro: Alert and oriented, No gross deficits  Assessment and plan:  1. Sinobronchitis   2. Anxiety   3. Panic attack   4. Hospital discharge follow-up     Meds ordered this encounter  Medications  . busPIRone (BUSPAR) 5 MG tablet    Sig: Take 1 tablet (5 mg total) by mouth 3 (three) times daily.    Dispense:  90 tablet    Refill:  0  . HYDROcodone-homatropine (HYCODAN) 5-1.5 MG/5ML syrup    Sig: Take 5 mLs by mouth every 6 (six) hours as needed for up to 5 days for cough.    Dispense:  100 mL    Refill:  0  . levofloxacin (LEVAQUIN) 250 MG tablet    Sig: Take 1 tablet (250 mg total) by mouth daily.    Dispense:  7 tablet    Refill:  0    No orders of the defined types were placed in this encounter.   Follow up as needed.  Claretta Fraise, MD

## 2018-01-12 NOTE — Telephone Encounter (Signed)
I sent in doxycycline as a substitute

## 2018-01-15 ENCOUNTER — Ambulatory Visit (INDEPENDENT_AMBULATORY_CARE_PROVIDER_SITE_OTHER): Payer: Medicare Other | Admitting: *Deleted

## 2018-01-15 ENCOUNTER — Other Ambulatory Visit: Payer: Self-pay | Admitting: Family Medicine

## 2018-01-15 DIAGNOSIS — E538 Deficiency of other specified B group vitamins: Secondary | ICD-10-CM | POA: Diagnosis not present

## 2018-01-15 NOTE — Progress Notes (Signed)
Pt given cyanocobalamin inj Tolerated well 

## 2018-01-20 ENCOUNTER — Other Ambulatory Visit: Payer: Self-pay | Admitting: Family Medicine

## 2018-01-22 ENCOUNTER — Encounter: Payer: Self-pay | Admitting: Internal Medicine

## 2018-01-22 ENCOUNTER — Ambulatory Visit (INDEPENDENT_AMBULATORY_CARE_PROVIDER_SITE_OTHER): Payer: Medicare Other | Admitting: Internal Medicine

## 2018-01-22 VITALS — BP 118/64 | HR 76 | Ht <= 58 in | Wt 100.4 lb

## 2018-01-22 DIAGNOSIS — J209 Acute bronchitis, unspecified: Secondary | ICD-10-CM

## 2018-01-22 DIAGNOSIS — F329 Major depressive disorder, single episode, unspecified: Secondary | ICD-10-CM | POA: Diagnosis not present

## 2018-01-22 DIAGNOSIS — J84112 Idiopathic pulmonary fibrosis: Secondary | ICD-10-CM

## 2018-01-22 DIAGNOSIS — F32A Depression, unspecified: Secondary | ICD-10-CM

## 2018-01-22 MED ORDER — PREDNISONE 10 MG PO TABS: ORAL_TABLET | ORAL | 0 refills | Status: DC

## 2018-01-22 NOTE — Progress Notes (Signed)
HPI  PCP Redge Gainer, MD   IOV 07/07/2015  Chief Complaint  Patient presents with  . Advice Only    Referred by Dr. Laurance Flatten for pulmonary nodule.       85 year old female accompanied by her daughter [name]. Referred for pulmonary nodule. According to the daughter and the patient will Sr. he is not fully clear with a long-standing history of pulmonary nodule that has been followed locally for many years review of the chart shows that she's had CT scan of the chest for many years. The CT scans of the chest. All the way back to 2004/2006 and described pulmonary fibrosis and nodule. I only have the images from 2014 that shows bilateral upper lobe pulmonary fibrosis with some involvement in the lower lobes. Non-UIP pattern. Most recently she had CT scan of the chest 06/30/2015. According to the daughter ration had some infectious symptoms at this time but again the CT was being done to evaluate for follow-up nodule and a new nodule was discovered which is described to be in the right upper lobe 6 mm and therefore has been referred here. Currently patient is feeling well baseline which is just mild intermittent occasional cough and shortness of breath with wheezing for which she is on maintenance inhaler possibly Brio which helps. Overall quality of life is fine and she does not wish for aggressive intervention.  I personally visualized the CT chest below. In terms of pulmonary fibrosis she is unaware of the diagnosis. Inour chart she's not had any autoimmune workup. She does live in an extremely old house over 45 years old. They think there might be some mold in it. She has chronic arthralgia but no diagnosed autoimmune disease   CT chest 06/30/15 EXAM: CT CHEST WITHOUT CONTRAST  TECHNIQUE: Multidetector CT imaging of the chest was performed following the standard protocol without IV contrast.  COMPARISON: Chest radiograph 08/29/2015  FINDINGS: Mediastinum/Lymph Nodes: There are  multiple borderline enlarged mediastinal lymph nodes, with reactive appearance. The heart is mildly enlarged. There is no pericardial effusion. Calcified atherosclerotic disease of the coronary arteries is seen.  Lungs/Pleura: There is no evidence of pneumothorax, pleural effusion or acute airspace consolidation. There are subpleural with upper lobe predominance fibrotic changes of the lungs bilaterally with coarsening of the interstitial markings, mild honeycombing and traction bronchiectasis. An area of scarring versus pulmonary nodule is seen in the subpleural right upper lobe and measures 6.4 mm (image 40/148, sequence 3). Mild pleural thickening is noted anteriorly at the level of the right middle lobe/ lingula.  Upper abdomen: No acute findings.  Musculoskeletal: No chest wall mass or suspicious bone lesions identified.  IMPRESSION: Upper lobe predominant chronic interstitial lung disease.  Multiple borderline enlarged mediastinal lymph nodes.  Differential diagnosis includes ankylosing spondylitis, sarcoidosis, silicosis, eosinophilic granuloma or prior granulomatous infection such as tuberculosis.  6.4 mm right upper lobe pulmonary nodule versus area of prominent scarring. Initial follow-up with CT at 6-12 months is recommended to confirm persistence. If persistent, repeat CT is recommended every 2 years until 5 years of stability has been established. This recommendation follows the consensus statement: Guidelines for Management of Incidental Pulmonary Nodules Detected on CT Images:From the Fleischner Society 2017; published online before print (10.1148/radiol.[id number]).   Electronically Signed  By: Fidela Salisbury M.D.  On: 06/30/2015 17:20  OV 02/15/2016  Chief Complaint  Patient presents with  . Follow-up    Pt here after CT chest. Pt states her breathing is unchanged since last OV.  Pt denies cough and CP/tightness.    Follow-up 6 mm right  upper lobe nodule  In follow-up interstitial lung disease not otherwise specified base in June 2017 CT chest  She had follow-up CT chest recommended below that at personally visualized with her and her daughter [name]. According to me the right upper lobe nodule is stable and I agree with the report. The interstitial lung disease pattern appears to be one of possible UIP. She has an elevated rheumatoid factor but negative CCP. Did tell me that they saw Dr. Trudie Reed rheumatologist and has been reassured that there is no systemic evidence of rheumatoid arthritis. I do not have the chart with me. Overall she's stable but does get dyspneic doing gardening and then relieved by rest and it is no chest pain. She wants to continue to be as physically active as possible she is not interested in pulmonary rehabilitation.  Of note I did review her CT chest in 2013 and 2017. Today I think ILD slowly progressive IMPRESSION: 1. Stable CT of the chest. 2. Similar appearance of chronic interstitial lung disease compatible with pulmonary fibrosis. This may be secondary to nonspecific interstitial pneumonia or usual interstitial pneumonia (UIP.) 3. Small pulmonary nodules are again noted and appears stable from 06/30/2015. Non-contrast chest CT can be considered at 12 months (from 07/01/2015) if patient is high-risk. This recommendation follows the consensus statement: Guidelines for Management of Incidental Pulmonary Nodules Detected on CT Images: From the Fleischner Society 2017; Radiology 2017; [id number]. 4.   Electronically Signed   By: Kerby Moors M.D.   On: 12/19/2015 14:54  OV 08/09/2016  Chief Complaint  Patient presents with  . Follow-up    Pt states her breathing is unchanged sicne last OV. Pt states she has a dry cough. Pt denies CP/tightness and f/c/s.    Follow-up idiopathic pulmonary fibrosis diagnosis based based on age, possible UIP on CT scan and negative rheumatology evaluation.  Diagnosis given 02/15/2016  Follow-up lung nodule  Overall she's doing stable. Today in the office she walked coronary 5 feet 3 laps on room air: She was unable to do  and then stopped because of hip pain and knee pain is chronic issues. She continues to live alone and her daughter [name] is with her today. She is functional in the house able to gardening but then stopped because of dyspnea. She does have some wheeze for which she takes Brio although there is no emphysema on the CT scan.   OV 10/03/2016    Chief Complaint  Patient presents with  . Follow-up    Pt attempted to do the PFT but was unable to complete. Denies any comlaints or concerns. Occ. SOB. Denies any CP or cough.    Follow-up IPF on basic supportive care. Last saw her in June 2018. At this point now she is stable. We will gardening. She complains of dyspnea on exertion relieved by rest it is mild to moderate. It is not any worse. She could not do her PFTs today. In the past walked this also failed. I told her that we will just follow her symptoms. She is but is pitting in the IPF registry by BI . Daugher [name] with her  follow-up lung nodule: She has her next scans December 18   OV 02/03/2017   Chief Complaint  Patient presents with  . Follow-up    CT scan done 12/16/16.     Pt states she has been doing good since last visit. Breathing is  the same as it was at last visit. Pt still losing weight.    Follow-up idiopathic interstitial ILD: Clinical diagnosis of IPF given based on age and also presence o of traction bronchiectasis and also negative autoimmune profile.  Overall clinically fine.  She is doing gardening and cooking and doing activities of daily living without any chest pain or shortness of breath.  Her CT chest in dec 2018 shows prgression since 2012. However, asymptomatic and Walking desaturation test on 02/03/2017 185 feet x 3 laps on ROOM AIR:  did NOT desaturate. Rest pulse ox was 100%, final pulse ox was  100%. HR response 74/min at rest to 83/min at peak exertion. Patient [name]  Did not Desaturate < 88% . [name] did not  Desaturated </= 3% points. [name] did not get tachyardic. OF note, she is in IPF Registry and she did  A visit with [place] with blood test and questionnaire. She still is interested in continuing that   Lung nodule 41mm sable 2012 -> 2018   Ne finding  does have coronary artery calcification on CT - no chest pain. Prefers expectant followup   CT [facility] dec 2018 Lungs/Pleura: Extensive subpleural reticulation, interstitial coarsening and traction bronchiectasis/bronchiolectasis without a definite zonal predominance. Findings are progressive from 07/11/2010. 5 mm subpleural right lower lobe nodule (image 93), unchanged from 2012 and considered benign. No pleural fluid. Airway is unremarkable.  Upper Abdomen: Visualized portions of the liver, adrenal glands, kidneys, spleen, pancreas, stomach and bowel are grossly unremarkable.  Musculoskeletal: Degenerative changes in the spine. No worrisome lytic or sclerotic lesions.  IMPRESSION: 1. Fibrotic interstitial lung disease has progressed from 07/11/2010 and may be due to fibrotic nonspecific interstitial pneumonitis. Distribution is considered somewhat non typical for usual interstitial pneumonitis given areas of more central interstitial involvement as well as lack of a strong craniocaudal gradient, but is not excluded. 2. Aortic atherosclerosis (ICD10-170.0). Severe coronary artery calcification. 3. Enlarged pulmonary arteries, indicative of pulmonary arterial hypertension.   Electronically Signed   By: Lorin Picket M.D.   On: 12/16/2016 12:18   03/24/2017 Follow up : IPF , ER follow up  Patient presents for a follow-up visit.  She was recently seen in the emergency room at [facility]. 2 weeks ago.  She had increased shortness of breath and chest pain. Says ER workup was  reported neg for cardiac. CT chest showed no PE , stable chronic changes.  EKG was reported as Left BBB.  No further episodes.  No FH of CAD. No HTN . + hyperlipidemia on statin .    Patient has underlying IPF, clinical diagnosis, has been essentially stable.  On supportive care.  Most recent CT chest in December 2018 show some progression since 2012 but overall felt to be clinically stable. Remains active , does all housework and gardening . Does not drive.(has never driven)   Continues to have weight loss. On ensure. Says PCP has worked up but has not found an  OV 05/27/2017  Chief Complaint  Patient presents with  . Follow-up    Pt had a spontaneous pneumothorax per pt's daughter.  Pt went to ER 02/2017 due to CP and was thinking it was angina and went back to ER 04/2017 with the same pain in chest and pain ended up being due to a pneumothorax.  Since the ER visit when pt was given pain meds, she has been doing good.   Follow-up idiopathic pulmonary fibrosis [clinical diagnosis] basic  supportive care   Routine follow-up for this IPF patient.  Last seen by myself in January 2019.  Then in February 2019 ended up in [facility] emergency department for chest pains.  She followed up with nurse practitioner here in April 2019.  Subsequent to that visit on May 04, 2017 she developed another episode of left-sided chest pain went back to [facility] emergency department.  PE ruled out.  Had a CT scan of the chest that I personally visualized and it showed a small less than 5% apical pneumothorax and in retrospect this was also present in the February 2019 CT chest although it definitely has progressed in April 2019.  I personally visualized and confirmed these findings.  The daughter has a stack of records with her that I personally reviewed.  These are from the [facility] emergency department and basically confirmed this.  The treatment elected was observation.  At this point in time patient and her daughter  report that the patient is functional without any symptoms.  She is does gardening.  The main issue is that she is continuing to lose weight.  Walking desaturation test in our office today as documented below and was essentially normal.   OV 09/05/2017  Chief Complaint  Patient presents with  . Follow-up    Pt states she has been fatigued since last visit but other than that she has been doing good. Pt states she has had some mild SOB, and an occ cough.   Follow-up idiopathic pulmonary fibrosis in this frail elderly woman. She presents with her daughter for routine follow-up. Today she was supposed to do the IPF registry questionnaire and blood test but she cited fatigue and reluctance to do it but she still wants to continue with the study. There are no interim complaints other than fatigue that is ongoing and weight loss but when we walked her oxygen level stayed normal as documented below. She is able to do her activities of daily living but just that she is more fatigued.Daughter says primary can physician has investigated this and so far unable to find an etiology. However there is no deterioration in her IPF. Daughter is beginning to suspect of cephalexin given for recurrent UTI is the cause. Patient is not on nitrofurantoin. Earlier this year she had secondary spontaneous pneumothorax that was managed expectantly and a chest x-ray with [place] in May 2019 did not show any pneumothorax. They're willing to have another chest x-ray      OV 01/22/2018  Subjective:  Patient ID: [name], female , DOB: Dec 10, 1925 , age 69 y.o. , MRN: [id number] , ADDRESS: [address] [place] [address]   01/22/2018 -   Chief Complaint  Patient presents with  . Follow-up    Pt is still having problems with SOB which can happen at any time and pt also has a productive cough and has also been having problems with shakes. Pt was diagnosed with bronchitis 01/12/18 and is currently on abx doxy. Denies any  complaints of CP/chest tightness.     HPI [name] 85 y.o. -ILD presumed IPF follow-up.  On supportive care.  She is here with her daughter and granddaughter.  Overall she is stable.  She does her ADLs slowly.  Cognitively intact.  Very sharp.  Most recently had acute bronchitis given Levaquin but she was allergic to it and then given doxycycline.  She still has a cough.  The family wants prednisone to get rid of the cough.  They are also reporting new onset of depression after her friend died.  Granddaughter also admits patient has seasonal affective symptoms particularly in the winter when she gets more depressed.  They have never tried light therapy.  Otherwise stable.     Simple office walk 185 feet x  3 laps goal with forehead probe 05/27/2017  09/05/2017  01/22/2018 Could not do  O2 used Room air Room air   Number laps completed 3 3   Comments about pace ver slow but independent    Resting Pulse Ox/HR 100% and 65/min 98% and HR 73/min   Final Pulse Ox/HR 99% and 88/min 98% and 84/min   Desaturated </= 88% no no   Desaturated <= 3% points no no   Got Tachycardic >/= 90/min  no   Symptoms at end of test Mild fatigute and dyspne    Miscellaneous comments stable      ROS - per HPI     has a past medical history of Arthritis, Cataract, Chronic back pain, Glaucoma, Hyperlipidemia, Idiopathic pulmonary fibrosis (HCC), Lumbar stenosis, Osteoarthrosis and allied disorders, Osteoporosis, Peripheral neuropathy, and Vitamin D deficiency.   reports that she has never smoked. She has never used smokeless tobacco.  Past Surgical History:  Procedure Laterality Date  . ABDOMINAL HYSTERECTOMY    . CATARACT EXTRACTION    . Cataract surgery Bilateral   . Eyelid surgery    . KNEE ARTHROSCOPY    . LUMBAR LAMINECTOMY      Allergies  Allergen Reactions  . Levaquin [Levaquin Leva-Pak] Shortness Of Breath and Palpitations    Patient cannot remember that she is sensitive to this  medication; has never heard of it.  . Mucinex [Guaifenesin Er] Other (See Comments)    Causes anxiety  . Bentyl [Dicyclomine Hcl] Other (See Comments)    shakes  . Erythromycin Diarrhea  . Penicillins Rash    Immunization History  Administered Date(s) Administered  . Influenza Split 10/28/2012  . Influenza Whole 09/14/2009  . Influenza, High Dose Seasonal PF 10/07/2016, 11/11/2017  . Influenza, Quadrivalent, Recombinant, Inj, Pf 11/08/2015  . Influenza,inj,Quad PF,6+ Mos 10/24/2014  . Influenza,inj,quad, With Preservative 11/10/2013  . Pneumococcal Conjugate-13 12/31/2012  . Pneumococcal Polysaccharide-23 10/14/1996  . Td 10/15/2002  . Zoster 02/05/2010    Family History  Problem Relation Age of Onset  . Bone cancer Mother   . Breast cancer Sister   . Diabetes Brother   . Asthma Daughter   . COPD Son   . Asthma Son      Current Outpatient Medications:  .  albuterol (PROVENTIL) (2.5 MG/3ML) 0.083% nebulizer solution, Take 3 mLs (2.5 mg total) by nebulization every 6 (six) hours as needed for wheezing or shortness of breath., Disp: 150 mL, Rfl: 1 .  albuterol (VENTOLIN HFA) 108 (90 Base) MCG/ACT inhaler, 2 puffs every 4-6 hours as needed for wheezing, shortness of breath, Disp: , Rfl:  .  alendronate (FOSAMAX) 70 MG tablet, TAKE 1 TABLET WEEKLY (TAKE WITH 8OZ OF WATER 30 MINUTES BEFORE BREAKFAST), Disp: 12 tablet, Rfl: 2 .  aspirin EC 81 MG tablet, Take 1 tablet (81 mg total) by mouth daily., Disp: 90 tablet, Rfl: 3 .  BREO ELLIPTA 100-25 MCG/INH AEPB, USE 1 INHALATION DAILY, Disp: 60 each, Rfl: 0 .  busPIRone (BUSPAR) 5 MG tablet, Take 1 tablet (5 mg total) by mouth 3 (three) times daily., Disp: 90 tablet, Rfl: 0 .  calcium carbonate (OS-CAL) 600 MG TABS tablet, Take 600 mg by mouth daily  with breakfast., Disp: , Rfl:  .  cephALEXin (KEFLEX) 500 MG capsule, Take 1 capsule by mouth 3 (three) times daily. X 1 week - then 1 nightly (dr Jeffie Pollock), Disp: , Rfl:  .  cholecalciferol  (VITAMIN D) 1000 UNITS tablet, Take 1,000 Units by mouth daily., Disp: , Rfl:  .  doxycycline (VIBRAMYCIN) 100 MG capsule, Take 1 capsule (100 mg total) by mouth 2 (two) times daily., Disp: 20 capsule, Rfl: 0 .  famotidine (PEPCID) 20 MG tablet, Take 20 mg by mouth daily., Disp: , Rfl:  .  fluticasone (FLONASE) 50 MCG/ACT nasal spray, USE 2 SPRAYS IN EACH NOSTRIL DAILY, Disp: 16 g, Rfl: 11 .  HYDROcodone-homatropine (HYDROMET) 5-1.5 MG/5ML syrup, Take 5 mLs by mouth every 6 (six) hours as needed for cough., Disp: , Rfl:  .  ibuprofen (ADVIL,MOTRIN) 200 MG tablet, Take 200 mg by mouth every 6 (six) hours as needed., Disp: , Rfl:  .  megestrol (MEGACE) 40 MG tablet, TAKE 1/2 TABLET TWICE DAILY, Disp: 90 tablet, Rfl: 0 .  Multiple Vitamins-Minerals (CENTRUM SILVER) tablet, Take 1 tablet by mouth every evening., Disp: , Rfl:  .  simvastatin (ZOCOR) 40 MG tablet, TAKE ONE (1) TABLET EACH DAY, Disp: 90 tablet, Rfl: 1 .  nitroGLYCERIN (NITROSTAT) 0.4 MG SL tablet, Place 1 tablet (0.4 mg total) under the tongue every 5 (five) minutes as needed for chest pain. (Patient not taking: Reported on 01/22/2018), Disp: 25 tablet, Rfl: 3  Current Facility-Administered Medications:  .  cyanocobalamin ((VITAMIN B-12)) injection 1,000 mcg, 1,000 mcg, Intramuscular, Q30 days, Chipper Herb, MD, 1,000 mcg at 01/15/18 1500      Objective:   Vitals:   01/22/18 1334  BP: 118/64  Pulse: 76  SpO2: 98%  Weight: 100 lb 6.4 oz (45.5 kg)  Height: 4\' 9"  (1.448 m)    Estimated body mass index is 21.73 kg/m as calculated from the following:   Height as of this encounter: 4\' 9"  (1.448 m).   Weight as of this encounter: 100 lb 6.4 oz (45.5 kg).  @WEIGHTCHANGE @  [employer]   01/22/18 1334  Weight: 100 lb 6.4 oz (45.5 kg)     Physical Exam  General Appearance:    Alert, cooperative, no distress, appears stated age - yes , Deconditioned looking - yes , OBESE  - no, instead she is frail, Sitting on Wheelchair -   no  Head:    Normocephalic, without obvious abnormality, atraumatic  Eyes:    PERRL, conjunctiva/corneas clear,  Ears:    Normal TM's and external ear canals, both ears  Nose:   Nares normal, septum midline, mucosa normal, no drainage    or sinus tenderness. OXYGEN ON  - no . Patient is @ ra   Throat:   Lips, mucosa, and tongue normal; teeth and gums normal. Cyanosis on lips - no  Neck:   Supple, symmetrical, trachea midline, no adenopathy;    thyroid:  no enlargement/tenderness/nodules; no carotid   bruit or JVD  Back:     Symmetric, no curvature, ROM normal, no CVA tenderness  Lungs:     Distress - no , Wheeze no, Barrell Chest - no, Purse lip breathing - no, Crackles - faint scatted   Chest Wall:    No tenderness or deformity.    Heart:    Regular rate and rhythm, S1 and S2 normal, no rub   or gallop, Murmur - no  Breast Exam:    NOT DONE  Abdomen:  Soft, non-tender, bowel sounds active all four quadrants,    no masses, no organomegaly. Visceral obesity - no  Genitalia:   NOT DONE  Rectal:   NOT DONE  Extremities:   Extremities - normal, Has Cane - no, Clubbing - no, Edema - no  Pulses:   2+ and symmetric all extremities  Skin:   Stigmata of Connective Tissue Disease - no  Lymph nodes:   Cervical, supraclavicular, and axillary nodes normal  Psychiatric:  Neurologic:   Pleasant - yes, Anxious - no, Flat affect - no  CAm-ICU - neg, Alert and Oriented x 3 - yes, Moves all 4s - yes, Speech - normal, Cognition - intact           Assessment:       ICD-10-CM   1. Acute bronchitis, unspecified organism J20.9   2. IPF (idiopathic pulmonary fibrosis) ([place]) J84.112   3. Depression, unspecified depression type F32.9        Plan:     Patient Instructions  Acute bronchitis, unspecified organism  Please take prednisone 40 mg x1 day, then 30 mg x1 day, then 20 mg x1 day, then 10 mg x1 day, and then 5 mg x1 day and stop   IPF (idiopathic pulmonary fibrosis) ([place]) -  stable   Depression, unspecified depression type - per pcp Chipper Herb, MD but can try light therapy daily - buy from [email or web address]  Followup  6 months or sooner if needed     SIGNATURE    Dr. Brand Males, M.D., F.C.C.P,  Pulmonary and Critical Care Medicine Staff Physician, [place] Director - Interstitial Lung Disease  Program  Pulmonary [place] at [place], [name], [address]  Pager: [phone], If no answer or between  15:00h - 7:00h: call [phone] Telephone: [phone]  2:15 PM 01/22/2018

## 2018-01-22 NOTE — Patient Instructions (Signed)
Acute bronchitis, unspecified organism  Please take prednisone 40 mg x1 day, then 30 mg x1 day, then 20 mg x1 day, then 10 mg x1 day, and then 5 mg x1 day and stop   IPF (idiopathic pulmonary fibrosis) ([place]) - stable   Depression, unspecified depression type - per pcp Chipper Herb, MD but can try light therapy daily - buy from [email or web address]  Followup  6 months or sooner if needed

## 2018-01-22 NOTE — Addendum Note (Signed)
Addended by: Lorretta Harp on: 01/22/2018 02:18 PM   Modules accepted: Orders

## 2018-02-10 ENCOUNTER — Other Ambulatory Visit: Payer: Self-pay | Admitting: Family Medicine

## 2018-02-10 DIAGNOSIS — F410 Panic disorder [episodic paroxysmal anxiety] without agoraphobia: Secondary | ICD-10-CM

## 2018-02-10 DIAGNOSIS — F419 Anxiety disorder, unspecified: Secondary | ICD-10-CM

## 2018-02-10 DIAGNOSIS — Z09 Encounter for follow-up examination after completed treatment for conditions other than malignant neoplasm: Secondary | ICD-10-CM

## 2018-02-11 ENCOUNTER — Ambulatory Visit (INDEPENDENT_AMBULATORY_CARE_PROVIDER_SITE_OTHER): Payer: Medicare Other | Admitting: Family Medicine

## 2018-02-11 ENCOUNTER — Encounter: Payer: Self-pay | Admitting: Family Medicine

## 2018-02-11 VITALS — BP 136/67 | HR 76 | Temp 97.4°F | Ht <= 58 in | Wt 101.0 lb

## 2018-02-11 DIAGNOSIS — F419 Anxiety disorder, unspecified: Secondary | ICD-10-CM

## 2018-02-11 DIAGNOSIS — J84112 Idiopathic pulmonary fibrosis: Secondary | ICD-10-CM

## 2018-02-11 DIAGNOSIS — M48061 Spinal stenosis, lumbar region without neurogenic claudication: Secondary | ICD-10-CM

## 2018-02-11 DIAGNOSIS — E559 Vitamin D deficiency, unspecified: Secondary | ICD-10-CM

## 2018-02-11 DIAGNOSIS — E7800 Pure hypercholesterolemia, unspecified: Secondary | ICD-10-CM

## 2018-02-11 DIAGNOSIS — E538 Deficiency of other specified B group vitamins: Secondary | ICD-10-CM

## 2018-02-11 DIAGNOSIS — R109 Unspecified abdominal pain: Secondary | ICD-10-CM

## 2018-02-11 LAB — URINALYSIS, COMPLETE
Bilirubin, UA: NEGATIVE
Glucose, UA: NEGATIVE
Ketones, UA: NEGATIVE
Nitrite, UA: NEGATIVE
Protein, UA: NEGATIVE
RBC, UA: NEGATIVE
Specific Gravity, UA: 1.01 (ref 1.005–1.030)
Urobilinogen, Ur: 0.2 mg/dL (ref 0.2–1.0)
pH, UA: 6 (ref 5.0–7.5)

## 2018-02-11 LAB — MICROSCOPIC EXAMINATION

## 2018-02-11 NOTE — Progress Notes (Signed)
Subjective:    Patient ID: [name], female    DOB: [birth year], 85 y.o.   MRN: [id number]  HPI Pt here for follow up and management of chronic medical problems which includes hyperlipidemia. She is taking medication regularly.  This patient has osteoarthritis of the spine and lumbar stenosis.  She has vitamin D deficiency chronic back pain and idiopathic pulmonary fibrosis.  She is followed regularly by Dr. Chase Caller, her pulmonologist.  Occasionally seen Dr. Jeffie Pollock urology and Dr. Christella Noa neurosurgery and Dr. Domenic Polite cardiology.  Patient just saw the pulmonologist recently.  He was pleased with everything and told her to come back and see him again in about 6 months.  She is using a nebulizer with albuterol at nighttime and using her Brio inhaler once daily.  She is somewhat tearful today because 1 of her long-term friends passed away recently and she was older than [place].  The patient today seems to be alert she seems to be a little short winded when she is talking.  Her breathing is good and bad at times and she has to stop and rest when she is moving around the house.  Using the albuterol nebulizer does seem to help along with the Brio inhaler during the day.  She says her bowels are moving well and she is passing her water okay but just having some discomfort in her abdomen.  She does not have any chest pain.  She is alert.  She is taking some BuSpar for anxiety.  We have encouraged her to take Tylenol if needed for pain instead of Advil.    Patient Active Problem List   Diagnosis Date Noted  . Atypical chest pain 03/24/2017  . Weight loss 03/24/2017  . IPF (idiopathic pulmonary fibrosis) ([place]) 08/09/2016  . Interstitial lung disease ([place]) 07/07/2015  . Nodule of right lung 07/07/2015  . Chronic cystitis 01/08/2013  . Vitamin D deficiency 12/31/2012  . Osteoporosis 12/31/2012  . Spinal stenosis of lumbar region 12/31/2012  . Hyperlipidemia 07/16/2012  . Chronic low back pain  07/16/2012  . Vitamin B12 deficient megaloblastic anemia 07/16/2012   Outpatient Encounter Medications as of 02/11/2018  Medication Sig  . albuterol (PROVENTIL) (2.5 MG/3ML) 0.083% nebulizer solution Take 3 mLs (2.5 mg total) by nebulization every 6 (six) hours as needed for wheezing or shortness of breath.  Marland Kitchen albuterol (VENTOLIN HFA) 108 (90 Base) MCG/ACT inhaler 2 puffs every 4-6 hours as needed for wheezing, shortness of breath  . alendronate (FOSAMAX) 70 MG tablet TAKE 1 TABLET WEEKLY (TAKE WITH 8OZ OF WATER 30 MINUTES BEFORE BREAKFAST)  . aspirin EC 81 MG tablet Take 1 tablet (81 mg total) by mouth daily.  Marland Kitchen BREO ELLIPTA 100-25 MCG/INH AEPB USE 1 INHALATION DAILY  . busPIRone (BUSPAR) 5 MG tablet TAKE ONE (1) TABLET THREE (3) TIMES EACH DAY  . calcium carbonate (OS-CAL) 600 MG TABS tablet Take 600 mg by mouth daily with breakfast.  . cephALEXin (KEFLEX) 500 MG capsule Take 1 capsule by mouth 3 (three) times daily. X 1 week - then 1 nightly (dr Jeffie Pollock)  . cholecalciferol (VITAMIN D) 1000 UNITS tablet Take 1,000 Units by mouth daily.  . famotidine (PEPCID) 20 MG tablet Take 20 mg by mouth daily.  . fluticasone (FLONASE) 50 MCG/ACT nasal spray USE 2 SPRAYS IN EACH NOSTRIL DAILY  . ibuprofen (ADVIL,MOTRIN) 200 MG tablet Take 200 mg by mouth every 6 (six) hours as needed.  . megestrol (MEGACE) 40 MG tablet TAKE 1/2 TABLET TWICE  DAILY  . Multiple Vitamins-Minerals (CENTRUM SILVER) tablet Take 1 tablet by mouth every evening.  . nitroGLYCERIN (NITROSTAT) 0.4 MG SL tablet Place 1 tablet (0.4 mg total) under the tongue every 5 (five) minutes as needed for chest pain.  . simvastatin (ZOCOR) 40 MG tablet TAKE ONE (1) TABLET EACH DAY  . [DISCONTINUED] doxycycline (VIBRAMYCIN) 100 MG capsule Take 1 capsule (100 mg total) by mouth 2 (two) times daily.  . [DISCONTINUED] HYDROcodone-homatropine (HYDROMET) 5-1.5 MG/5ML syrup Take 5 mLs by mouth every 6 (six) hours as needed for cough.  . [DISCONTINUED]  predniSONE (DELTASONE) 10 MG tablet Take 4tabsx1day,3tabsx1day,2tabsx2day,1tabx1day,0.5tabx1day, then stop   Facility-Administered Encounter Medications as of 02/11/2018  Medication  . cyanocobalamin ((VITAMIN B-12)) injection 1,000 mcg     Review of Systems  Constitutional: Negative.   HENT: Negative.   Eyes: Negative.   Respiratory: Negative.   Cardiovascular: Negative.   Gastrointestinal: Positive for abdominal pain (x 2 days ).  Endocrine: Negative.   Genitourinary: Negative.   Musculoskeletal: Positive for back pain (low x 2 days).  Skin: Negative.   Allergic/Immunologic: Negative.   Neurological: Negative.   Hematological: Negative.   Psychiatric/Behavioral: The patient is nervous/anxious.        Objective:   Physical Exam Vitals signs and nursing note reviewed.  Constitutional:      Appearance: She is well-developed. She is not ill-appearing or diaphoretic.     Comments: Patient is pleasant and very alert even though she is somewhat short winded and elderly.Marland Kitchen  HENT:     Head: Normocephalic and atraumatic.     Right Ear: Tympanic membrane, ear canal and external ear normal. There is no impacted cerumen.     Left Ear: Tympanic membrane, ear canal and external ear normal. There is no impacted cerumen.     Ears:     Comments: Normal cerumen bilaterally    Nose: Nose normal. No congestion.     Mouth/Throat:     Mouth: Mucous membranes are moist.     Pharynx: Oropharynx is clear. No oropharyngeal exudate or posterior oropharyngeal erythema.  Eyes:     General: No scleral icterus.       Right eye: No discharge.        Left eye: No discharge.     Extraocular Movements: Extraocular movements intact.     Conjunctiva/sclera: Conjunctivae normal.     Pupils: Pupils are equal, round, and reactive to light.  Neck:     Musculoskeletal: Normal range of motion and neck supple.     Thyroid: No thyromegaly.     Vascular: No carotid bruit or JVD.  Cardiovascular:     Rate and  Rhythm: Normal rate and regular rhythm.     Heart sounds: Normal heart sounds. No murmur. No friction rub.     Comments: Heart is regular at 72/min Pulmonary:     Effort: Pulmonary effort is normal. No respiratory distress.     Breath sounds: Wheezing and rales present.     Comments: Diminished breath sounds but minimal rales and wheezes. Abdominal:     General: Abdomen is flat. Bowel sounds are normal.     Palpations: Abdomen is soft. There is no mass.     Tenderness: There is no abdominal tenderness.  Musculoskeletal:        General: Deformity present. No tenderness.     Right lower leg: No edema.     Left lower leg: No edema.     Comments: Range of motion was hesitant because  of her kyphotic posture in her back pain.  Lymphadenopathy:     Cervical: No cervical adenopathy.  Skin:    General: Skin is warm and dry.     Findings: No rash.  Neurological:     General: No focal deficit present.     Mental Status: She is alert and oriented to person, place, and time. Mental status is at baseline.     Cranial Nerves: No cranial nerve deficit.     Coordination: Coordination normal.     Deep Tendon Reflexes: Reflexes are normal and symmetric.  Psychiatric:        Mood and Affect: Mood normal.        Behavior: Behavior normal.        Thought Content: Thought content normal.        Judgment: Judgment normal.     Comments: Patient was somewhat tearful about discussing the death of 1 of her elderly friends Mrs. Guerry Bruin.  After discussing this with her and also with her granddaughter we will up her BuSpar and let her take 10 mg 3 times a day.    BP 136/67 (BP Location: Left Arm)   Pulse 76   Temp (!) 97.4 F (36.3 C) (Oral)   Ht 4' 9" (1.448 m)   Wt 101 lb (45.8 kg)   BMI 21.86 kg/m         Assessment & Plan:  1. Pure hypercholesterolemia - BMP8+EGFR - CBC with Differential/Platelet - Lipid panel - Hepatic function panel  2. Vitamin D deficiency -Continue with vitamin D  replacement pending results of lab work - CBC with Differential/Platelet - VITAMIN D 25 Hydroxy (Vit-D Deficiency, Fractures)  3. B12 deficiency -Continue with B12 replacement - CBC with Differential/Platelet  4. Abdominal discomfort -Patient does not describe any problems with passing her water and she says she drinks plenty of fluids.  Some of the abdominal pain may truly be coming from her back. - Urine Culture - Urinalysis, Complete  5. Anxiety -Increase BuSpar to 10 mg 2-3 times daily if needed and if she needs more we can increase this again.  6. Spinal stenosis of lumbar region without neurogenic claudication -Follow-up with Dr. Christella Noa  7. Idiopathic pulmonary fibrosis ([place]) -Follow-up with pulmonologist  No orders of the defined types were placed in this encounter.  Patient Instructions                       Medicare Annual Wellness Visit  [facility] and the medical providers at [facility] strive to bring you the best medical care.  In doing so we not only want to address your current medical conditions and concerns but also to detect new conditions early and prevent illness, disease and health-related problems.    Medicare offers a yearly Wellness Visit which allows our clinical staff to assess your need for preventative services including immunizations, lifestyle education, counseling to decrease risk of preventable diseases and screening for fall risk and other medical concerns.    This visit is provided free of charge (no copay) for all Medicare recipients. The clinical pharmacists at [facility] have begun to conduct these Wellness Visits which will also include a thorough review of all your medications.    As you primary medical provider recommend that you make an appointment for your Annual Wellness Visit if you have not done so already this year.  You may set up this appointment before you leave today or  you may call  back ([phone]) and schedule an appointment.  Please make sure when you call that you mention that you are scheduling your Annual Wellness Visit with the clinical pharmacist so that the appointment may be made for the proper length of time.     Continue current medications. Continue good therapeutic lifestyle changes which include good diet and exercise. Fall precautions discussed with patient. If an FOBT was given today- please return it to our front desk. If you are over 37 years old - you may need Prevnar 15 or the adult Pneumonia vaccine.  **Flu shots are available--- please call and schedule a FLU-CLINIC appointment**  After your visit with [place] today you will receive a survey in the mail or online from [employer] regarding your care with [place]. Please take a moment to fill this out. Your feedback is very important to [place] as you can help [place] better understand your patient needs as well as improve your experience and satisfaction. WE CARE ABOUT YOU!!!   Continue to practice good pulmonary and hand hygiene Keep the house as cool as possible Drink plenty of fluids Call Dr. Christella Noa if the pain in your back does not get better that comes around to the front Continue to be careful and do not put yourself at risk for falling Continue with nebulizer treatment and inhaler Follow-up with pulmonologist as planned We will call with lab work as soon as it becomes available  Arrie Senate MD

## 2018-02-11 NOTE — Patient Instructions (Addendum)
Medicare Annual Wellness Visit  [facility] and the medical providers at [facility] strive to bring you the best medical care.  In doing so we not only want to address your current medical conditions and concerns but also to detect new conditions early and prevent illness, disease and health-related problems.    Medicare offers a yearly Wellness Visit which allows our clinical staff to assess your need for preventative services including immunizations, lifestyle education, counseling to decrease risk of preventable diseases and screening for fall risk and other medical concerns.    This visit is provided free of charge (no copay) for all Medicare recipients. The clinical pharmacists at [facility] have begun to conduct these Wellness Visits which will also include a thorough review of all your medications.    As you primary medical provider recommend that you make an appointment for your Annual Wellness Visit if you have not done so already this year.  You may set up this appointment before you leave today or you may call back ([phone]) and schedule an appointment.  Please make sure when you call that you mention that you are scheduling your Annual Wellness Visit with the clinical pharmacist so that the appointment may be made for the proper length of time.     Continue current medications. Continue good therapeutic lifestyle changes which include good diet and exercise. Fall precautions discussed with patient. If an FOBT was given today- please return it to our front desk. If you are over 39 years old - you may need Prevnar 27 or the adult Pneumonia vaccine.  **Flu shots are available--- please call and schedule a FLU-CLINIC appointment**  After your visit with [place] today you will receive a survey in the mail or online from [employer] regarding your care with [place]. Please take a moment to fill this out. Your feedback is very  important to [place] as you can help [place] better understand your patient needs as well as improve your experience and satisfaction. WE CARE ABOUT YOU!!!   Continue to practice good pulmonary and hand hygiene Keep the house as cool as possible Drink plenty of fluids Call Dr. Christella Noa if the pain in your back does not get better that comes around to the front Continue to be careful and do not put yourself at risk for falling Continue with nebulizer treatment and inhaler Follow-up with pulmonologist as planned We will call with lab work as soon as it becomes available

## 2018-02-12 LAB — CBC WITH DIFFERENTIAL/PLATELET
Basophils Absolute: 0 10*3/uL (ref 0.0–0.2)
Basos: 1 %
EOS (ABSOLUTE): 0.2 10*3/uL (ref 0.0–0.4)
Eos: 3 %
Hematocrit: 39.1 % (ref 34.0–46.6)
Hemoglobin: 12.7 g/dL (ref 11.1–15.9)
IMMATURE GRANS (ABS): 0 10*3/uL (ref 0.0–0.1)
Immature Granulocytes: 0 %
LYMPHS: 17 %
Lymphocytes Absolute: 1.1 10*3/uL (ref 0.7–3.1)
MCH: 28.3 pg (ref 26.6–33.0)
MCHC: 32.5 g/dL (ref 31.5–35.7)
MCV: 87 fL (ref 79–97)
MONOCYTES: 12 %
Monocytes Absolute: 0.8 10*3/uL (ref 0.1–0.9)
Neutrophils Absolute: 4.3 10*3/uL (ref 1.4–7.0)
Neutrophils: 67 %
Platelets: 297 10*3/uL (ref 150–450)
RBC: 4.49 x10E6/uL (ref 3.77–5.28)
RDW: 12.8 % (ref 11.7–15.4)
WBC: 6.4 10*3/uL (ref 3.4–10.8)

## 2018-02-12 LAB — BMP8+EGFR
BUN/Creatinine Ratio: 13 (ref 12–28)
BUN: 9 mg/dL — ABNORMAL LOW (ref 10–36)
CO2: 26 mmol/L (ref 20–29)
Calcium: 9.2 mg/dL (ref 8.7–10.3)
Chloride: 101 mmol/L (ref 96–106)
Creatinine, Ser: 0.69 mg/dL (ref 0.57–1.00)
GFR calc Af Amer: 89 mL/min/{1.73_m2} (ref >59)
GFR, EST NON AFRICAN AMERICAN: 77 mL/min/{1.73_m2} (ref >59)
Glucose: 71 mg/dL (ref 65–99)
Potassium: 3.9 mmol/L (ref 3.5–5.2)
Sodium: 144 mmol/L (ref 134–144)

## 2018-02-12 LAB — LIPID PANEL
CHOLESTEROL TOTAL: 120 mg/dL (ref 100–199)
Chol/HDL Ratio: 1.8 ratio (ref 0.0–4.4)
HDL: 65 mg/dL (ref >39)
LDL CALC: 44 mg/dL (ref 0–99)
Triglycerides: 53 mg/dL (ref 0–149)
VLDL Cholesterol Cal: 11 mg/dL (ref 5–40)

## 2018-02-12 LAB — HEPATIC FUNCTION PANEL
ALT: 14 IU/L (ref 0–32)
AST: 24 IU/L (ref 0–40)
Albumin: 4.1 g/dL (ref 3.5–4.6)
Alkaline Phosphatase: 71 IU/L (ref 39–117)
Bilirubin Total: 0.2 mg/dL (ref 0.0–1.2)
Bilirubin, Direct: 0.1 mg/dL (ref 0.00–0.40)
Total Protein: 6.5 g/dL (ref 6.0–8.5)

## 2018-02-12 LAB — VITAMIN D 25 HYDROXY (VIT D DEFICIENCY, FRACTURES): Vit D, 25-Hydroxy: 38.3 ng/mL (ref 30.0–100.0)

## 2018-02-13 LAB — URINE CULTURE: Organism ID, Bacteria: NO GROWTH

## 2018-02-17 ENCOUNTER — Other Ambulatory Visit: Payer: Self-pay | Admitting: Family

## 2018-02-17 DIAGNOSIS — J849 Interstitial pulmonary disease, unspecified: Secondary | ICD-10-CM

## 2018-02-18 ENCOUNTER — Ambulatory Visit (INDEPENDENT_AMBULATORY_CARE_PROVIDER_SITE_OTHER): Payer: Medicare Other | Admitting: *Deleted

## 2018-02-18 ENCOUNTER — Other Ambulatory Visit: Payer: Self-pay | Admitting: *Deleted

## 2018-02-18 DIAGNOSIS — E538 Deficiency of other specified B group vitamins: Secondary | ICD-10-CM

## 2018-02-18 DIAGNOSIS — F419 Anxiety disorder, unspecified: Secondary | ICD-10-CM

## 2018-02-18 DIAGNOSIS — F410 Panic disorder [episodic paroxysmal anxiety] without agoraphobia: Secondary | ICD-10-CM

## 2018-02-18 DIAGNOSIS — Z09 Encounter for follow-up examination after completed treatment for conditions other than malignant neoplasm: Secondary | ICD-10-CM

## 2018-02-18 MED ORDER — BUSPIRONE HCL 10 MG PO TABS: 10.0000 mg | ORAL_TABLET | Freq: Three times a day (TID) | ORAL | 1 refills | Status: DC

## 2018-02-18 NOTE — Progress Notes (Signed)
Pt given Cyanocobalamin inj Tolerated well 

## 2018-02-24 DIAGNOSIS — L570 Actinic keratosis: Secondary | ICD-10-CM | POA: Diagnosis not present

## 2018-02-24 DIAGNOSIS — Z85828 Personal history of other malignant neoplasm of skin: Secondary | ICD-10-CM | POA: Diagnosis not present

## 2018-02-24 DIAGNOSIS — L219 Seborrheic dermatitis, unspecified: Secondary | ICD-10-CM | POA: Diagnosis not present

## 2018-03-18 ENCOUNTER — Ambulatory Visit (INDEPENDENT_AMBULATORY_CARE_PROVIDER_SITE_OTHER): Payer: Medicare Other | Admitting: *Deleted

## 2018-03-18 DIAGNOSIS — E538 Deficiency of other specified B group vitamins: Secondary | ICD-10-CM | POA: Diagnosis not present

## 2018-03-18 NOTE — Progress Notes (Signed)
Pt given Cyanocobalamin inj Tolerated well 

## 2018-03-20 ENCOUNTER — Observation Stay (HOSPITAL_COMMUNITY)
Admission: AD | Admit: 2018-03-20 | Discharge: 2018-03-21 | Disposition: A | Discharge status: Home / Self Care | Payer: Medicare Other | Source: Other Acute Inpatient Hospital | Attending: Internal Medicine | Admitting: Internal Medicine

## 2018-03-20 DIAGNOSIS — Z6823 Body mass index (BMI) 23.0-23.9, adult: Secondary | ICD-10-CM | POA: Insufficient documentation

## 2018-03-20 DIAGNOSIS — Z881 Allergy status to other antibiotic agents status: Secondary | ICD-10-CM | POA: Diagnosis not present

## 2018-03-20 DIAGNOSIS — J441 Chronic obstructive pulmonary disease with (acute) exacerbation: Secondary | ICD-10-CM | POA: Diagnosis not present

## 2018-03-20 DIAGNOSIS — R410 Disorientation, unspecified: Secondary | ICD-10-CM | POA: Insufficient documentation

## 2018-03-20 DIAGNOSIS — E785 Hyperlipidemia, unspecified: Secondary | ICD-10-CM | POA: Diagnosis not present

## 2018-03-20 DIAGNOSIS — Z7982 Long term (current) use of aspirin: Secondary | ICD-10-CM | POA: Insufficient documentation

## 2018-03-20 DIAGNOSIS — R636 Underweight: Secondary | ICD-10-CM | POA: Diagnosis not present

## 2018-03-20 DIAGNOSIS — Z79899 Other long term (current) drug therapy: Secondary | ICD-10-CM | POA: Insufficient documentation

## 2018-03-20 DIAGNOSIS — M549 Dorsalgia, unspecified: Secondary | ICD-10-CM | POA: Insufficient documentation

## 2018-03-20 DIAGNOSIS — D649 Anemia, unspecified: Secondary | ICD-10-CM | POA: Diagnosis not present

## 2018-03-20 DIAGNOSIS — J84112 Idiopathic pulmonary fibrosis: Secondary | ICD-10-CM | POA: Diagnosis present

## 2018-03-20 DIAGNOSIS — H409 Unspecified glaucoma: Secondary | ICD-10-CM | POA: Insufficient documentation

## 2018-03-20 DIAGNOSIS — J939 Pneumothorax, unspecified: Secondary | ICD-10-CM | POA: Diagnosis present

## 2018-03-20 DIAGNOSIS — E559 Vitamin D deficiency, unspecified: Secondary | ICD-10-CM | POA: Diagnosis not present

## 2018-03-20 DIAGNOSIS — R05 Cough: Secondary | ICD-10-CM | POA: Insufficient documentation

## 2018-03-20 DIAGNOSIS — Z880 Allergy status to penicillin: Secondary | ICD-10-CM | POA: Insufficient documentation

## 2018-03-20 DIAGNOSIS — G629 Polyneuropathy, unspecified: Secondary | ICD-10-CM | POA: Diagnosis not present

## 2018-03-20 DIAGNOSIS — J9383 Other pneumothorax: Secondary | ICD-10-CM | POA: Diagnosis not present

## 2018-03-20 DIAGNOSIS — Z888 Allergy status to other drugs, medicaments and biological substances status: Secondary | ICD-10-CM | POA: Insufficient documentation

## 2018-03-20 DIAGNOSIS — M48061 Spinal stenosis, lumbar region without neurogenic claudication: Secondary | ICD-10-CM | POA: Diagnosis not present

## 2018-03-20 DIAGNOSIS — E7800 Pure hypercholesterolemia, unspecified: Secondary | ICD-10-CM | POA: Diagnosis not present

## 2018-03-20 DIAGNOSIS — G8929 Other chronic pain: Secondary | ICD-10-CM | POA: Insufficient documentation

## 2018-03-20 DIAGNOSIS — N3020 Other chronic cystitis without hematuria: Secondary | ICD-10-CM | POA: Diagnosis not present

## 2018-03-20 DIAGNOSIS — J209 Acute bronchitis, unspecified: Secondary | ICD-10-CM | POA: Diagnosis not present

## 2018-03-20 DIAGNOSIS — M810 Age-related osteoporosis without current pathological fracture: Secondary | ICD-10-CM | POA: Insufficient documentation

## 2018-03-20 DIAGNOSIS — Z791 Long term (current) use of non-steroidal anti-inflammatories (NSAID): Secondary | ICD-10-CM | POA: Diagnosis not present

## 2018-03-20 DIAGNOSIS — J440 Chronic obstructive pulmonary disease with acute lower respiratory infection: Secondary | ICD-10-CM | POA: Diagnosis not present

## 2018-03-21 ENCOUNTER — Inpatient Hospital Stay (HOSPITAL_COMMUNITY): Payer: Medicare Other

## 2018-03-21 ENCOUNTER — Encounter (HOSPITAL_COMMUNITY): Payer: Self-pay | Admitting: Internal Medicine

## 2018-03-21 ENCOUNTER — Other Ambulatory Visit: Payer: Self-pay

## 2018-03-21 DIAGNOSIS — J939 Pneumothorax, unspecified: Secondary | ICD-10-CM | POA: Diagnosis present

## 2018-03-21 DIAGNOSIS — M48061 Spinal stenosis, lumbar region without neurogenic claudication: Secondary | ICD-10-CM | POA: Diagnosis not present

## 2018-03-21 DIAGNOSIS — J84112 Idiopathic pulmonary fibrosis: Secondary | ICD-10-CM

## 2018-03-21 DIAGNOSIS — N3020 Other chronic cystitis without hematuria: Secondary | ICD-10-CM | POA: Diagnosis not present

## 2018-03-21 DIAGNOSIS — J9383 Other pneumothorax: Secondary | ICD-10-CM | POA: Diagnosis present

## 2018-03-21 DIAGNOSIS — G629 Polyneuropathy, unspecified: Secondary | ICD-10-CM | POA: Diagnosis not present

## 2018-03-21 DIAGNOSIS — E785 Hyperlipidemia, unspecified: Secondary | ICD-10-CM | POA: Diagnosis not present

## 2018-03-21 DIAGNOSIS — G8929 Other chronic pain: Secondary | ICD-10-CM | POA: Diagnosis not present

## 2018-03-21 DIAGNOSIS — J9311 Primary spontaneous pneumothorax: Secondary | ICD-10-CM | POA: Diagnosis not present

## 2018-03-21 LAB — CBC
HCT: 35 % — ABNORMAL LOW (ref 36.0–46.0)
Hemoglobin: 11.3 g/dL — ABNORMAL LOW (ref 12.0–15.0)
MCH: 27.8 pg (ref 26.0–34.0)
MCHC: 32.3 g/dL (ref 30.0–36.0)
MCV: 86.2 fL (ref 80.0–100.0)
PLATELETS: 227 10*3/uL (ref 150–400)
RBC: 4.06 MIL/uL (ref 3.87–5.11)
RDW: 13.2 % (ref 11.5–15.5)
WBC: 4.7 10*3/uL (ref 4.0–10.5)
nRBC: 0 % (ref 0.0–0.2)

## 2018-03-21 LAB — BASIC METABOLIC PANEL
ANION GAP: 8 (ref 5–15)
BUN: 14 mg/dL (ref 8–23)
CO2: 26 mmol/L (ref 22–32)
Calcium: 8.8 mg/dL — ABNORMAL LOW (ref 8.9–10.3)
Chloride: 105 mmol/L (ref 98–111)
Creatinine, Ser: 0.72 mg/dL (ref 0.44–1.00)
GFR calc Af Amer: >60 mL/min (ref >60)
GFR calc non Af Amer: >60 mL/min (ref >60)
Glucose, Bld: 129 mg/dL — ABNORMAL HIGH (ref 70–99)
Potassium: 3.6 mmol/L (ref 3.5–5.1)
Sodium: 139 mmol/L (ref 135–145)

## 2018-03-21 LAB — COMPREHENSIVE METABOLIC PANEL
ALT: UNDETERMINED U/L (ref 0–44)
AST: 31 U/L (ref 15–41)
Albumin: 3 g/dL — ABNORMAL LOW (ref 3.5–5.0)
Alkaline Phosphatase: 53 U/L (ref 38–126)
Anion gap: 7 (ref 5–15)
BUN: 13 mg/dL (ref 8–23)
CHLORIDE: 106 mmol/L (ref 98–111)
CO2: 23 mmol/L (ref 22–32)
Calcium: 8.6 mg/dL — ABNORMAL LOW (ref 8.9–10.3)
Creatinine, Ser: 0.74 mg/dL (ref 0.44–1.00)
GFR calc Af Amer: >60 mL/min (ref >60)
GFR calc non Af Amer: >60 mL/min (ref >60)
Glucose, Bld: 187 mg/dL — ABNORMAL HIGH (ref 70–99)
Potassium: 3.8 mmol/L (ref 3.5–5.1)
Sodium: 136 mmol/L (ref 135–145)
Total Bilirubin: UNDETERMINED mg/dL (ref 0.3–1.2)
Total Protein: 5.9 g/dL — ABNORMAL LOW (ref 6.5–8.1)

## 2018-03-21 LAB — CBC WITH DIFFERENTIAL/PLATELET
ABS IMMATURE GRANULOCYTES: 0.02 10*3/uL (ref 0.00–0.07)
Basophils Absolute: 0 10*3/uL (ref 0.0–0.1)
Basophils Relative: 0 %
Eosinophils Absolute: 0 10*3/uL (ref 0.0–0.5)
Eosinophils Relative: 0 %
HCT: 35.2 % — ABNORMAL LOW (ref 36.0–46.0)
HEMOGLOBIN: 11.5 g/dL — AB (ref 12.0–15.0)
Immature Granulocytes: 1 %
LYMPHS PCT: 7 %
Lymphs Abs: 0.2 10*3/uL — ABNORMAL LOW (ref 0.7–4.0)
MCH: 28 pg (ref 26.0–34.0)
MCHC: 32.7 g/dL (ref 30.0–36.0)
MCV: 85.6 fL (ref 80.0–100.0)
Monocytes Absolute: 0.1 10*3/uL (ref 0.1–1.0)
Monocytes Relative: 4 %
Neutro Abs: 3.1 10*3/uL (ref 1.7–7.7)
Neutrophils Relative %: 88 %
Platelets: 192 10*3/uL (ref 150–400)
RBC: 4.11 MIL/uL (ref 3.87–5.11)
RDW: 13.2 % (ref 11.5–15.5)
WBC: 3.5 10*3/uL — ABNORMAL LOW (ref 4.0–10.5)
nRBC: 0 % (ref 0.0–0.2)

## 2018-03-21 LAB — TROPONIN I: Troponin I: 0.04 ng/mL (ref <0.03)

## 2018-03-21 LAB — INFLUENZA PANEL BY PCR (TYPE A & B)
INFLBPCR: NEGATIVE
Influenza A By PCR: NEGATIVE

## 2018-03-21 LAB — BRAIN NATRIURETIC PEPTIDE: B Natriuretic Peptide: 575.3 pg/mL — ABNORMAL HIGH (ref 0.0–100.0)

## 2018-03-21 MED ORDER — FLUTICASONE FUROATE-VILANTEROL 100-25 MCG/INH IN AEPB
1.0000 | INHALATION_SPRAY | Freq: Every day | RESPIRATORY_TRACT | Status: DC
  Administered 2018-03-21: 1 via RESPIRATORY_TRACT
  Filled 2018-03-21: qty 28

## 2018-03-21 MED ORDER — BUSPIRONE HCL 10 MG PO TABS
10.0000 mg | ORAL_TABLET | Freq: Three times a day (TID) | ORAL | Status: DC
  Administered 2018-03-21: 10 mg via ORAL
  Filled 2018-03-21 (×2): qty 1

## 2018-03-21 MED ORDER — FAMOTIDINE 20 MG PO TABS
20.0000 mg | ORAL_TABLET | Freq: Every day | ORAL | Status: DC
  Administered 2018-03-21: 20 mg via ORAL
  Filled 2018-03-21: qty 1

## 2018-03-21 MED ORDER — ONDANSETRON HCL 4 MG/2ML IJ SOLN: 4.0000 mg | Freq: Four times a day (QID) | INTRAMUSCULAR | Status: DC | PRN

## 2018-03-21 MED ORDER — ONDANSETRON HCL 4 MG PO TABS: 4.0000 mg | ORAL_TABLET | Freq: Four times a day (QID) | ORAL | Status: DC | PRN

## 2018-03-21 MED ORDER — CEPHALEXIN 500 MG PO CAPS: 500.0000 mg | ORAL_CAPSULE | Freq: Every day | ORAL | Status: DC

## 2018-03-21 MED ORDER — HYDROCODONE-HOMATROPINE 5-1.5 MG/5ML PO SYRP
5.0000 mL | ORAL_SOLUTION | Freq: Four times a day (QID) | ORAL | Status: DC | PRN
  Administered 2018-03-21: 5 mL via ORAL
  Filled 2018-03-21: qty 5

## 2018-03-21 MED ORDER — ACETAMINOPHEN 650 MG RE SUPP: 650.0000 mg | Freq: Four times a day (QID) | RECTAL | Status: DC | PRN

## 2018-03-21 MED ORDER — HYDROCODONE-HOMATROPINE 5-1.5 MG/5ML PO SYRP: 5.0000 mL | ORAL_SOLUTION | Freq: Four times a day (QID) | ORAL | 0 refills | Status: AC | PRN

## 2018-03-21 MED ORDER — FLUTICASONE PROPIONATE 50 MCG/ACT NA SUSP
2.0000 | Freq: Every day | NASAL | Status: DC
  Filled 2018-03-21: qty 16

## 2018-03-21 MED ORDER — ALBUTEROL SULFATE (2.5 MG/3ML) 0.083% IN NEBU: 2.5000 mg | INHALATION_SOLUTION | RESPIRATORY_TRACT | Status: DC | PRN

## 2018-03-21 MED ORDER — SIMVASTATIN 20 MG PO TABS: 40.0000 mg | ORAL_TABLET | Freq: Every day | ORAL | Status: DC

## 2018-03-21 MED ORDER — ACETAMINOPHEN 325 MG PO TABS: 650.0000 mg | ORAL_TABLET | Freq: Four times a day (QID) | ORAL | Status: DC | PRN

## 2018-03-21 MED ORDER — MEGESTROL ACETATE 20 MG PO TABS
20.0000 mg | ORAL_TABLET | Freq: Two times a day (BID) | ORAL | Status: DC
  Administered 2018-03-21: 20 mg via ORAL
  Filled 2018-03-21 (×2): qty 1

## 2018-03-21 NOTE — Care Management CC44 (Signed)
Condition Code 44 Documentation Completed  Patient Details  Name: [name] MRN: [id number] Date of Birth: [birth year]   Condition Code 44 given:  Yes Patient signature on Condition Code 44 notice:  Yes Documentation of 2 MD's agreement:  Yes Code 44 added to claim:  Yes    Sharin Mons, RN 03/21/2018, 5:15 PM

## 2018-03-21 NOTE — H&P (Signed)
History and Physical    [name] HYW:[id number] DOB: 11/21/1925 DOA: 03/20/2018  PCP: Chipper Herb, MD  Patient coming from: Patient was transferred from [facility].  Chief Complaint: Shortness of breath and cough.  HPI: [name] is a 85 y.o. female with history of pulmonary fibrosis with chronic shortness of breath, anemia, LBBB and previous pneumothorax had gone to the ER at [facility] with complaints of worsening cough since yesterday morning.  Has been having chronic shortness of breath which has not changed from previous.  Denies any chest pain.  ED Course: In the ER at [facility] patient's chest x-ray showed tiny right apical pneumothorax.  After discussing with it was decided to transfer patient to [facility] for which pulmonologist was contacted and patient accepted to the [facility].  On my exam patient is not in distress.  Denies any chest pain.  Review of Systems: As per HPI, rest all negative.   Past Medical History:  Diagnosis Date  . Arthritis   . Cataract   . Chronic back pain   . Glaucoma   . Hyperlipidemia   . Idiopathic pulmonary fibrosis ([place])   . Lumbar stenosis   . Osteoarthrosis and allied disorders   . Osteoporosis   . Peripheral neuropathy   . Vitamin D deficiency     Past Surgical History:  Procedure Laterality Date  . ABDOMINAL HYSTERECTOMY    . CATARACT EXTRACTION    . Cataract surgery Bilateral   . Eyelid surgery    . KNEE ARTHROSCOPY    . LUMBAR LAMINECTOMY       reports that she has never smoked. She has never used smokeless tobacco. She reports that she does not drink alcohol or use drugs.  Allergies  Allergen Reactions  . Levaquin [Levaquin Leva-Pak] Shortness Of Breath and Palpitations    Patient cannot remember that she is sensitive to this medication; has never heard of it.  . Mucinex [Guaifenesin Er] Other (See Comments)    Causes anxiety  . Bentyl [Dicyclomine Hcl] Other (See Comments)    shakes  . Erythromycin  Diarrhea  . Penicillins Rash    Family History  Problem Relation Age of Onset  . Bone cancer Mother   . Breast cancer Sister   . Diabetes Brother   . Asthma Daughter   . COPD Son   . Asthma Son     Prior to Admission medications   Medication Sig Start Date End Date Taking? Authorizing Provider  albuterol (PROVENTIL) (2.5 MG/3ML) 0.083% nebulizer solution NEBULIZE 1 VIAL EVERY 6 HOURS AS NEEDED 02/18/18   Chipper Herb, MD  alendronate (FOSAMAX) 70 MG tablet TAKE 1 TABLET WEEKLY (TAKE WITH 8OZ OF WATER 30 MINUTES BEFORE BREAKFAST) 11/03/17   Chipper Herb, MD  aspirin EC 81 MG tablet Take 1 tablet (81 mg total) by mouth daily. 04/10/17   Satira Sark, MD  BREO ELLIPTA 100-25 MCG/INH AEPB USE 1 INHALATION DAILY 02/11/18   Chipper Herb, MD  busPIRone (BUSPAR) 10 MG tablet Take 1 tablet (10 mg total) by mouth 3 (three) times daily. 02/18/18   Chipper Herb, MD  calcium carbonate (OS-CAL) 600 MG TABS tablet Take 600 mg by mouth daily with breakfast.    [provider]  cephALEXin (KEFLEX) 500 MG capsule Take 1 capsule by mouth 3 (three) times daily. X 1 week - then 1 nightly (dr Jeffie Pollock) 04/09/16   [provider]  cholecalciferol (VITAMIN D) 1000 UNITS  tablet Take 1,000 Units by mouth daily.    [provider]  famotidine (PEPCID) 20 MG tablet Take 20 mg by mouth daily.    [provider]  fluticasone Asencion Islam) 50 MCG/ACT nasal spray USE 2 SPRAYS IN EACH NOSTRIL DAILY 06/23/17   Chipper Herb, MD  ibuprofen (ADVIL,MOTRIN) 200 MG tablet Take 200 mg by mouth every 6 (six) hours as needed.    [provider]  megestrol (MEGACE) 40 MG tablet TAKE 1/2 TABLET TWICE DAILY 01/15/18   Chipper Herb, MD  Multiple Vitamins-Minerals (CENTRUM SILVER) tablet Take 1 tablet by mouth every evening.    [provider]  nitroGLYCERIN (NITROSTAT) 0.4 MG SL tablet Place 1 tablet (0.4 mg total) under the tongue every 5 (five) minutes as needed for chest  pain. 04/10/17 10/14/18  Satira Sark, MD  simvastatin (ZOCOR) 40 MG tablet TAKE ONE (1) TABLET EACH DAY 11/03/17   Chipper Herb, MD    Physical Exam: Vitals:   03/20/18 2100 03/20/18 2353 03/21/18 0019  BP: (!) 156/68 137/69   Pulse: 82 77   Resp: 20 16   Temp: 98 F (36.7 C) 98.8 F (37.1 C)   TempSrc: Oral Oral   SpO2: 98%    Weight: 49.1 kg    Height:   4\' 9"  (1.448 m)      Constitutional: Moderately built and nourished. Vitals:   03/20/18 2100 03/20/18 2353 03/21/18 0019  BP: (!) 156/68 137/69   Pulse: 82 77   Resp: 20 16   Temp: 98 F (36.7 C) 98.8 F (37.1 C)   TempSrc: Oral Oral   SpO2: 98%    Weight: 49.1 kg    Height:   4\' 9"  (1.448 m)   Eyes: Anicteric no pallor. ENMT: No discharge from the ears eyes nose or mouth. Neck: No mass felt.  No JVD appreciated. Respiratory: No rhonchi or crepitations. Cardiovascular: S1-S2 heard. Abdomen: Soft nontender bowel sounds present. Musculoskeletal: No edema.  No joint effusion. Skin: Chronic skin changes in the right foot. Neurologic: Alert awake oriented to time place and person.  Moves all extremities. Psychiatric: Appears normal per normal affect.   Labs on Admission: I have personally reviewed following labs and imaging studies  CBC: Recent Labs  Lab 03/20/18 2340  WBC 3.5*  NEUTROABS 3.1  HGB 11.5*  HCT 35.2*  MCV 85.6  PLT 371   Basic Metabolic Panel: No results for input(s): NA, K, CL, CO2, GLUCOSE, BUN, CREATININE, CALCIUM, MG, PHOS in the last 168 hours. GFR: CrCl cannot be calculated (Patient's most recent lab result is older than the maximum 21 days allowed.). Liver Function Tests: No results for input(s): AST, ALT, ALKPHOS, BILITOT, PROT, ALBUMIN in the last 168 hours. No results for input(s): LIPASE, AMYLASE in the last 168 hours. No results for input(s): AMMONIA in the last 168 hours. Coagulation Profile: No results for input(s): INR, PROTIME in the last 168 hours. Cardiac  Enzymes: No results for input(s): CKTOTAL, CKMB, CKMBINDEX, TROPONINI in the last 168 hours. BNP (last 3 results) No results for input(s): PROBNP in the last 8760 hours. HbA1C: No results for input(s): HGBA1C in the last 72 hours. CBG: No results for input(s): GLUCAP in the last 168 hours. Lipid Profile: No results for input(s): CHOL, HDL, LDLCALC, TRIG, CHOLHDL, LDLDIRECT in the last 72 hours. Thyroid Function Tests: No results for input(s): TSH, T4TOTAL, FREET4, T3FREE, THYROIDAB in the last 72 hours. Anemia Panel: No results for input(s): VITAMINB12, FOLATE, FERRITIN,  TIBC, IRON, RETICCTPCT in the last 72 hours. Urine analysis:    Component Value Date/Time   COLORURINE AMBER (A) 04/05/2016 1300   APPEARANCEUR Clear 02/11/2018 1620   LABSPEC 1.023 04/05/2016 1300   PHURINE 5.0 04/05/2016 1300   GLUCOSEU Negative 02/11/2018 1620   HGBUR NEGATIVE 04/05/2016 1300   BILIRUBINUR Negative 02/11/2018 1620   KETONESUR NEGATIVE 04/05/2016 1300   PROTEINUR Negative 02/11/2018 1620   PROTEINUR NEGATIVE 04/05/2016 1300   UROBILINOGEN negative 06/22/2014 1110   UROBILINOGEN 0.2 05/23/2008 0607   NITRITE Negative 02/11/2018 1620   NITRITE POSITIVE (A) 04/05/2016 1300   LEUKOCYTESUR 2+ (A) 02/11/2018 1620   Sepsis Labs: @LABRCNTIP (procalcitonin:4,lacticidven:4) )No results found for this or any previous visit (from the past 240 hour(s)).   Radiological Exams on Admission: Dg Chest 2 View  Result Date: 03/21/2018 CLINICAL DATA:  Shortness of breath.  Follow-up pneumothorax. EXAM: CHEST - 2 VIEW COMPARISON:  Chest radiograph March 20, 2018 and CT chest December 05, 2017. FINDINGS: Stable RIGHT pneumothorax measuring less than 1 cm from the chest wall; ipsilateral mediastinal shift. Diffuse interstitial prominence with nodular densities in airspace opacities RIGHT long. No pleural effusion. Stable cardiomegaly. Mildly calcified aortic arch. Osteopenia. IMPRESSION: 1. Stable small RIGHT  pneumothorax. 2. Chronic interstitial lung disease with RIGHT upper lobe nodular component and scarring/atelectasis. 3.  Aortic Atherosclerosis (ICD10-I70.0). Electronically Signed   By: Elon Alas M.D.   On: 03/21/2018 00:18    EKG: Independently reviewed.  Normal sinus rhythm with LBBB.  Assessment/Plan Principal Problem:   Pneumothorax Active Problems:   Hyperlipidemia   Chronic cystitis   IPF (idiopathic pulmonary fibrosis) ([place])    1. Right apical pneumothorax -presently hemodynamically stable.  Appears to be small.  Has had previous history of pneumothorax.  I have consulted pulmonary critical care for further recommendations.  Continue oxygen and nebulizer therapy. 2. Chronic cystitis on empiric antibiotics presently n.p.o. 3. Chronic LBBB. 4. Chronic anemia follow CBC. 5. Pulmonary fibrosis being followed by pulmonologist.   DVT prophylaxis: SCDs for now until we make sure there is no procedure. Code Status: Full code. Family Communication: Patient's daughter. Disposition Plan: Home. Consults called: Pulmonary critical care. Admission status: Inpatient.   Rise Patience MD [facility] Pager [phone].  If 7PM-7AM, please contact night-coverage [email or web address] Password [facility]  03/21/2018, 12:56 AM

## 2018-03-21 NOTE — Plan of Care (Signed)
Pt ready for discharge

## 2018-03-21 NOTE — Progress Notes (Signed)
SATURATION QUALIFICATIONS: (This note is used to comply with regulatory documentation for home oxygen)  Patient Saturations on Room Air at Rest = 100%  Patient Saturations on Room Air while Ambulating =98*%  Patient Saturations on 0  Liters of oxygen while Ambulating = 98%  Please briefly explain why patient needs home oxygen:doesnt

## 2018-03-21 NOTE — Care Management Obs Status (Signed)
MEDICARE OBSERVATION STATUS NOTIFICATION   Patient Details  Name: [name] MRN: [id number] Date of Birth: Dec 30, 1925   Medicare Observation Status Notification Given:  Yes    Sharin Mons, RN 03/21/2018, 5:15 PM

## 2018-03-21 NOTE — Consult Note (Signed)
Name: [name] MRN: [id number] DOB: 11-20-1925    ADMISSION DATE:  03/20/2018 CONSULTATION DATE:  03/21/18  REFERRING MD :  Dr Hal Hope  CHIEF COMPLAINT:  cough  BRIEF PATIENT DESCRIPTION: 85 year old female with pulmonary fibrosis admitted for tiny apical right pneumothorax  SIGNIFICANT EVENTS  3/7 transferred from [place]:  CXR 3/6 small right pneumothorax   HISTORY OF PRESENT ILLNESS: 85 year old female history of pulmonary fibrosis who presented with 1 day of nonproductive cough to [facility].  Denies fevers, chills, night sweats, nausea, vomiting, myalgias, flulike symptoms or chest pain although was complaining of some pleuritic type left-sided pain at [facility]. there she was found to have a small right apical pneumothorax.  Per the documentation from [facility] Dr. Leory Plowman with hospital medicine was asked to admit the patient however he was not willing to admit the patient without being a DO NOT RESUSCITATE and the family refused.  Therefore the patient was discussed with Dr. Nelda Marseille and transferred here.  Prior to transfer she was treated with a single dose of methylprednisolone 100 mg IV.  In terms of her baseline she states that her breathing has been stable going back many months.  She is able to do household chores such as sweeping and as long as she does not try to do multiple chores and once she does not have trouble with her breathing.  She was treated for an episode of acute bronchitis with antibiotics and a short course of prednisone in the last few months with improvement in her symptoms.  She is previously had small left pneumothoraces which have not required intervention.   SUBJECTIVE:  Resting this am on O2    VITAL SIGNS: Temp:  [97.3 F (36.3 C)-98.8 F (37.1 C)] 97.3 F (36.3 C) (03/07 0756) Pulse Rate:  [72-82] 72 (03/07 0756) Resp:  [16-20] 16 (03/06 2353) BP: (137-156)/(59-69) 143/59 (03/07 0756) SpO2:  [98 %-99 %] 99 % (03/07  0824) Weight:  [49.1 kg] 49.1 kg (03/06 2100)  PHYSICAL EXAMINATION: Physical Exam Constitutional:      Appearance: She is underweight.     Comments: Elderly, frail woman lying in bed in no distress  HENT:     Head: Normocephalic and atraumatic.  Eyes:     General: No scleral icterus.       Right eye: No discharge.        Left eye: No discharge.  Neck:     Vascular: No JVD.  Cardiovascular:     Rate and Rhythm: Normal rate.     Heart sounds: Murmur (Holosystolic heard throughout precordium) present.  Pulmonary:     Effort: No respiratory distress.     Breath sounds: No stridor.     Comments: Scattered crackles worse in the upper lung fields especially the right.  Normal work of breathing, no conversational dyspnea. On room air Abdominal:     General: There is no distension.     Palpations: Abdomen is soft.     Tenderness: There is no abdominal tenderness.  Musculoskeletal:        General: No deformity.     Right lower leg: Edema present.     Left lower leg: Edema present.  Skin:    General: Skin is warm and dry.  Neurological:     Mental Status: She is oriented to person, place, and time.     Cranial Nerves: No cranial nerve deficit.     Comments: Able to provide thorough history though  has trouble remembering specific details from several months ago      Recent Labs  Lab 03/20/18 2340 03/21/18 0304  NA 136 139  K 3.8 3.6  CL 106 105  CO2 23 26  BUN 13 14  CREATININE 0.74 0.72  GLUCOSE 187* 129*   Recent Labs  Lab 03/20/18 2340 03/21/18 0304  HGB 11.5* 11.3*  HCT 35.2* 35.0*  WBC 3.5* 4.7  PLT 192 227   Dg Chest 2 View  Result Date: 03/21/2018 CLINICAL DATA:  Shortness of breath.  Follow-up pneumothorax. EXAM: CHEST - 2 VIEW COMPARISON:  Chest radiograph March 20, 2018 and CT chest December 05, 2017. FINDINGS: Stable RIGHT pneumothorax measuring less than 1 cm from the chest wall; ipsilateral mediastinal shift. Diffuse interstitial prominence with nodular  densities in airspace opacities RIGHT long. No pleural effusion. Stable cardiomegaly. Mildly calcified aortic arch. Osteopenia. IMPRESSION: 1. Stable small RIGHT pneumothorax. 2. Chronic interstitial lung disease with RIGHT upper lobe nodular component and scarring/atelectasis. 3.  Aortic Atherosclerosis (ICD10-I70.0). Electronically Signed   By: Elon Alas M.D.   On: 03/21/2018 00:18   Dg Chest Port 1 View  Result Date: 03/21/2018 CLINICAL DATA:  Follow-up pneumothorax EXAM: PORTABLE CHEST 1 VIEW COMPARISON:  03/21/2018 FINDINGS: Stable right apical pneumothorax is noted. Diffuse interstitial disease is seen bilaterally stable from the prior exam. No new focal abnormality is seen. Cardiac shadow is stable. No bony abnormality is seen. IMPRESSION: Stable right pneumothorax. Stable interstitial changes. Electronically Signed   By: Inez Catalina M.D.   On: 03/21/2018 08:43    ASSESSMENT / PLAN:  85 year old female history of pulmonary fibrosis, followed by Dr Chase Caller presumed idiopathic pulmonary fibrosis, who presented with 1 day of nonproductive cough to [facility], found to have a small right apical pneumothorax.  Was transferred to [facility] when the hospitalist refused to admit there without a DNR.   # Right apical pneumothorax # history of previous left pneumothoraces -Stable on the chest x-ray this am. Weaning oxygen.  Influenza neg swab.  Hemodynamically stable and appears at her baseline in terms of respiratory status aside from cough. Possible acute Bronchitis .  Recommend oxygen supplementation to encourage resorption of pneumothorax,   - recurrent pneumothorax assumably due to her cough. And underlying PF .  -consider discharge later today or in am if stable  -can set up OP with pulmonary later this week with follow up chest xray in office .    #presumed Idiopathic pulmonary fibrosis - CT with subpleural reticulation, some traction bronchiectasis without  honeycombing.  No true apical basilar predominance.  Non-UIP pattern. Presumed IPF based on age and also presence o of traction bronchiectasis and also negative autoimmune profile -Aside from her cough this week her symptoms are stable.  Not on any anti-fibrotic therapy. Continue bronchodilators PRN - continue follow up with Dr Chase Caller  -cough control As needed    Intermittent confusion ? Etiology -unclear baseline  -wbc ok and afebrile , no new infiltrates on CXR  -might consider UA w/ micro to r/o UTI .     NP-C  [facility]  [phone]  03/21/2018   03/21/2018, 10:39 AM

## 2018-03-21 NOTE — Consult Note (Addendum)
Name: [name] MRN: [id number] DOB: 02/08/1925    ADMISSION DATE:  03/20/2018 CONSULTATION DATE:  03/21/18  REFERRING MD :  Dr Hal Hope  CHIEF COMPLAINT:  cough  BRIEF PATIENT DESCRIPTION: 85 year old female with pulmonary fibrosis admitted for tiny apical right pneumothorax  SIGNIFICANT EVENTS  3/7 transferred from [place]:  CXR 3/6 small right pneumothorax   HISTORY OF PRESENT ILLNESS: 85 year old female history of pulmonary fibrosis who presented with 1 day of nonproductive cough to [facility].  Denies fevers, chills, night sweats, nausea, vomiting, myalgias, flulike symptoms or chest pain although was complaining of some pleuritic type left-sided pain at [facility]. there she was found to have a small right apical pneumothorax.  Per the documentation from [facility] Dr. Leory Plowman with hospital medicine was asked to admit the patient however he was not willing to admit the patient without being a DO NOT RESUSCITATE and the family refused.  Therefore the patient was discussed with Dr. Nelda Marseille and transferred here.  Prior to transfer she was treated with a single dose of methylprednisolone 100 mg IV.  In terms of her baseline she states that her breathing has been stable going back many months.  She is able to do household chores such as sweeping and as long as she does not try to do multiple chores and once she does not have trouble with her breathing.  She was treated for an episode of acute bronchitis with antibiotics and a short course of prednisone in the last few months with improvement in her symptoms.  She is previously had small left pneumothoraces which have not required intervention.  PAST MEDICAL HISTORY :   has a past medical history of Arthritis, Cataract, Chronic back pain, Glaucoma, Hyperlipidemia, Idiopathic pulmonary fibrosis ([place]), Lumbar stenosis, Osteoarthrosis and allied disorders, Osteoporosis, Peripheral neuropathy, and Vitamin D deficiency.  has  a past surgical history that includes Lumbar laminectomy; Knee arthroscopy; Cataract extraction; Abdominal hysterectomy; Cataract surgery (Bilateral); and Eyelid surgery. Prior to Admission medications   Medication Sig Start Date End Date Taking? Authorizing Provider  albuterol (PROVENTIL) (2.5 MG/3ML) 0.083% nebulizer solution NEBULIZE 1 VIAL EVERY 6 HOURS AS NEEDED 02/18/18   Chipper Herb, MD  alendronate (FOSAMAX) 70 MG tablet TAKE 1 TABLET WEEKLY (TAKE WITH 8OZ OF WATER 30 MINUTES BEFORE BREAKFAST) 11/03/17   Chipper Herb, MD  aspirin EC 81 MG tablet Take 1 tablet (81 mg total) by mouth daily. 04/10/17   Satira Sark, MD  BREO ELLIPTA 100-25 MCG/INH AEPB USE 1 INHALATION DAILY 02/11/18   Chipper Herb, MD  busPIRone (BUSPAR) 10 MG tablet Take 1 tablet (10 mg total) by mouth 3 (three) times daily. 02/18/18   Chipper Herb, MD  calcium carbonate (OS-CAL) 600 MG TABS tablet Take 600 mg by mouth daily with breakfast.    [provider]  cephALEXin (KEFLEX) 500 MG capsule Take 1 capsule by mouth 3 (three) times daily. X 1 week - then 1 nightly (dr Jeffie Pollock) 04/09/16   [provider]  cholecalciferol (VITAMIN D) 1000 UNITS tablet Take 1,000 Units by mouth daily.    [provider]  famotidine (PEPCID) 20 MG tablet Take 20 mg by mouth daily.    [provider]  fluticasone Asencion Islam) 50 MCG/ACT nasal spray USE 2 SPRAYS IN EACH NOSTRIL DAILY 06/23/17   Chipper Herb, MD  ibuprofen (ADVIL,MOTRIN) 200 MG tablet Take 200 mg by mouth every 6 (six) hours as needed.  [provider]  megestrol (MEGACE) 40 MG tablet TAKE 1/2 TABLET TWICE DAILY 01/15/18   Chipper Herb, MD  Multiple Vitamins-Minerals (CENTRUM SILVER) tablet Take 1 tablet by mouth every evening.    [provider]  nitroGLYCERIN (NITROSTAT) 0.4 MG SL tablet Place 1 tablet (0.4 mg total) under the tongue every 5 (five) minutes as needed for chest pain. 04/10/17 10/14/18  Satira Sark, MD  simvastatin (ZOCOR) 40 MG tablet TAKE ONE (1) TABLET EACH DAY 11/03/17   Chipper Herb, MD   Allergies  Allergen Reactions  . Levaquin [Levaquin Leva-Pak] Shortness Of Breath and Palpitations    Patient cannot remember that she is sensitive to this medication; has never heard of it.  . Mucinex [Guaifenesin Er] Other (See Comments)    Causes anxiety  . Bentyl [Dicyclomine Hcl] Other (See Comments)    shakes  . Erythromycin Diarrhea  . Penicillins Rash    FAMILY HISTORY:  family history includes Asthma in her daughter and son; Bone cancer in her mother; Breast cancer in her sister; COPD in her son; Diabetes in her brother. SOCIAL HISTORY:  reports that she has never smoked. She has never used smokeless tobacco. She reports that she does not drink alcohol or use drugs.  SUBJECTIVE:   REVIEW OF SYSTEMS:   Full Review of Systems negative except otherwise noted as above  VITAL SIGNS: Temp:  [98 F (36.7 C)-98.8 F (37.1 C)] 98.8 F (37.1 C) (03/06 2353) Pulse Rate:  [77-82] 77 (03/06 2353) Resp:  [16-20] 16 (03/06 2353) BP: (137-156)/(68-69) 137/69 (03/06 2353) SpO2:  [98 %] 98 % (03/06 2100) Weight:  [49.1 kg] 49.1 kg (03/06 2100)  PHYSICAL EXAMINATION: Physical Exam Constitutional:      Appearance: She is underweight.     Comments: Elderly, frail woman lying in bed in no distress  HENT:     Head: Normocephalic and atraumatic.  Eyes:     General: No scleral icterus.       Right eye: No discharge.        Left eye: No discharge.  Neck:     Vascular: No JVD.  Cardiovascular:     Rate and Rhythm: Normal rate.     Heart sounds: Murmur (Holosystolic heard throughout precordium) present.  Pulmonary:     Effort: No respiratory distress.     Breath sounds: No stridor.     Comments: Scattered crackles worse in the upper lung fields especially the right.  Normal work of breathing, no conversational dyspnea. On room air Abdominal:     General: There is no distension.       Palpations: Abdomen is soft.     Tenderness: There is no abdominal tenderness.  Musculoskeletal:        General: No deformity.     Right lower leg: Edema present.     Left lower leg: Edema present.  Skin:    General: Skin is warm and dry.  Neurological:     Mental Status: She is oriented to person, place, and time.     Cranial Nerves: No cranial nerve deficit.     Comments: Able to provide thorough history though has trouble remembering specific details from several months ago      No results for input(s): NA, K, CL, CO2, BUN, CREATININE, GLUCOSE in the last 168 hours. Recent Labs  Lab 03/20/18 2340  HGB 11.5*  HCT 35.2*  WBC 3.5*  PLT 192   Dg Chest 2 View  Result  Date: 03/21/2018 CLINICAL DATA:  Shortness of breath.  Follow-up pneumothorax. EXAM: CHEST - 2 VIEW COMPARISON:  Chest radiograph March 20, 2018 and CT chest December 05, 2017. FINDINGS: Stable RIGHT pneumothorax measuring less than 1 cm from the chest wall; ipsilateral mediastinal shift. Diffuse interstitial prominence with nodular densities in airspace opacities RIGHT long. No pleural effusion. Stable cardiomegaly. Mildly calcified aortic arch. Osteopenia. IMPRESSION: 1. Stable small RIGHT pneumothorax. 2. Chronic interstitial lung disease with RIGHT upper lobe nodular component and scarring/atelectasis. 3.  Aortic Atherosclerosis (ICD10-I70.0). Electronically Signed   By: Elon Alas M.D.   On: 03/21/2018 00:18    ASSESSMENT / PLAN:  85 year old female history of pulmonary fibrosis, followed by Dr Chase Caller presumed idiopathic pulmonary fibrosis, who presented with 1 day of nonproductive cough to [facility], found to have a small right apical pneumothorax.  Was transferred to [facility] when the hospitalist refused to admit there without a DNR.   # Right apical pneumothorax # history of previous left pneumothoraces -Stable on the chest x-ray here compared to the one prior to transfer.  Less than  2 cm.  No dyspnea.  Hemodynamically stable and appears at her baseline in terms of respiratory status aside from cough.  Therefore recommend oxygen supplementation to encourage resorption of pneumothorax, follow up CXR in the morning and no drainage unless she deteriorates or the pneumothorax increases in size. If it remains stable or decreases in size can consider for discharge as her family is very attentive should be able to monitor at home - recurrent pneumothorax assumably due to her cough.  Recommend checking a viral panel as a cause of her worsening cough though otherwise does not have an infiltrate to suggest pneumonia and if her viral panel is negative I would presume her cough is due to her pulmonary fibrosis itself and only offer supportive treatments  #presumed Idiopathic pulmonary fibrosis - CT with subpleural reticulation, some traction bronchiectasis without honeycombing.  No true apical basilar predominance.  Non-UIP pattern. Presumed IPF based on age and also presence o of traction bronchiectasis and also negative autoimmune profile -Aside from her cough this week her symptoms are stable.  Not on any anti-fibrotic therapy. Continue bronchodilators PRN - continue follow up with Dr Chase Caller   Mali Gazelle Towe, MD Pulmonary and [place] Pager: [phone]  03/21/2018, 12:59 AM

## 2018-03-21 NOTE — Discharge Summary (Addendum)
Discharge Summary  [name] WCB:[id number] DOB: 05-01-1925  PCP: Chipper Herb, MD  Admit date: 03/20/2018 Discharge date: 03/21/2018   Time spent: < 25 minutes  Admitted From: home Disposition:  home  Recommendations for Outpatient Follow-up:  1. Follow up with PCP in 1 week 2. Pulmonary arranging follow up with repeat CXR 3. Hycodan PRN cough x 5 days on discharge    Discharge Diagnoses:  Active Hospital Problems   Diagnosis Date Noted  . Pneumothorax 03/21/2018  . Acute pneumothorax 03/21/2018  . IPF (idiopathic pulmonary fibrosis) ([place]) 08/09/2016  . Chronic cystitis 01/08/2013  . Hyperlipidemia 07/16/2012    Resolved Hospital Problems  No resolved problems to display.    Discharge Condition: STABLE   CODE STATUS:full code   History of present illness:  Subjective:  [name] is a 85 y.o. year old female with medical history significant for idiopathic pulmonary fibrosis, HLD, chronic back pain, lumbar stenosis, OA, peripheral neuropathy and history of previousl left pneomothroax who presented on 03/20/2018 as transfer from [facility] with 1 day of cough and pleuritic left sided chest pain. At [facility] and was found to have a smal right apical pneumothorax. Transferred to [facility] from there after treatment with single dose of methyprednisolone IV.  Remaining hospital course addressed in problem based format below:   Hospital Course:   1. Acute Apical Pneumothorax with history of prior left pneumothoraces in setting of pulmonary fibrosis, stable.  Very likely pneumothorax occurred in the setting of persisting cough which is improved with supportive care.  Repeat chest x-ray here shows no change in the size of pneumothorax.  Patient has remained hemodynamically stable and respiratory status is stable, pulmonary does not plan for any acute interventions given such.  Perform ambulatory oxygen saturation test and maintain SPO2 greater than 98% on room air.  Will  have follow-up arranged with pulmonology clinic as outpatient.  Providing PRN cough Hycodan for 5 days   2. Cough. No infiltrate on CXR to suggest pna. Influenza A negative. Could be related to chronic pulmonary fibrosis with some element of acute bronchitis.  PRN hycodan for support(script for 5 days  3. Presumed idiopathic pulmonary fibrosis. Traction bronchiectasis on CT imaging as outpatient. Relative stable symptoms aside from cough and not on ay therapies. PRN bronchodilators, continue pulm outpatient follow up   4. Reported confusion. Getting confused about place and time intermittently per family, seems consistent with delirium. On my exam she is appropriate.  Likely hospital acquired. Reorientation has been helping. Doubt infectious as CXR is unremarkable, she is on chronic antibiotics for chronic cystitis so do not advise UA--additionally she has no urinary symptoms.Going home to familiar environment will help the most, family agrees  5. Chronic cystitis- continue home antibiotics  6. Chronic anemia, stable, no active bleeding   Consultations:  Pulmonology  Procedures/Studies: none  Discharge Exam: BP (!) 143/59   Pulse 72   Temp (!) 97.3 F (36.3 C) (Oral)   Resp 16   Ht 4\' 9"  (1.448 m)   Wt 49.1 kg   SpO2 99%   BMI 23.42 kg/m    General:  Elderly female, no distress  Cardiovascular: RRR, no edema  Respiratory: on 2L normal respiratory effort, clear breath sounds bilaterally  Abdomen: soft, Non-distended, non-tender  Musculoskeletal: normal ROM   Skin no rashes or lesions  Neurologic no appreciable focal deficits  Psych: alert and oriented to person, place, context.   Discharge Instructions You were cared for by a  hospitalist during your hospital stay. If you have any questions about your discharge medications or the care you received while you were in the hospital after you are discharged, you can call the unit and asked to speak with the hospitalist on  call if the hospitalist that took care of you is not available. Once you are discharged, your primary care physician will handle any further medical issues. Please note that NO REFILLS for any discharge medications will be authorized once you are discharged, as it is imperative that you return to your primary care physician (or establish a relationship with a primary care physician if you do not have one) for your aftercare needs so that they can reassess your need for medications and monitor your lab values.  Discharge Instructions    Diet - low sodium heart healthy   Complete by:  As directed    Increase activity slowly   Complete by:  As directed      Allergies as of 03/21/2018      Reactions   Levaquin [levaquin Leva-pak] Shortness Of Breath, Palpitations   Patient cannot remember that she is sensitive to this medication; has never heard of it.   Mucinex [guaifenesin Er] Other (See Comments)   Causes anxiety   Bentyl [dicyclomine Hcl] Other (See Comments)   shakes   Yellow Dyes (non-tartrazine) Other (See Comments)   unknown   Erythromycin Diarrhea   Penicillins Rash   Did it involve swelling of the face/tongue/throat, SOB, or low BP? No Did it involve sudden or severe rash/hives, skin peeling, or any reaction on the inside of your mouth or nose? No Did you need to seek medical attention at a hospital or doctor's office? No When did it last happen? If all above answers are "NO", may proceed with cephalosporin use.      Medication List    TAKE these medications   albuterol (2.5 MG/3ML) 0.083% nebulizer solution Commonly known as:  PROVENTIL NEBULIZE 1 VIAL EVERY 6 HOURS AS NEEDED What changed:  See the new instructions.   alendronate 70 MG tablet Commonly known as:  FOSAMAX TAKE 1 TABLET WEEKLY (TAKE WITH 8OZ OF WATER 30 MINUTES BEFORE BREAKFAST) What changed:  See the new instructions.   aspirin EC 81 MG tablet Take 1 tablet (81 mg total) by mouth daily.   Breo  Ellipta 100-25 MCG/INH Aepb Generic drug:  fluticasone furoate-vilanterol USE 1 INHALATION DAILY What changed:  See the new instructions.   busPIRone 10 MG tablet Commonly known as:  BUSPAR Take 1 tablet (10 mg total) by mouth 3 (three) times daily.   calcium carbonate 600 MG Tabs tablet Commonly known as:  OS-CAL Take 600 mg by mouth daily with breakfast.   Centrum Silver tablet Take 1 tablet by mouth every evening.   cephALEXin 500 MG capsule Commonly known as:  KEFLEX Take 1 capsule by mouth at bedtime.   cholecalciferol 1000 units tablet Commonly known as:  VITAMIN D Take 1,000 Units by mouth daily.   Delsym 30 MG/5ML liquid Generic drug:  dextromethorphan Take 15 mg by mouth as needed for cough.   famotidine 20 MG tablet Commonly known as:  PEPCID Take 20 mg by mouth daily.   fluticasone 50 MCG/ACT nasal spray Commonly known as:  FLONASE USE 2 SPRAYS IN EACH NOSTRIL DAILY What changed:    how much to take  when to take this  reasons to take this   HYDROcodone-homatropine 5-1.5 MG/5ML syrup Commonly known as:  HYCODAN Take  5 mLs by mouth every 6 (six) hours as needed for up to 5 days for cough.   ibuprofen 200 MG tablet Commonly known as:  ADVIL,MOTRIN Take 200 mg by mouth every 6 (six) hours as needed.   megestrol 40 MG tablet Commonly known as:  MEGACE TAKE 1/2 TABLET TWICE DAILY   nitroGLYCERIN 0.4 MG SL tablet Commonly known as:  NITROSTAT Place 1 tablet (0.4 mg total) under the tongue every 5 (five) minutes as needed for chest pain.   simvastatin 40 MG tablet Commonly known as:  ZOCOR TAKE ONE (1) TABLET EACH DAY What changed:  See the new instructions.      Allergies  Allergen Reactions  . Levaquin [Levaquin Leva-Pak] Shortness Of Breath and Palpitations    Patient cannot remember that she is sensitive to this medication; has never heard of it.  . Mucinex [Guaifenesin Er] Other (See Comments)    Causes anxiety  . Bentyl [Dicyclomine  Hcl] Other (See Comments)    shakes  . Yellow Dyes (Non-Tartrazine) Other (See Comments)    unknown  . Erythromycin Diarrhea  . Penicillins Rash    Did it involve swelling of the face/tongue/throat, SOB, or low BP? No Did it involve sudden or severe rash/hives, skin peeling, or any reaction on the inside of your mouth or nose? No Did you need to seek medical attention at a hospital or doctor's office? No When did it last happen? If all above answers are "NO", may proceed with cephalosporin use.      The results of significant diagnostics from this hospitalization (including imaging, microbiology, ancillary and laboratory) are listed below for reference.    Significant Diagnostic Studies: Dg Chest 2 View  Result Date: 03/21/2018 CLINICAL DATA:  Shortness of breath.  Follow-up pneumothorax. EXAM: CHEST - 2 VIEW COMPARISON:  Chest radiograph March 20, 2018 and CT chest December 05, 2017. FINDINGS: Stable RIGHT pneumothorax measuring less than 1 cm from the chest wall; ipsilateral mediastinal shift. Diffuse interstitial prominence with nodular densities in airspace opacities RIGHT long. No pleural effusion. Stable cardiomegaly. Mildly calcified aortic arch. Osteopenia. IMPRESSION: 1. Stable small RIGHT pneumothorax. 2. Chronic interstitial lung disease with RIGHT upper lobe nodular component and scarring/atelectasis. 3.  Aortic Atherosclerosis (ICD10-I70.0). Electronically Signed   By: Elon Alas M.D.   On: 03/21/2018 00:18   Dg Chest Port 1 View  Result Date: 03/21/2018 CLINICAL DATA:  Follow-up pneumothorax EXAM: PORTABLE CHEST 1 VIEW COMPARISON:  03/21/2018 FINDINGS: Stable right apical pneumothorax is noted. Diffuse interstitial disease is seen bilaterally stable from the prior exam. No new focal abnormality is seen. Cardiac shadow is stable. No bony abnormality is seen. IMPRESSION: Stable right pneumothorax. Stable interstitial changes. Electronically Signed   By: Inez Catalina M.D.    On: 03/21/2018 08:43    Microbiology: No results found for this or any previous visit (from the past 240 hour(s)).   Labs: Basic Metabolic Panel: Recent Labs  Lab 03/20/18 2340 03/21/18 0304  NA 136 139  K 3.8 3.6  CL 106 105  CO2 23 26  GLUCOSE 187* 129*  BUN 13 14  CREATININE 0.74 0.72  CALCIUM 8.6* 8.8*   Liver Function Tests: Recent Labs  Lab 03/20/18 2340  AST 31  ALT QUANTITY NOT SUFFICIENT, UNABLE TO PERFORM TEST  ALKPHOS 53  BILITOT QUANTITY NOT SUFFICIENT, UNABLE TO PERFORM TEST  PROT 5.9*  ALBUMIN 3.0*   No results for input(s): LIPASE, AMYLASE in the last 168 hours. No results for input(s): AMMONIA  in the last 168 hours. CBC: Recent Labs  Lab 03/20/18 2340 03/21/18 0304  WBC 3.5* 4.7  NEUTROABS 3.1  --   HGB 11.5* 11.3*  HCT 35.2* 35.0*  MCV 85.6 86.2  PLT 192 227   Cardiac Enzymes: Recent Labs  Lab 03/20/18 2340  TROPONINI 0.04*   BNP: BNP (last 3 results) Recent Labs    03/20/18 2340  BNP 575.3*    ProBNP (last 3 results) No results for input(s): PROBNP in the last 8760 hours.  CBG: No results for input(s): GLUCAP in the last 168 hours.     Signed:  Desiree Hane, MD [facility] 03/21/2018, 3:50 PM

## 2018-03-25 ENCOUNTER — Other Ambulatory Visit: Payer: Self-pay

## 2018-03-25 ENCOUNTER — Ambulatory Visit (INDEPENDENT_AMBULATORY_CARE_PROVIDER_SITE_OTHER): Payer: Medicare Other | Admitting: Family

## 2018-03-25 ENCOUNTER — Encounter: Payer: Self-pay | Admitting: Family

## 2018-03-25 VITALS — BP 136/71 | HR 76 | Temp 97.7°F | Ht <= 58 in | Wt 105.2 lb

## 2018-03-25 DIAGNOSIS — Z09 Encounter for follow-up examination after completed treatment for conditions other than malignant neoplasm: Secondary | ICD-10-CM

## 2018-03-25 DIAGNOSIS — J9311 Primary spontaneous pneumothorax: Secondary | ICD-10-CM | POA: Diagnosis not present

## 2018-03-25 DIAGNOSIS — J019 Acute sinusitis, unspecified: Secondary | ICD-10-CM | POA: Diagnosis not present

## 2018-03-25 DIAGNOSIS — J849 Interstitial pulmonary disease, unspecified: Secondary | ICD-10-CM | POA: Diagnosis not present

## 2018-03-25 MED ORDER — DOXYCYCLINE HYCLATE 100 MG PO TABS: 100.0000 mg | ORAL_TABLET | Freq: Two times a day (BID) | ORAL | 0 refills | Status: DC

## 2018-03-25 NOTE — Patient Instructions (Signed)
Sinusitis, Adult  Sinusitis is inflammation of your sinuses. Sinuses are hollow spaces in the bones around your face. Your sinuses are located:   Around your eyes.   In the middle of your forehead.   Behind your nose.   In your cheekbones.  Mucus normally drains out of your sinuses. When your nasal tissues become inflamed or swollen, mucus can become trapped or blocked. This allows bacteria, viruses, and fungi to grow, which leads to infection. Most infections of the sinuses are caused by a virus.  Sinusitis can develop quickly. It can last for up to 4 weeks (acute) or for more than 12 weeks (chronic). Sinusitis often develops after a cold.  What are the causes?  This condition is caused by anything that creates swelling in the sinuses or stops mucus from draining. This includes:   Allergies.   Asthma.   Infection from bacteria or viruses.   Deformities or blockages in your nose or sinuses.   Abnormal growths in the nose (nasal polyps).   Pollutants, such as chemicals or irritants in the air.   Infection from fungi (rare).  What increases the risk?  You are more likely to develop this condition if you:   Have a weak body defense system (immune system).   Do a lot of swimming or diving.   Overuse nasal sprays.   Smoke.  What are the signs or symptoms?  The main symptoms of this condition are pain and a feeling of pressure around the affected sinuses. Other symptoms include:   Stuffy nose or congestion.   Thick drainage from your nose.   Swelling and warmth over the affected sinuses.   Headache.   Upper toothache.   A cough that may get worse at night.   Extra mucus that collects in the throat or the back of the nose (postnasal drip).   Decreased sense of smell and taste.   Fatigue.   A fever.   Sore throat.   Bad breath.  How is this diagnosed?  This condition is diagnosed based on:   Your symptoms.   Your medical history.   A physical exam.   Tests to find out if your condition is  acute or chronic. This may include:  ? Checking your nose for nasal polyps.  ? Viewing your sinuses using a device that has a light (endoscope).  ? Testing for allergies or bacteria.  ? Imaging tests, such as an MRI or CT scan.  In rare cases, a bone biopsy may be done to rule out more serious types of fungal sinus disease.  How is this treated?  Treatment for sinusitis depends on the cause and whether your condition is chronic or acute.   If caused by a virus, your symptoms should go away on their own within 10 days. You may be given medicines to relieve symptoms. They include:  ? Medicines that shrink swollen nasal passages (topical intranasal decongestants).  ? Medicines that treat allergies (antihistamines).  ? A spray that eases inflammation of the nostrils (topical intranasal corticosteroids).  ? Rinses that help get rid of thick mucus in your nose (nasal saline washes).   If caused by bacteria, your health care provider may recommend waiting to see if your symptoms improve. Most bacterial infections will get better without antibiotic medicine. You may be given antibiotics if you have:  ? A severe infection.  ? A weak immune system.   If caused by narrow nasal passages or nasal polyps, you may need   to have surgery.  Follow these instructions at home:  Medicines   Take, use, or apply over-the-counter and prescription medicines only as told by your health care provider. These may include nasal sprays.   If you were prescribed an antibiotic medicine, take it as told by your health care provider. Do not stop taking the antibiotic even if you start to feel better.  Hydrate and humidify     Drink enough fluid to keep your urine pale yellow. Staying hydrated will help to thin your mucus.   Use a cool mist humidifier to keep the humidity level in your home above 50%.   Inhale steam for 10-15 minutes, 3-4 times a day, or as told by your health care provider. You can do this in the bathroom while a hot shower is  running.   Limit your exposure to cool or dry air.  Rest   Rest as much as possible.   Sleep with your head raised (elevated).   Make sure you get enough sleep each night.  General instructions     Apply a warm, moist washcloth to your face 3-4 times a day or as told by your health care provider. This will help with discomfort.   Wash your hands often with soap and water to reduce your exposure to germs. If soap and water are not available, use hand sanitizer.   Do not smoke. Avoid being around people who are smoking (secondhand smoke).   Keep all follow-up visits as told by your health care provider. This is important.  Contact a health care provider if:   You have a fever.   Your symptoms get worse.   Your symptoms do not improve within 10 days.  Get help right away if:   You have a severe headache.   You have persistent vomiting.   You have severe pain or swelling around your face or eyes.   You have vision problems.   You develop confusion.   Your neck is stiff.   You have trouble breathing.  Summary   Sinusitis is soreness and inflammation of your sinuses. Sinuses are hollow spaces in the bones around your face.   This condition is caused by nasal tissues that become inflamed or swollen. The swelling traps or blocks the flow of mucus. This allows bacteria, viruses, and fungi to grow, which leads to infection.   If you were prescribed an antibiotic medicine, take it as told by your health care provider. Do not stop taking the antibiotic even if you start to feel better.   Keep all follow-up visits as told by your health care provider. This is important.  This information is not intended to replace advice given to you by your health care provider. Make sure you discuss any questions you have with your health care provider.  Document Released: 12/31/2004 Document Revised: 06/02/2017 Document Reviewed: 06/02/2017  Elsevier Interactive Patient Education  2019 [employer]

## 2018-03-25 NOTE — Progress Notes (Addendum)
Subjective:    Patient ID: [name], female    DOB: [birth year], 85 y.o.   MRN: [id number]  Chief Complaint  Patient presents with  . Cough  . head congestion   Pt presents to the office today with recurrent cough. She has interstitial lung disease and went to the ED on 03/21/18 for pneumothorax. She was given a 5 days of Hycodan cough syrup.   She is scheduled to see her pulmonologist's tomorrow. She states her head congestion has worsen.  Cough  This is a recurrent problem. The current episode started 1 to 4 weeks ago. The problem has been gradually worsening. The problem occurs every few minutes. The cough is productive of sputum. Associated symptoms include ear congestion, headaches, nasal congestion, postnasal drip, shortness of breath and wheezing. Pertinent negatives include no chills, ear pain, fever or sore throat. The symptoms are aggravated by lying down. She has tried rest (flonase, [place], [place]) for the symptoms. interstital lung diseas  Sinusitis  The problem has been gradually worsening since onset. Associated symptoms include congestion, coughing, headaches, shortness of breath and sinus pressure. Pertinent negatives include no chills, ear pain or sore throat.      Review of Systems  Constitutional: Negative for chills and fever.  HENT: Positive for congestion, postnasal drip and sinus pressure. Negative for ear pain and sore throat.   Respiratory: Positive for cough, shortness of breath and wheezing.   Neurological: Positive for headaches.  All other systems reviewed and are negative.      Objective:   Physical Exam Vitals signs reviewed.  Constitutional:      General: She is not in acute distress.    Appearance: She is well-developed.  HENT:     Head: Normocephalic and atraumatic.     Right Ear: Tympanic membrane normal.     Left Ear: Tympanic membrane normal.     Nose: Mucosal edema present.     Right Sinus: Maxillary sinus tenderness present.   Left Sinus: Maxillary sinus tenderness present.     Mouth/Throat:     Pharynx: Posterior oropharyngeal erythema present.  Eyes:     Pupils: Pupils are equal, round, and reactive to light.  Neck:     Musculoskeletal: Normal range of motion.     Thyroid: No thyromegaly.  Cardiovascular:     Rate and Rhythm: Normal rate and regular rhythm.     Heart sounds: Normal heart sounds. No murmur.  Pulmonary:     Effort: Pulmonary effort is normal. No respiratory distress.     Breath sounds: Decreased breath sounds present. No wheezing.     Comments: Intermittent nonproductive cough Abdominal:     General: Bowel sounds are normal. There is no distension.     Palpations: Abdomen is soft.     Tenderness: There is no abdominal tenderness.  Musculoskeletal: Normal range of motion.        General: No tenderness.  Skin:    General: Skin is warm and dry.  Neurological:     Mental Status: She is alert and oriented to person, place, and time.     Cranial Nerves: No cranial nerve deficit.     Deep Tendon Reflexes: Reflexes are normal and symmetric.  Psychiatric:        Behavior: Behavior normal.        Thought Content: Thought content normal.        Judgment: Judgment normal.       BP 136/71   Pulse 76  Temp 97.7 F (36.5 C) (Oral)   Ht 4\' 9"  (1.448 m)   Wt 105 lb 3.2 oz (47.7 kg)   BMI 22.77 kg/m      Assessment & Plan:  [name] comes in today with chief complaint of Cough and head congestion   Diagnosis and orders addressed:  1. Interstitial lung disease ([place])  2. Primary spontaneous pneumothorax  3. Hospital discharge follow-up  4. Acute sinusitis, recurrence not specified, unspecified location - Take meds as prescribed - Use a cool mist humidifier  -Use saline nose sprays frequently -Force fluids -For any cough or congestion  Use plain Mucinex- regular strength or max strength is fine -For fever or aces or pains- take tylenol or ibuprofen. -Throat lozenges if  help -Keep Pulmonologist referral  -RTO if symptoms worsen or does not improve  - doxycycline (VIBRA-TABS) 100 MG tablet; Take 1 tablet (100 mg total) by mouth 2 (two) times daily.  Dispense: 20 tablet; Refill: 0   Given age and worsening symptoms we will treat with antibiotics today. Keep pulmonologists follow up.    Evelina Dun, FNP

## 2018-03-26 ENCOUNTER — Inpatient Hospital Stay: Payer: Medicare Other | Admitting: Adult Health

## 2018-03-26 ENCOUNTER — Ambulatory Visit (INDEPENDENT_AMBULATORY_CARE_PROVIDER_SITE_OTHER): Payer: Medicare Other | Admitting: Adult Health

## 2018-03-26 ENCOUNTER — Ambulatory Visit (INDEPENDENT_AMBULATORY_CARE_PROVIDER_SITE_OTHER)
Admission: RE | Admit: 2018-03-26 | Discharge: 2018-03-26 | Disposition: A | Discharge status: Home / Self Care | Payer: Medicare Other | Source: Ambulatory Visit | Attending: Adult Health | Admitting: Adult Health

## 2018-03-26 ENCOUNTER — Encounter: Payer: Self-pay | Admitting: Adult Health

## 2018-03-26 VITALS — BP 126/74 | HR 85 | Temp 99.4°F | Ht <= 58 in | Wt 103.4 lb

## 2018-03-26 DIAGNOSIS — J84112 Idiopathic pulmonary fibrosis: Secondary | ICD-10-CM | POA: Diagnosis not present

## 2018-03-26 DIAGNOSIS — J9312 Secondary spontaneous pneumothorax: Secondary | ICD-10-CM | POA: Diagnosis not present

## 2018-03-26 DIAGNOSIS — J939 Pneumothorax, unspecified: Secondary | ICD-10-CM | POA: Diagnosis not present

## 2018-03-26 DIAGNOSIS — J209 Acute bronchitis, unspecified: Secondary | ICD-10-CM | POA: Insufficient documentation

## 2018-03-26 MED ORDER — PREDNISONE 10 MG PO TABS: ORAL_TABLET | ORAL | 0 refills | Status: DC

## 2018-03-26 NOTE — Patient Instructions (Addendum)
Finish Doxycycline as directed.  Delsym 2 tsp Twice daily  As needed  Cough. Begin Prednisone 20mg  daily for 3 days .  Saline nasal rinses As needed   Flonase 2 puffs daily .  Begin Zyrtec 10mg  1/2 At bedtime  Daily for 1 week then As needed   Fluids and rest  Follow up Dr. Chase Caller in 3-4 weeks with chest xray .  Please contact office for sooner follow up if symptoms do not improve or worsen or seek emergency care

## 2018-03-26 NOTE — Assessment & Plan Note (Signed)
Appears stable   Plan  Patient Instructions  Finish Doxycycline as directed.  Delsym 2 tsp Twice daily  As needed  Cough. Begin Prednisone 20mg  daily for 3 days .  Saline nasal rinses As needed   Flonase 2 puffs daily .  Begin Zyrtec 10mg  1/2 At bedtime  Daily for 1 week then As needed   Fluids and rest  Follow up Dr. Chase Caller in 3-4 weeks with chest xray .  Please contact office for sooner follow up if symptoms do not improve or worsen or seek emergency care

## 2018-03-26 NOTE — Progress Notes (Signed)
@Patient  ID: [name], female    DOB: 11/11/1925, 85 y.o.   MRN: [id number]  Chief Complaint  Patient presents with  . Follow-up    PTX     Referring provider: Chipper Herb, MD  HPI: 85 year old female followed for idiopathic pulmonary fibrosis Medical history significant for chronic back pain, lumbar stenosis, osteoarthritis, peripheral neuropathy  03/26/2018 Follow up: Pneumothorax , ILD  Patient presents for a post hospital follow-up.  Patient was recently seen in the hospital last week after a small apical pneumothorax on the left.  Patient was treated with supportive care.  Chest x-ray showed no acute infiltrate.  Was given medications for cough control.  Since discharge patient is feeling Chest x-ray today showed decreased small apical pnuemothorax .  Patient has been having increased cough and congestion.  Was seen by primary care provider and diagnosed with bronchitis yesterday and started on doxycycline. Has a lot of sinus drainage and watery eyes   Allergies  Allergen Reactions  . Levaquin [Levaquin Leva-Pak] Shortness Of Breath and Palpitations    Patient cannot remember that she is sensitive to this medication; has never heard of it.  . Mucinex [Guaifenesin Er] Other (See Comments)    Causes anxiety  . Bentyl [Dicyclomine Hcl] Other (See Comments)    shakes  . Yellow Dyes (Non-Tartrazine) Other (See Comments)    unknown  . Erythromycin Diarrhea  . Penicillins Rash    Did it involve swelling of the face/tongue/throat, SOB, or low BP? No Did it involve sudden or severe rash/hives, skin peeling, or any reaction on the inside of your mouth or nose? No Did you need to seek medical attention at a hospital or doctor's office? No When did it last happen? If all above answers are "NO", may proceed with cephalosporin use.    Immunization History  Administered Date(s) Administered  . Influenza Split 10/28/2012  . Influenza Whole 09/14/2009  . Influenza,  High Dose Seasonal PF 10/07/2016, 11/11/2017  . Influenza, Quadrivalent, Recombinant, Inj, Pf 11/08/2015  . Influenza,inj,Quad PF,6+ Mos 10/24/2014  . Influenza,inj,quad, With Preservative 11/10/2013  . Pneumococcal Conjugate-13 12/31/2012  . Pneumococcal Polysaccharide-23 10/14/1996  . Td 10/15/2002  . Zoster 02/05/2010    Past Medical History:  Diagnosis Date  . Arthritis   . Cataract   . Chronic back pain   . Glaucoma   . Hyperlipidemia   . Idiopathic pulmonary fibrosis ([place])   . Lumbar stenosis   . Osteoarthrosis and allied disorders   . Osteoporosis   . Peripheral neuropathy   . Vitamin D deficiency     Tobacco History: Social History   Tobacco Use  Smoking Status Never Smoker  Smokeless Tobacco Never Used   Counseling given: Not Answered   Outpatient Medications Prior to Visit  Medication Sig Dispense Refill  . albuterol (PROVENTIL) (2.5 MG/3ML) 0.083% nebulizer solution NEBULIZE 1 VIAL EVERY 6 HOURS AS NEEDED (Patient taking differently: Take 2.5 mg by nebulization at bedtime. ) 150 mL 5  . alendronate (FOSAMAX) 70 MG tablet TAKE 1 TABLET WEEKLY (TAKE WITH 8OZ OF WATER 30 MINUTES BEFORE BREAKFAST) (Patient taking differently: Take 70 mg by mouth once a week. Monday) 12 tablet 2  . aspirin EC 81 MG tablet Take 1 tablet (81 mg total) by mouth daily. 90 tablet 3  . BREO ELLIPTA 100-25 MCG/INH AEPB USE 1 INHALATION DAILY (Patient taking differently: Inhale 1 puff into the lungs daily. ) 60 each 2  . busPIRone (BUSPAR) 10 MG tablet Take  1 tablet (10 mg total) by mouth 3 (three) times daily. 270 tablet 1  . calcium carbonate (OS-CAL) 600 MG TABS tablet Take 600 mg by mouth daily with breakfast.    . cephALEXin (KEFLEX) 500 MG capsule Take 1 capsule by mouth at bedtime.     . cholecalciferol (VITAMIN D) 1000 UNITS tablet Take 1,000 Units by mouth daily.    Marland Kitchen dextromethorphan (DELSYM) 30 MG/5ML liquid Take 15 mg by mouth as needed for cough.    . doxycycline (VIBRA-TABS)  100 MG tablet Take 1 tablet (100 mg total) by mouth 2 (two) times daily. 20 tablet 0  . famotidine (PEPCID) 20 MG tablet Take 20 mg by mouth daily.    . fluticasone (FLONASE) 50 MCG/ACT nasal spray USE 2 SPRAYS IN EACH NOSTRIL DAILY (Patient taking differently: Place 1 spray into both nostrils as needed for allergies or rhinitis. ) 16 g 11  . HYDROcodone-homatropine (HYCODAN) 5-1.5 MG/5ML syrup Take 5 mLs by mouth every 6 (six) hours as needed for up to 5 days for cough. 100 mL 0  . ibuprofen (ADVIL,MOTRIN) 200 MG tablet Take 200 mg by mouth every 6 (six) hours as needed.    . megestrol (MEGACE) 40 MG tablet TAKE 1/2 TABLET TWICE DAILY (Patient taking differently: Take 20 mg by mouth 2 (two) times daily. ) 90 tablet 0  . Multiple Vitamins-Minerals (CENTRUM SILVER) tablet Take 1 tablet by mouth every evening.    . nitroGLYCERIN (NITROSTAT) 0.4 MG SL tablet Place 1 tablet (0.4 mg total) under the tongue every 5 (five) minutes as needed for chest pain. 25 tablet 3  . simvastatin (ZOCOR) 40 MG tablet TAKE ONE (1) TABLET EACH DAY (Patient taking differently: Take 40 mg by mouth daily. ) 90 tablet 1   Facility-Administered Medications Prior to Visit  Medication Dose Route Frequency Provider Last Rate Last Dose  . cyanocobalamin ((VITAMIN B-12)) injection 1,000 mcg  1,000 mcg Intramuscular Q30 days Chipper Herb, MD   1,000 mcg at 03/18/18 1425     Review of Systems:   Constitutional:   No  weight loss, night sweats,  Fevers, chills, fatigue, or  lassitude.  HEENT:   No headaches,  Difficulty swallowing,  Tooth/dental problems, or  Sore throat,                No sneezing, itching, ear ache,  +nasal congestion, post nasal drip,   CV:  No chest pain,  Orthopnea, PND, swelling in lower extremities, anasarca, dizziness, palpitations, syncope.   GI  No heartburn, indigestion, abdominal pain, nausea, vomiting, diarrhea, change in bowel habits, loss of appetite, bloody stools.   Resp:   No chest wall  deformity  Skin: no rash or lesions.  GU: no dysuria, change in color of urine, no urgency or frequency.  No flank pain, no hematuria   MS:  No joint pain or swelling.  No decreased range of motion.  No back pain.    Physical Exam  BP 126/74 (BP Location: Left Arm, Cuff Size: Normal)   Pulse 85   Temp 99.4 F (37.4 C) (Oral)   Ht 4\' 10"  (1.473 m)   Wt 103 lb 6.4 oz (46.9 kg)   SpO2 97%   BMI 21.61 kg/m   GEN: A/Ox3; pleasant , NAD, frail and elderly    HEENT:  [place]/AT,  EACs-clear, TMs-wnl, NOSE-clear drainage , THROAT-clear, no lesions, no postnasal drip or exudate noted.   NECK:  Supple w/ fair ROM; no JVD; normal carotid  impulses w/o bruits; no thyromegaly or nodules palpated; no lymphadenopathy.    RESP  Clear  P & A; w/o, wheezes/ rales/ or rhonchi. no accessory muscle use, no dullness to percussion  CARD:  RRR, no m/r/g, tr  peripheral edema, pulses intact, no cyanosis or clubbing.  GI:   Soft & nt; nml bowel sounds; no organomegaly or masses detected.   Musco: Warm bil, no deformities or joint swelling noted.   Neuro: alert, no focal deficits noted.    Skin: Warm, no lesions or rashes    Lab Results:  CBC    Component Value Date/Time   WBC 4.7 03/21/2018 0304   RBC 4.06 03/21/2018 0304   HGB 11.3 (L) 03/21/2018 0304   HGB 12.7 02/11/2018 1620   HCT 35.0 (L) 03/21/2018 0304   HCT 39.1 02/11/2018 1620   PLT 227 03/21/2018 0304   PLT 297 02/11/2018 1620   MCV 86.2 03/21/2018 0304   MCV 87 02/11/2018 1620   MCH 27.8 03/21/2018 0304   MCHC 32.3 03/21/2018 0304   RDW 13.2 03/21/2018 0304   RDW 12.8 02/11/2018 1620   LYMPHSABS 0.2 (L) 03/20/2018 2340   LYMPHSABS 1.1 02/11/2018 1620   MONOABS 0.1 03/20/2018 2340   EOSABS 0.0 03/20/2018 2340   EOSABS 0.2 02/11/2018 1620   BASOSABS 0.0 03/20/2018 2340   BASOSABS 0.0 02/11/2018 1620    BMET    Component Value Date/Time   NA 139 03/21/2018 0304   NA 144 02/11/2018 1620   K 3.6 03/21/2018 0304   CL  105 03/21/2018 0304   CO2 26 03/21/2018 0304   GLUCOSE 129 (H) 03/21/2018 0304   BUN 14 03/21/2018 0304   BUN 9 (L) 02/11/2018 1620   CREATININE 0.72 03/21/2018 0304   CREATININE 0.67 07/16/2012 1151   CALCIUM 8.8 (L) 03/21/2018 0304   GFRNONAA >60 03/21/2018 0304   GFRNONAA 80 07/16/2012 1151   GFRAA >60 03/21/2018 0304   GFRAA >89 07/16/2012 1151    BNP    Component Value Date/Time   BNP 575.3 (H) 03/20/2018 2340    ProBNP No results found for: PROBNP  Imaging: Dg Chest 2 View  Result Date: 03/26/2018 CLINICAL DATA:  Follow-up right pneumothorax EXAM: CHEST - 2 VIEW COMPARISON:  03/21/2018 FINDINGS: Cardiac shadow is stable. Aortic calcifications are again seen. Chronic interstitial changes are again identified throughout both lungs. Right apical pneumothorax is again noted with decrease in size when compared with the prior study. No new focal abnormality is noted. IMPRESSION: Improving but persistent right apical pneumothorax. Electronically Signed   By: Inez Catalina M.D.   On: 03/26/2018 15:56   Dg Chest 2 View  Result Date: 03/21/2018 CLINICAL DATA:  Shortness of breath.  Follow-up pneumothorax. EXAM: CHEST - 2 VIEW COMPARISON:  Chest radiograph March 20, 2018 and CT chest December 05, 2017. FINDINGS: Stable RIGHT pneumothorax measuring less than 1 cm from the chest wall; ipsilateral mediastinal shift. Diffuse interstitial prominence with nodular densities in airspace opacities RIGHT long. No pleural effusion. Stable cardiomegaly. Mildly calcified aortic arch. Osteopenia. IMPRESSION: 1. Stable small RIGHT pneumothorax. 2. Chronic interstitial lung disease with RIGHT upper lobe nodular component and scarring/atelectasis. 3.  Aortic Atherosclerosis (ICD10-I70.0). Electronically Signed   By: Elon Alas M.D.   On: 03/21/2018 00:18   Dg Chest Port 1 View  Result Date: 03/21/2018 CLINICAL DATA:  Follow-up pneumothorax EXAM: PORTABLE CHEST 1 VIEW COMPARISON:  03/21/2018 FINDINGS:  Stable right apical pneumothorax is noted. Diffuse interstitial disease is seen bilaterally  stable from the prior exam. No new focal abnormality is seen. Cardiac shadow is stable. No bony abnormality is seen. IMPRESSION: Stable right pneumothorax. Stable interstitial changes. Electronically Signed   By: Inez Catalina M.D.   On: 03/21/2018 08:43    cyanocobalamin ((VITAMIN B-12)) injection 1,000 mcg    Date Action Dose Route User   Discharged on 03/21/2018   Admitted on 03/20/2018   03/18/2018 1425 Given 1000 mcg Intramuscular (Left Deltoid) Jacinto Halim K   02/18/2018 1532 Given 1000 mcg Intramuscular (Right Deltoid) Rutherford, Lanelle Bal K      No flowsheet data found.  No results found for: NITRICOXIDE      Assessment & Plan:   IPF (idiopathic pulmonary fibrosis) (HCC) Appears stable   Plan  Patient Instructions  Finish Doxycycline as directed.  Delsym 2 tsp Twice daily  As needed  Cough. Begin Prednisone 20mg  daily for 3 days .  Saline nasal rinses As needed   Flonase 2 puffs daily .  Begin Zyrtec 10mg  1/2 At bedtime  Daily for 1 week then As needed   Fluids and rest  Follow up Dr. Chase Caller in 3-4 weeks with chest xray .  Please contact office for sooner follow up if symptoms do not improve or worsen or seek emergency care       Pneumothorax Left apical pneumothorax -improving on chest xray  Control for cough .   Plan  Patient Instructions  Finish Doxycycline as directed.  Delsym 2 tsp Twice daily  As needed  Cough. Begin Prednisone 20mg  daily for 3 days .  Saline nasal rinses As needed   Flonase 2 puffs daily .  Begin Zyrtec 10mg  1/2 At bedtime  Daily for 1 week then As needed   Fluids and rest  Follow up Dr. Chase Caller in 3-4 weeks with chest xray .  Please contact office for sooner follow up if symptoms do not improve or worsen or seek emergency care       Acute bronchitis Flare/AR  Short course of steroids  Control for triggers  Plan  Patient  Instructions  Finish Doxycycline as directed.  Delsym 2 tsp Twice daily  As needed  Cough. Begin Prednisone 20mg  daily for 3 days .  Saline nasal rinses As needed   Flonase 2 puffs daily .  Begin Zyrtec 10mg  1/2 At bedtime  Daily for 1 week then As needed   Fluids and rest  Follow up Dr. Chase Caller in 3-4 weeks with chest xray .  Please contact office for sooner follow up if symptoms do not improve or worsen or seek emergency care          Rexene Edison, NP 03/26/2018

## 2018-03-26 NOTE — Assessment & Plan Note (Signed)
Left apical pneumothorax -improving on chest xray  Control for cough .   Plan  Patient Instructions  Finish Doxycycline as directed.  Delsym 2 tsp Twice daily  As needed  Cough. Begin Prednisone 20mg  daily for 3 days .  Saline nasal rinses As needed   Flonase 2 puffs daily .  Begin Zyrtec 10mg  1/2 At bedtime  Daily for 1 week then As needed   Fluids and rest  Follow up Dr. Chase Caller in 3-4 weeks with chest xray .  Please contact office for sooner follow up if symptoms do not improve or worsen or seek emergency care

## 2018-03-26 NOTE — Assessment & Plan Note (Signed)
Flare/AR  Short course of steroids  Control for triggers  Plan  Patient Instructions  Finish Doxycycline as directed.  Delsym 2 tsp Twice daily  As needed  Cough. Begin Prednisone 20mg  daily for 3 days .  Saline nasal rinses As needed   Flonase 2 puffs daily .  Begin Zyrtec 10mg  1/2 At bedtime  Daily for 1 week then As needed   Fluids and rest  Follow up Dr. Chase Caller in 3-4 weeks with chest xray .  Please contact office for sooner follow up if symptoms do not improve or worsen or seek emergency care

## 2018-04-14 ENCOUNTER — Other Ambulatory Visit: Payer: Medicare Other

## 2018-04-14 ENCOUNTER — Telehealth: Payer: Self-pay | Admitting: Nurse Practitioner

## 2018-04-14 DIAGNOSIS — J9311 Primary spontaneous pneumothorax: Secondary | ICD-10-CM

## 2018-04-14 NOTE — Telephone Encounter (Signed)
Patient needed to be seen not a televisit. Called and let patient know. She is also going to set up mychart with nurse when she comes in.

## 2018-04-16 ENCOUNTER — Telehealth: Payer: Self-pay

## 2018-04-16 ENCOUNTER — Ambulatory Visit: Payer: Medicare Other | Admitting: Cardiology

## 2018-04-16 ENCOUNTER — Ambulatory Visit: Payer: Medicare Other | Admitting: Internal Medicine

## 2018-04-16 ENCOUNTER — Other Ambulatory Visit: Payer: Self-pay

## 2018-04-16 ENCOUNTER — Ambulatory Visit: Payer: Medicare Other

## 2018-04-16 ENCOUNTER — Other Ambulatory Visit: Payer: Medicare Other

## 2018-04-16 NOTE — Telephone Encounter (Signed)
IPF PRO registry  Today I placed a call to Ms. [name], Daughter of Almadelia Looman who is a participant in the IPF PRO registry clinical trial. The purpose of the call was to schedule an appointment with the subject to come into the clinic to have the blood collection and patient questionnaires completed. It was discovered that the subejct had an appointment in clinic on [id number] and the coordinator wanted to coordinate the visits together. The daughter however declined to have the subject consent to blood collection and questionnaires due to the subjects weakened state. Then daughter was very apologetic but maintained the stance that her mother was in no shape to have the assessments completed. Ms. Bishop Dublin was thanked for her time and the call was ended.  Rosaland Lao

## 2018-04-17 ENCOUNTER — Other Ambulatory Visit: Payer: Self-pay

## 2018-04-17 ENCOUNTER — Encounter: Payer: Self-pay | Admitting: Nurse Practitioner

## 2018-04-17 ENCOUNTER — Ambulatory Visit: Payer: Medicare Other | Admitting: Internal Medicine

## 2018-04-17 ENCOUNTER — Ambulatory Visit (INDEPENDENT_AMBULATORY_CARE_PROVIDER_SITE_OTHER): Payer: Medicare Other | Admitting: *Deleted

## 2018-04-17 ENCOUNTER — Ambulatory Visit: Payer: Medicare Other

## 2018-04-17 ENCOUNTER — Ambulatory Visit (INDEPENDENT_AMBULATORY_CARE_PROVIDER_SITE_OTHER): Payer: Medicare Other

## 2018-04-17 ENCOUNTER — Ambulatory Visit (INDEPENDENT_AMBULATORY_CARE_PROVIDER_SITE_OTHER): Payer: Medicare Other | Admitting: Nurse Practitioner

## 2018-04-17 DIAGNOSIS — E538 Deficiency of other specified B group vitamins: Secondary | ICD-10-CM | POA: Diagnosis not present

## 2018-04-17 DIAGNOSIS — J9311 Primary spontaneous pneumothorax: Secondary | ICD-10-CM

## 2018-04-17 DIAGNOSIS — J939 Pneumothorax, unspecified: Secondary | ICD-10-CM | POA: Diagnosis not present

## 2018-04-17 NOTE — Assessment & Plan Note (Signed)
Discussion: Patient is following up after hospitalization recently with small pneumothorax.  She was treated with supportive care.  She had a chest x-ray this morning which showed almost complete resolution.  She states that she has been doing well and has much more energy.  She is able to perform activities of daily living such as sweeping and cooking.  Patient Instructions  Flonase 2 puffs daily  Continue Zyrtec 10mg  1/2 At bedtime  daily for the next couple of weeks Fluids and rest  Continue current medications  Follow up: Follow up Dr. Chase Caller in  4 months Please contact office for sooner follow up if symptoms do not improve or worsen or seek emergency care

## 2018-04-17 NOTE — Progress Notes (Signed)
@Patient  ID: [name], female    DOB: 29-Oct-1925, 85 y.o.   MRN: [id number]  Chief Complaint  Patient presents with   Follow-up    Secondary spontaneous pneumothorax    Referring provider: Chipper Herb, MD  HPI 85 year old female followed for idiopathic pulmonary fibrosis Medical history significant for chronic back pain, lumbar stenosis, osteoarthritis, peripheral neuropathy  Tests:  CXR 04/17/18 - Almost complete resolution of the small right apically pneumothorax. Stable chronic interstitial lung disease. CXR 03/26/18 - Improving but persistent right apical pneumothorax.   Simple office walk 185 feet x  3 laps goal with forehead probe 05/27/2017  09/05/2017   O2 used Room air Room air  Number laps completed 3 3  Comments about pace ver slow but independent   Resting Pulse Ox/HR 100% and 65/min 98% and HR 73/min  Final Pulse Ox/HR 99% and 88/min 98% and 84/min  Desaturated </= 88% no no  Desaturated <= 3% points no no  Got Tachycardic >/= 90/min  no  Symptoms at end of test Mild fatigute and dyspne   Miscellaneous comments stable    Last OV Tammy Parrett 12/23/17:  Pneumothorax Left apical pneumothorax -improving on chest xray  Control for cough .   Plan  Patient Instructions  Finish Doxycycline as directed.  Delsym 2 tsp Twice daily  As needed  Cough. Begin Prednisone 20mg  daily for 3 days .  Saline nasal rinses As needed   Flonase 2 puffs daily .  Begin Zyrtec 10mg  1/2 At bedtime  Daily for 1 week then As needed   Fluids and rest  Follow up Dr. Chase Caller in 3-4 weeks with chest xray .  Please contact office for sooner follow up if symptoms do not improve or worsen or seek emergency care   OV 04/17/18 - Follow up Presents today for follow-up visit.  Was last seen by Sydnee Levans on 12/23/2017.  Patient is following up after hospitalization recently with small pneumothorax.  She was treated with supportive care.  She had a chest x-ray this morning  which showed almost complete resolution.  She states that she has been doing well and has much more energy.  She is able to perform activities of daily living such as sweeping and cooking.  She is compliant with Flonase and Zyrtec. Denies f/c/s, n/v/d, hemoptysis, PND, leg swelling Denies chest pain or edema    Allergies  Allergen Reactions   Levaquin [Levaquin Leva-Pak] Shortness Of Breath and Palpitations    Patient cannot remember that she is sensitive to this medication; has never heard of it.   Mucinex [Guaifenesin Er] Other (See Comments)    Causes anxiety   Bentyl [Dicyclomine Hcl] Other (See Comments)    shakes   Yellow Dyes (Non-Tartrazine) Other (See Comments)    unknown   Erythromycin Diarrhea   Penicillins Rash    Did it involve swelling of the face/tongue/throat, SOB, or low BP? No Did it involve sudden or severe rash/hives, skin peeling, or any reaction on the inside of your mouth or nose? No Did you need to seek medical attention at a hospital or doctor's office? No When did it last happen? If all above answers are NO, may proceed with cephalosporin use.    Immunization History  Administered Date(s) Administered   Influenza Split 10/28/2012   Influenza Whole 09/14/2009   Influenza, High Dose Seasonal PF 10/07/2016, 11/11/2017   Influenza, Quadrivalent, Recombinant, Inj, Pf 11/08/2015   Influenza,inj,Quad PF,6+ Mos 10/24/2014   Influenza,inj,quad, With  Preservative 11/10/2013   Pneumococcal Conjugate-13 12/31/2012   Pneumococcal Polysaccharide-23 10/14/1996   Td 10/15/2002   Zoster 02/05/2010    Past Medical History:  Diagnosis Date   Arthritis    Cataract    Chronic back pain    Glaucoma    Hyperlipidemia    Idiopathic pulmonary fibrosis (HCC)    Lumbar stenosis    Osteoarthrosis and allied disorders    Osteoporosis    Peripheral neuropathy    Vitamin D deficiency     Tobacco History: Social History   Tobacco  Use  Smoking Status Never Smoker  Smokeless Tobacco Never Used   Counseling given: Yes   Outpatient Encounter Medications as of 04/17/2018  Medication Sig   albuterol (PROVENTIL) (2.5 MG/3ML) 0.083% nebulizer solution NEBULIZE 1 VIAL EVERY 6 HOURS AS NEEDED (Patient taking differently: Take 2.5 mg by nebulization at bedtime. )   alendronate (FOSAMAX) 70 MG tablet TAKE 1 TABLET WEEKLY (TAKE WITH 8OZ OF WATER 30 MINUTES BEFORE BREAKFAST) (Patient taking differently: Take 70 mg by mouth once a week. Monday)   aspirin EC 81 MG tablet Take 1 tablet (81 mg total) by mouth daily.   BREO ELLIPTA 100-25 MCG/INH AEPB USE 1 INHALATION DAILY (Patient taking differently: Inhale 1 puff into the lungs daily. )   busPIRone (BUSPAR) 10 MG tablet Take 1 tablet (10 mg total) by mouth 3 (three) times daily.   calcium carbonate (OS-CAL) 600 MG TABS tablet Take 600 mg by mouth daily with breakfast.   cephALEXin (KEFLEX) 500 MG capsule Take 500 mg by mouth daily. Uses as suppressive therapy for bladder infections   cholecalciferol (VITAMIN D) 1000 UNITS tablet Take 1,000 Units by mouth daily.   Dextromethorphan-guaiFENesin (ROBITUSSIN DM PO) Take by mouth 2 (two) times daily.   famotidine (PEPCID) 20 MG tablet Take 20 mg by mouth daily.   fluticasone (FLONASE) 50 MCG/ACT nasal spray USE 2 SPRAYS IN EACH NOSTRIL DAILY (Patient taking differently: Place 1 spray into both nostrils as needed for allergies or rhinitis. )   ibuprofen (ADVIL,MOTRIN) 200 MG tablet Take 200 mg by mouth every 6 (six) hours as needed.   megestrol (MEGACE) 40 MG tablet TAKE 1/2 TABLET TWICE DAILY (Patient taking differently: Take 20 mg by mouth 2 (two) times daily. )   Multiple Vitamins-Minerals (CENTRUM SILVER) tablet Take 1 tablet by mouth every evening.   nitroGLYCERIN (NITROSTAT) 0.4 MG SL tablet Place 1 tablet (0.4 mg total) under the tongue every 5 (five) minutes as needed for chest pain.   simvastatin (ZOCOR) 40 MG tablet  TAKE ONE (1) TABLET EACH DAY (Patient taking differently: Take 40 mg by mouth daily. )   [DISCONTINUED] cephALEXin (KEFLEX) 500 MG capsule Take 1 capsule by mouth at bedtime.    [DISCONTINUED] dextromethorphan (DELSYM) 30 MG/5ML liquid Take 15 mg by mouth as needed for cough.   [DISCONTINUED] doxycycline (VIBRA-TABS) 100 MG tablet Take 1 tablet (100 mg total) by mouth 2 (two) times daily. (Patient not taking: Reported on 04/17/2018)   [DISCONTINUED] predniSONE (DELTASONE) 10 MG tablet 2 tabs daily for 3 days (Patient not taking: Reported on 04/17/2018)   Facility-Administered Encounter Medications as of 04/17/2018  Medication   cyanocobalamin ((VITAMIN B-12)) injection 1,000 mcg     Review of Systems  Review of Systems  Constitutional: Negative.  Negative for chills and fever.  HENT: Negative.   Respiratory: Negative for cough, shortness of breath and wheezing.   Cardiovascular: Negative.  Negative for chest pain, palpitations and leg swelling.  Gastrointestinal: Negative.   Allergic/Immunologic: Negative.   Neurological: Negative.   Psychiatric/Behavioral: Negative.        Physical Exam  BP 132/68 (BP Location: Right Arm, Patient Position: Sitting, Cuff Size: Normal)    Pulse 74    Temp 98.2 F (36.8 C)    Ht 4\' 10"  (1.473 m)    Wt 106 lb (48.1 kg)    SpO2 96%    BMI 22.15 kg/m   Wt Readings from Last 5 Encounters:  04/17/18 106 lb (48.1 kg)  03/26/18 103 lb 6.4 oz (46.9 kg)  03/25/18 105 lb 3.2 oz (47.7 kg)  03/20/18 108 lb 3.9 oz (49.1 kg)  02/11/18 101 lb (45.8 kg)     Physical Exam Vitals signs and nursing note reviewed.  Constitutional:      General: She is not in acute distress.    Appearance: She is well-developed.  Cardiovascular:     Rate and Rhythm: Normal rate and regular rhythm.  Pulmonary:     Effort: Pulmonary effort is normal. No respiratory distress.     Breath sounds: Normal breath sounds. No wheezing or rhonchi.  Musculoskeletal:        General:  No swelling.  Neurological:     Mental Status: She is alert and oriented to person, place, and time.     Imaging: Dg Chest 2 View  Result Date: 04/17/2018 CLINICAL DATA:  Right pneumothorax. EXAM: CHEST - 2 VIEW COMPARISON:  03/26/2018, 03/21/2018 03/20/2018 FINDINGS: There has been further decrease in the small right apical pneumothorax with minimal residual. The heart size and vascularity are normal.  Aortic atherosclerosis. Unchanged chronic interstitial lung disease. No acute infiltrates or effusions. No acute bone abnormality. IMPRESSION: 1. Almost complete resolution of the small right apically pneumothorax. 2. Stable chronic interstitial lung disease. 3.  Aortic Atherosclerosis (ICD10-I70.0). Electronically Signed   By: Lorriane Shire M.D.   On: 04/17/2018 10:32   Dg Chest 2 View  Result Date: 03/26/2018 CLINICAL DATA:  Follow-up right pneumothorax EXAM: CHEST - 2 VIEW COMPARISON:  03/21/2018 FINDINGS: Cardiac shadow is stable. Aortic calcifications are again seen. Chronic interstitial changes are again identified throughout both lungs. Right apical pneumothorax is again noted with decrease in size when compared with the prior study. No new focal abnormality is noted. IMPRESSION: Improving but persistent right apical pneumothorax. Electronically Signed   By: Inez Catalina M.D.   On: 03/26/2018 15:56   Dg Chest 2 View  Result Date: 03/21/2018 CLINICAL DATA:  Shortness of breath.  Follow-up pneumothorax. EXAM: CHEST - 2 VIEW COMPARISON:  Chest radiograph March 20, 2018 and CT chest December 05, 2017. FINDINGS: Stable RIGHT pneumothorax measuring less than 1 cm from the chest wall; ipsilateral mediastinal shift. Diffuse interstitial prominence with nodular densities in airspace opacities RIGHT long. No pleural effusion. Stable cardiomegaly. Mildly calcified aortic arch. Osteopenia. IMPRESSION: 1. Stable small RIGHT pneumothorax. 2. Chronic interstitial lung disease with RIGHT upper lobe nodular  component and scarring/atelectasis. 3.  Aortic Atherosclerosis (ICD10-I70.0). Electronically Signed   By: Elon Alas M.D.   On: 03/21/2018 00:18   Dg Chest Port 1 View  Result Date: 03/21/2018 CLINICAL DATA:  Follow-up pneumothorax EXAM: PORTABLE CHEST 1 VIEW COMPARISON:  03/21/2018 FINDINGS: Stable right apical pneumothorax is noted. Diffuse interstitial disease is seen bilaterally stable from the prior exam. No new focal abnormality is seen. Cardiac shadow is stable. No bony abnormality is seen. IMPRESSION: Stable right pneumothorax. Stable interstitial changes. Electronically Signed   By: Linus Mako.D.  On: 03/21/2018 08:43     Assessment & Plan:   Pneumothorax Discussion: Patient is following up after hospitalization recently with small pneumothorax.  She was treated with supportive care.  She had a chest x-ray this morning which showed almost complete resolution.  She states that she has been doing well and has much more energy.  She is able to perform activities of daily living such as sweeping and cooking.  Patient Instructions  Flonase 2 puffs daily  Continue Zyrtec 10mg  1/2 At bedtime  daily for the next couple of weeks Fluids and rest  Continue current medications  Follow up: Follow up Dr. Chase Caller in  4 months Please contact office for sooner follow up if symptoms do not improve or worsen or seek emergency care        Fenton Foy, NP 04/17/2018

## 2018-04-17 NOTE — Progress Notes (Signed)
Pt given Cyanocobalamin inj Tolerated well 

## 2018-04-17 NOTE — Patient Instructions (Signed)
Flonase 2 puffs daily  Continue Zyrtec 10mg  1/2 At bedtime daily for the next couple of weeks Fluids and rest  Continue current medications  Follow up: Follow up Dr. Chase Caller in 4 months Please contact office for sooner follow up if symptoms do not improve or worsen or seek emergency care

## 2018-05-11 ENCOUNTER — Telehealth: Payer: Self-pay

## 2018-05-11 NOTE — Telephone Encounter (Signed)
IPF PRO Registry Purpose: To collect data and biological samples that will support future research studies.  Registry will describe current approaches to diagnosis and treatment of IPF, analyze participant characteristics to describe the natural history of the disease, assess quality of life, describe participants interactions with the health care system, describe IPF treatment practices across multiple institutions, and utilize biological samples linked to well characterized IPF participants to identify disease biomarkers    Clinical Research Coordinator note : This visit for Subject [name] with DOB: [birth year] on 05/11/2018 for the above protocol is Visit/Encounter # 18 month follow up  and is for purpose of research . The consent for this encounter is under Protocol Version August 12, 2016  and  is currently IRB approved. Subject expressed continued interest and consent in continuing as a study subject. Subject confirmed that there was  no change in contact information (e.g. address, telephone, email). Subject thanked for participation in research and contribution to science.   In this visit 05/11/2018 due to the current state of the Covid-19 pandemic, the subject refused to return to clinic for the patient reported outcomes, and blood collection. So for this visit the subject will only have data collection study assessments performed. The study coordinator collected new data in the subjects electronic medical record since last interval. The subject will return in six months for their next scheduled follow up interval. The subject stated if pandemic subsides would gladly return for blood collection and patient questionnaires.   Signed by  T. Early Chars BS, CCRC, Terry Coordinator  [place], [name] 2:[address] [place] 05/11/2018

## 2018-05-12 DIAGNOSIS — H04123 Dry eye syndrome of bilateral lacrimal glands: Secondary | ICD-10-CM | POA: Diagnosis not present

## 2018-05-18 ENCOUNTER — Encounter: Payer: Self-pay | Admitting: Family Medicine

## 2018-05-19 ENCOUNTER — Other Ambulatory Visit: Payer: Self-pay | Admitting: Family Medicine

## 2018-05-21 ENCOUNTER — Other Ambulatory Visit: Payer: Self-pay

## 2018-05-21 ENCOUNTER — Ambulatory Visit (INDEPENDENT_AMBULATORY_CARE_PROVIDER_SITE_OTHER): Payer: Medicare Other | Admitting: *Deleted

## 2018-05-21 DIAGNOSIS — E538 Deficiency of other specified B group vitamins: Secondary | ICD-10-CM

## 2018-05-21 NOTE — Progress Notes (Signed)
Pt given Cyanocobalamin inj Tolerated well 

## 2018-05-29 ENCOUNTER — Telehealth: Payer: Self-pay | Admitting: *Deleted

## 2018-05-29 NOTE — Telephone Encounter (Signed)
Triage can someone please contact patient's daughter about how we are proceeding with MR July f/u appts.  I am suppose to follow up with   Dr. Chase Caller in July and needing to schedule an appointment. Please call my daughter, [name] with this information or leave on mychart.  Thanks Ida Rogue

## 2018-05-29 NOTE — Telephone Encounter (Signed)
Spoke with patient's daughter. I advised her that MR's schedule is not yet available and we will call you as soon as the schedule becomes available. Also advised her to call us if she needed to be seen sooner. She verbalized understanding.   Nothing further needed at time of call.

## 2018-06-12 ENCOUNTER — Telehealth: Payer: Self-pay | Admitting: Cardiology

## 2018-06-12 NOTE — Telephone Encounter (Signed)
Virtual Visit Pre-Appointment Phone Call  "(Name), I am calling you today to discuss your upcoming appointment. We are currently trying to limit exposure to the virus that causes COVID-19 by seeing patients at home rather than in the office."  1. "What is the BEST phone number to call the day of the visit?" - include this in appointment notes  2. Do you have or have access to (through a family member/friend) a smartphone with video capability that we can use for your visit?" a. If yes - list this number in appt notes as cell (if different from BEST phone #) and list the appointment type as a VIDEO visit in appointment notes b. If no - list the appointment type as a PHONE visit in appointment notes  3. Confirm consent - "In the setting of the current Covid19 crisis, you are scheduled for a (phone or video) visit with your provider on (date) at (time).  Just as we do with many in-office visits, in order for you to participate in this visit, we must obtain consent.  If you'd like, I can send this to your mychart (if signed up) or email for you to review.  Otherwise, I can obtain your verbal consent now.  All virtual visits are billed to your insurance company just like a normal visit would be.  By agreeing to a virtual visit, we'd like you to understand that the technology does not allow for your provider to perform an examination, and thus may limit your provider's ability to fully assess your condition. If your provider identifies any concerns that need to be evaluated in person, we will make arrangements to do so.  Finally, though the technology is pretty good, we cannot assure that it will always work on either your or our end, and in the setting of a video visit, we may have to convert it to a phone-only visit.  In either situation, we cannot ensure that we have a secure connection.  Are you willing to proceed?" STAFF: Did the patient verbally acknowledge consent to telehealth visit? Document  YES/NO here: Yes  4. Advise patient to be prepared - "Two hours prior to your appointment, go ahead and check your blood pressure, pulse, oxygen saturation, and your weight (if you have the equipment to check those) and write them all down. When your visit starts, your provider will ask you for this information. If you have an Apple Watch or Kardia device, please plan to have heart rate information ready on the day of your appointment. Please have a pen and paper handy nearby the day of the visit as well."  5. Give patient instructions for MyChart download to smartphone OR [email or web address] as below if video visit (depending on what platform provider is using)  6. Inform patient they will receive a phone call 15 minutes prior to their appointment time (may be from unknown caller ID) so they should be prepared to answer    TELEPHONE CALL NOTE  [name] has been deemed a candidate for a follow-up tele-health visit to limit community exposure during the Covid-19 pandemic. I spoke with the patient via phone to ensure availability of phone/video source, confirm preferred email & phone number, and discuss instructions and expectations.  I reminded [name] to be prepared with any vital sign and/or heart rhythm information that could potentially be obtained via home monitoring, at the time of her visit. I reminded [name] to expect a phone call prior to  her visit.  Boyle L Goins 06/12/2018 10:24 AM

## 2018-06-15 ENCOUNTER — Encounter: Payer: Self-pay | Admitting: Family Medicine

## 2018-06-15 ENCOUNTER — Ambulatory Visit (INDEPENDENT_AMBULATORY_CARE_PROVIDER_SITE_OTHER): Payer: Medicare Other | Admitting: Family Medicine

## 2018-06-15 ENCOUNTER — Other Ambulatory Visit: Payer: Self-pay

## 2018-06-15 DIAGNOSIS — J849 Interstitial pulmonary disease, unspecified: Secondary | ICD-10-CM | POA: Diagnosis not present

## 2018-06-15 DIAGNOSIS — M48061 Spinal stenosis, lumbar region without neurogenic claudication: Secondary | ICD-10-CM | POA: Diagnosis not present

## 2018-06-15 DIAGNOSIS — F419 Anxiety disorder, unspecified: Secondary | ICD-10-CM

## 2018-06-15 DIAGNOSIS — J84112 Idiopathic pulmonary fibrosis: Secondary | ICD-10-CM

## 2018-06-15 DIAGNOSIS — E782 Mixed hyperlipidemia: Secondary | ICD-10-CM | POA: Diagnosis not present

## 2018-06-15 DIAGNOSIS — E559 Vitamin D deficiency, unspecified: Secondary | ICD-10-CM | POA: Diagnosis not present

## 2018-06-15 DIAGNOSIS — H5789 Other specified disorders of eye and adnexa: Secondary | ICD-10-CM | POA: Diagnosis not present

## 2018-06-15 DIAGNOSIS — E538 Deficiency of other specified B group vitamins: Secondary | ICD-10-CM

## 2018-06-15 DIAGNOSIS — Z1211 Encounter for screening for malignant neoplasm of colon: Secondary | ICD-10-CM

## 2018-06-15 NOTE — Addendum Note (Signed)
Addended by: Zannie Cove on: 06/15/2018 03:52 PM   Modules accepted: Orders

## 2018-06-15 NOTE — Patient Instructions (Addendum)
Continue to follow-up with pulmonology and Dr. Chase Caller Continue to follow-up with neurosurgery as needed Follow-up with cardiology as planned Discuss ongoing issues with eye irritation with ophthalmologist Continue to be careful and not put self at risk for falling and avoid climbing Continue to practice good social distancing and avoid crowds of people and practice good hand and respiratory hygiene Continue to drink plenty of fluids and stay well-hydrated We will place an order to get routine lab work at our office.  Please have someone bring you to the back door and arrange the time of this drawing at your convenience as well as the safest time for you to come based on what the nurse says.

## 2018-06-15 NOTE — Progress Notes (Signed)
Virtual Visit Via telephone Note I connected with@ on 06/15/18 by telephone and verified that I am speaking with the correct person or authorized healthcare agent using two identifiers. [name] is currently located at home and there are no unauthorized people in close proximity. I completed this visit while in a private location in my home .  This visit type was conducted due to national recommendations for restrictions regarding the COVID-19 Pandemic (e.g. social distancing).  This format is felt to be most appropriate for this patient at this time.  All issues noted in this document were discussed and addressed.  No physical exam was performed.    I discussed the limitations, risks, security and privacy concerns of performing an evaluation and management service by telephone and the availability of in person appointments. I also discussed with the patient that there may be a patient responsible charge related to this service. The patient expressed understanding and agreed to proceed.   Date:  06/15/2018    ID:  [name]      13-Apr-1925        [id number]   Patient Care Team Patient Care Team: Chipper Herb, MD as PCP - General (Family Medicine) Satira Sark, MD as PCP - Cardiology (Cardiology) Ashok Pall, MD as Consulting Physician (Neurosurgery) Daryll Brod, MD as Consulting Physician (Orthopedic Surgery) Brand Males, MD as Consulting Physician (Pulmonary Disease) Irine Seal, MD as Attending Physician (Urology) Melina Schools, OD (Optometry)  Reason for Visit: Primary Care Follow-up     History of Present Illness & Review of Systems:     [name] is a 85 y.o. year old female primary care patient that presents today for a telehealth visit.  Patient is pleasant and alert for being 85 years old.  She says she is feeling well and that her breathing is stable and it is worse when she gets up in the morning.  She is currently using her Brio inhaler in  the morning and her albuterol nebulizer at night but she knows if she can use the nebulizer more often if she needs it.  She denies any chest pain.  Her shortness of breath is stable.  She says that her swallowing is good and she has no nausea vomiting diarrhea blood in the stool or black tarry bowel movements.  She is passing her water well.  She is on antibiotic prophylaxis from the urologist and will continue to take this until she is able to follow-up with them.  She has an appointment with Dr. Domenic Polite that it is a virtual visit this Friday.  She is also been having some trouble with her according to her daughter and they are going to call him back up and let them know about this persistent irritation.  Review of systems as stated, otherwise negative.  The patient does not have symptoms concerning for COVID-19 infection (fever, chills, cough, or new shortness of breath).      Current Medications (Verified) Allergies as of 06/15/2018      Reactions   Levaquin [levaquin Leva-pak] Shortness Of Breath, Palpitations   Patient cannot remember that she is sensitive to this medication; has never heard of it.   Mucinex [guaifenesin Er] Other (See Comments)   Causes anxiety   Bentyl [dicyclomine Hcl] Other (See Comments)   shakes   Yellow Dyes (non-tartrazine) Other (See Comments)   unknown   Erythromycin Diarrhea   Penicillins Rash   Did it involve  swelling of the face/tongue/throat, SOB, or low BP? No Did it involve sudden or severe rash/hives, skin peeling, or any reaction on the inside of your mouth or nose? No Did you need to seek medical attention at a hospital or doctor's office? No When did it last happen? If all above answers are "NO", may proceed with cephalosporin use.      Medication List       Accurate as of June 15, 2018  1:46 PM. If you have any questions, ask your nurse or doctor.        albuterol (2.5 MG/3ML) 0.083% nebulizer solution Commonly known as:  PROVENTIL  NEBULIZE 1 VIAL EVERY 6 HOURS AS NEEDED What changed:  See the new instructions.   alendronate 70 MG tablet Commonly known as:  FOSAMAX TAKE 1 TABLET WEEKLY (TAKE WITH 8OZ OF WATER 30 MINUTES BEFORE BREAKFAST) What changed:  See the new instructions.   aspirin EC 81 MG tablet Take 1 tablet (81 mg total) by mouth daily.   Breo Ellipta 100-25 MCG/INH Aepb Generic drug:  fluticasone furoate-vilanterol USE 1 INHALATION DAILY   busPIRone 10 MG tablet Commonly known as:  BUSPAR Take 1 tablet (10 mg total) by mouth 3 (three) times daily.   calcium carbonate 600 MG Tabs tablet Commonly known as:  OS-CAL Take 600 mg by mouth daily with breakfast.   Centrum Silver tablet Take 1 tablet by mouth every evening.   cephALEXin 500 MG capsule Commonly known as:  KEFLEX Take 500 mg by mouth daily. Uses as suppressive therapy for bladder infections   cholecalciferol 1000 units tablet Commonly known as:  VITAMIN D Take 1,000 Units by mouth daily.   famotidine 20 MG tablet Commonly known as:  PEPCID Take 20 mg by mouth daily.   fluticasone 50 MCG/ACT nasal spray Commonly known as:  FLONASE USE 2 SPRAYS IN EACH NOSTRIL DAILY What changed:    how much to take  when to take this  reasons to take this   ibuprofen 200 MG tablet Commonly known as:  ADVIL Take 200 mg by mouth every 6 (six) hours as needed.   megestrol 40 MG tablet Commonly known as:  MEGACE TAKE 1/2 TABLET TWICE DAILY   nitroGLYCERIN 0.4 MG SL tablet Commonly known as:  NITROSTAT Place 1 tablet (0.4 mg total) under the tongue every 5 (five) minutes as needed for chest pain.   ROBITUSSIN DM PO Take by mouth 2 (two) times daily.   simvastatin 40 MG tablet Commonly known as:  ZOCOR TAKE ONE (1) TABLET EACH DAY           Allergies (Verified)    Levaquin [levaquin leva-pak]; Mucinex [guaifenesin er]; Bentyl [dicyclomine hcl]; Yellow dyes (non-tartrazine); Erythromycin; and Penicillins  Past Medical History  Past Medical History:  Diagnosis Date  . Arthritis   . Cataract   . Chronic back pain   . Glaucoma   . Hyperlipidemia   . Idiopathic pulmonary fibrosis ([place])   . Lumbar stenosis   . Osteoarthrosis and allied disorders   . Osteoporosis   . Peripheral neuropathy   . Vitamin D deficiency      Past Surgical History:  Procedure Laterality Date  . ABDOMINAL HYSTERECTOMY    . CATARACT EXTRACTION    . Cataract surgery Bilateral   . Eyelid surgery    . KNEE ARTHROSCOPY    . LUMBAR LAMINECTOMY      Social History   Socioeconomic History  . Marital status: Widowed  Spouse name: Not on file  . Number of children: Not on file  . Years of education: 5  . Highest education level: Not on file  Occupational History  . Not on file  Social Needs  . Financial resource strain: Not hard at all  . Food insecurity:    Worry: Never true    Inability: Never true  . Transportation needs:    Medical: No    Non-medical: No  Tobacco Use  . Smoking status: Never Smoker  . Smokeless tobacco: Never Used  Substance and Sexual Activity  . Alcohol use: No    Alcohol/week: 0.0 standard drinks  . Drug use: No  . Sexual activity: Not Currently    Birth control/protection: Post-menopausal  Lifestyle  . Physical activity:    Days per week: 7 days    Minutes per session: 30 min  . Stress: Not at all  Relationships  . Social connections:    Talks on phone: More than three times a week    Gets together: More than three times a week    Attends religious service: More than 4 times per year    Active member of club or organization: Yes    Attends meetings of clubs or organizations: More than 4 times per year    Relationship status: Widowed  Other Topics Concern  . Not on file  Social History Narrative  . Not on file     Family History  Problem Relation Age of Onset  . Bone cancer Mother   . Breast cancer Sister   . Diabetes Brother   . Asthma Daughter   . COPD Son   . Asthma Son        Labs/Other Tests and Data Reviewed:    Wt Readings from Last 3 Encounters:  04/17/18 106 lb (48.1 kg)  03/26/18 103 lb 6.4 oz (46.9 kg)  03/25/18 105 lb 3.2 oz (47.7 kg)   Temp Readings from Last 3 Encounters:  04/17/18 98.2 F (36.8 C)  03/26/18 99.4 F (37.4 C) (Oral)  03/25/18 97.7 F (36.5 C) (Oral)   BP Readings from Last 3 Encounters:  04/17/18 132/68  03/26/18 126/74  03/25/18 136/71   Pulse Readings from Last 3 Encounters:  04/17/18 74  03/26/18 85  03/25/18 76     Lab Results  Component Value Date   HGBA1C 5.2 10/24/2014   Lab Results  Component Value Date   LDLCALC 44 02/11/2018   CREATININE 0.72 03/21/2018       Chemistry      Component Value Date/Time   NA 139 03/21/2018 0304   NA 144 02/11/2018 1620   K 3.6 03/21/2018 0304   CL 105 03/21/2018 0304   CO2 26 03/21/2018 0304   BUN 14 03/21/2018 0304   BUN 9 (L) 02/11/2018 1620   CREATININE 0.72 03/21/2018 0304   CREATININE 0.67 07/16/2012 1151      Component Value Date/Time   CALCIUM 8.8 (L) 03/21/2018 0304   ALKPHOS 53 03/20/2018 2340   AST 31 03/20/2018 2340   ALT QUANTITY NOT SUFFICIENT, UNABLE TO PERFORM TEST 03/20/2018 2340   BILITOT QUANTITY NOT SUFFICIENT, UNABLE TO PERFORM TEST 03/20/2018 2340   BILITOT 0.2 02/11/2018 1620         OBSERVATIONS/ OBJECTIVE:     I spoke with the patient and her daughter was present during the visit.  The patient's weight is 106 and it is up some without any edema from 94 pounds in the past.  Her blood pressure is 117/65.  The patient's daughter has been staying with her on a regular basis and making sure that she is remaining stable.  She has follow-up visits planned with the cardiologist the urologist and the eye doctor.  She also has follow-up visits arranged with the pulmonologist.  Physical exam deferred due to nature of telephonic visit.  ASSESSMENT & PLAN    Time:   Today, I have spent 29 minutes with the patient via telephone  discussing the above including Covid precautions.     Visit Diagnoses: 1. B12 deficiency -Continue with monthly B12 injections -When patient comes to the office to get her B12 injection on Tuesday we will also get routine lab work to include CBC BMP LFTs vitamin D thyroid and FOBT.  2. Vitamin D deficiency -Check vitamin D level at next visit for B12 injection  3. Idiopathic pulmonary fibrosis ([place]) -Continue to follow-up with pulmonologist as recommended and follow his suggestions regarding this lung disease.  4. Interstitial lung disease ([place]) -Follow-up with pulmonology  5. Spinal stenosis of lumbar region without neurogenic claudication -Follow-up with neurosurgery as planned  6. Mixed hyperlipidemia -Continue with healthy diet and simvastatin pending results of lab work  7.  Eye irritation -Follow-up with ophthalmology as planned daughter will call and explain to him her mother's current ongoing condition with the eye irritation.  8.  Anxiety -Continue with low-dose BuSpar as currently doing and daughter says this is helping her.  Patient Instructions  Continue to follow-up with pulmonology and Dr. Chase Caller Continue to follow-up with neurosurgery as needed Follow-up with cardiology as planned Discuss ongoing issues with eye irritation with ophthalmologist Continue to be careful and not put self at risk for falling and avoid climbing Continue to practice good social distancing and avoid crowds of people and practice good hand and respiratory hygiene Continue to drink plenty of fluids and stay well-hydrated We will place an order to get routine lab work at our office.  Please have someone bring you to the back door and arrange the time of this drawing at your convenience as well as the safest time for you to come based on what the nurse says.     The above assessment and management plan was discussed with the patient. The patient verbalized understanding of and has agreed  to the management plan. Patient is aware to call the clinic if symptoms persist or worsen. Patient is aware when to return to the clinic for a follow-up visit. Patient educated on when it is appropriate to go to the emergency department.    Chipper Herb, MD [address], [place], [place] [address] Ph [phone]   Arrie Senate MD

## 2018-06-17 NOTE — Telephone Encounter (Signed)
Thank you for providing this information prior to her visit.  I will forward this to our nurse that we will be checking in on her in advance.

## 2018-06-19 ENCOUNTER — Encounter: Payer: Self-pay | Admitting: Cardiology

## 2018-06-19 ENCOUNTER — Telehealth (INDEPENDENT_AMBULATORY_CARE_PROVIDER_SITE_OTHER): Payer: Medicare Other | Admitting: Cardiology

## 2018-06-19 ENCOUNTER — Other Ambulatory Visit: Payer: Self-pay | Admitting: Family Medicine

## 2018-06-19 VITALS — Ht <= 58 in

## 2018-06-19 DIAGNOSIS — I2510 Atherosclerotic heart disease of native coronary artery without angina pectoris: Secondary | ICD-10-CM | POA: Diagnosis not present

## 2018-06-19 DIAGNOSIS — J849 Interstitial pulmonary disease, unspecified: Secondary | ICD-10-CM

## 2018-06-19 DIAGNOSIS — Z7189 Other specified counseling: Secondary | ICD-10-CM | POA: Diagnosis not present

## 2018-06-19 DIAGNOSIS — E782 Mixed hyperlipidemia: Secondary | ICD-10-CM

## 2018-06-19 DIAGNOSIS — J84112 Idiopathic pulmonary fibrosis: Secondary | ICD-10-CM

## 2018-06-19 MED ORDER — NITROGLYCERIN 0.4 MG SL SUBL: 0.4000 mg | SUBLINGUAL_TABLET | SUBLINGUAL | 3 refills | Status: AC | PRN

## 2018-06-19 NOTE — Patient Instructions (Addendum)

## 2018-06-19 NOTE — Progress Notes (Signed)
Virtual Visit via Telephone Note   This visit type was conducted due to national recommendations for restrictions regarding the COVID-19 Pandemic (e.g. social distancing) in an effort to limit this patient's exposure and mitigate transmission in our community.  Due to her co-morbid illnesses, this patient is at least at moderate risk for complications without adequate follow up.  This format is felt to be most appropriate for this patient at this time.  The patient did not have access to video technology/had technical difficulties with video requiring transitioning to audio format only (telephone).  All issues noted in this document were discussed and addressed.  No physical exam could be performed with this format.  Please refer to the patient's chart for her  consent to telehealth for [facility].   Date:  06/19/2018   ID:  [name], DOB 05-12-1925, MRN [id number]  Patient Location: Home Provider Location: Office  PCP:  Chipper Herb, MD  Cardiologist:  [name], MD Electrophysiologist:  None   Evaluation Performed:  Follow-Up Visit  Chief Complaint:   Cardiac follow-up  History of Present Illness:    [name] is a 85 y.o. female last seen in September 2019.  She did not have video access and we spoke by phone today.  She tells me that she has been staying around the house.  Her daughter gets groceries for her.  She does like to do some work in her yard, but her daughter has been planting her garden this year.  She enjoys working on [employer].  I reviewed her medications which are outlined below.  She needs a refill nitroglycerin.   I reviewed her most recent lab work, subsequent levels are pending per Dr. Laurance Flatten.  The patient does not have symptoms concerning for COVID-19 infection (fever, chills, cough, or new shortness of breath).    Past Medical History:  Diagnosis Date  . Arthritis   . Cataract   . Chronic back pain   . Glaucoma   . Hyperlipidemia    . Idiopathic pulmonary fibrosis ([place])   . Lumbar stenosis   . Osteoarthrosis and allied disorders   . Osteoporosis   . Peripheral neuropathy   . Vitamin D deficiency    Past Surgical History:  Procedure Laterality Date  . ABDOMINAL HYSTERECTOMY    . CATARACT EXTRACTION    . Cataract surgery Bilateral   . Eyelid surgery    . KNEE ARTHROSCOPY    . LUMBAR LAMINECTOMY       Current Meds  Medication Sig  . albuterol (PROVENTIL) (2.5 MG/3ML) 0.083% nebulizer solution NEBULIZE 1 VIAL EVERY 6 HOURS AS NEEDED (Patient taking differently: Take 2.5 mg by nebulization at bedtime. )  . alendronate (FOSAMAX) 70 MG tablet TAKE 1 TABLET WEEKLY (TAKE WITH 8OZ OF WATER 30 MINUTES BEFORE BREAKFAST) (Patient taking differently: Take 70 mg by mouth once a week. Monday)  . aspirin EC 81 MG tablet Take 1 tablet (81 mg total) by mouth daily.  Marland Kitchen BREO ELLIPTA 100-25 MCG/INH AEPB USE 1 INHALATION DAILY  . busPIRone (BUSPAR) 10 MG tablet Take 1 tablet (10 mg total) by mouth 3 (three) times daily.  . calcium carbonate (OS-CAL) 600 MG TABS tablet Take 600 mg by mouth daily with breakfast.  . cephALEXin (KEFLEX) 500 MG capsule Take 500 mg by mouth daily. Uses as suppressive therapy for bladder infections  . cholecalciferol (VITAMIN D) 1000 UNITS tablet Take 1,000 Units by mouth daily.  Marland Kitchen Dextromethorphan-guaiFENesin (ROBITUSSIN DM PO)  Take by mouth 2 (two) times daily.  . famotidine (PEPCID) 20 MG tablet Take 20 mg by mouth daily.  . fluticasone (FLONASE) 50 MCG/ACT nasal spray USE 2 SPRAYS IN EACH NOSTRIL DAILY (Patient taking differently: Place 1 spray into both nostrils as needed for allergies or rhinitis. )  . ibuprofen (ADVIL,MOTRIN) 200 MG tablet Take 200 mg by mouth every 6 (six) hours as needed.  . megestrol (MEGACE) 40 MG tablet TAKE 1/2 TABLET TWICE DAILY  . Multiple Vitamins-Minerals (CENTRUM SILVER) tablet Take 1 tablet by mouth every evening.  . nitroGLYCERIN (NITROSTAT) 0.4 MG SL tablet Place 1  tablet (0.4 mg total) under the tongue every 5 (five) minutes x 3 doses as needed for chest pain (if no relief after 3rd dose, proceed to the ED for an evaluation or call 911).  . simvastatin (ZOCOR) 40 MG tablet TAKE ONE (1) TABLET EACH DAY  . [DISCONTINUED] nitroGLYCERIN (NITROSTAT) 0.4 MG SL tablet Place 1 tablet (0.4 mg total) under the tongue every 5 (five) minutes as needed for chest pain.   Current Facility-Administered Medications for the 06/19/18 encounter (Telemedicine) with Satira Sark, MD  Medication  . cyanocobalamin ((VITAMIN B-12)) injection 1,000 mcg     Allergies:   Levaquin [levaquin leva-pak]; Mucinex [guaifenesin er]; Bentyl [dicyclomine hcl]; Yellow dyes (non-tartrazine); Erythromycin; and Penicillins   Social History   Tobacco Use  . Smoking status: Never Smoker  . Smokeless tobacco: Current User    Types: Chew  Substance Use Topics  . Alcohol use: No    Alcohol/week: 0.0 standard drinks  . Drug use: No     Family Hx: The patient's family history includes Asthma in her daughter and son; Bone cancer in her mother; Breast cancer in her sister; COPD in her son; Diabetes in her brother.  ROS:   Please see the history of present illness. All other systems reviewed and are negative.   Prior CV studies:   The following studies were reviewed today:  Chest CT 12/16/2016: IMPRESSION: 1. Fibrotic interstitial lung disease has progressed from 07/11/2010 and may be due to fibrotic nonspecific interstitial pneumonitis. Distribution is considered somewhat non typical for usual interstitial pneumonitis given areas of more central interstitial involvement as well as lack of a strong craniocaudal gradient, but is not excluded. 2. Aortic atherosclerosis (ICD10-170.0). Severe coronary artery calcification. 3. Enlarged pulmonary arteries, indicative of pulmonary arterial hypertension.  Labs/Other Tests and Data Reviewed:    EKG:  An ECG dated 03/20/2018 was  personally reviewed today and demonstrated:  Sinus rhythm with left bundle branch block.  Recent Labs: 03/20/2018: ALT QUANTITY NOT SUFFICIENT, UNABLE TO PERFORM TEST; B Natriuretic Peptide 575.3 03/21/2018: BUN 14; Creatinine, Ser 0.72; Hemoglobin 11.3; Platelets 227; Potassium 3.6; Sodium 139   Recent Lipid Panel Lab Results  Component Value Date/Time   CHOL 120 02/11/2018 04:20 PM   CHOL 158 07/16/2012 11:51 AM   TRIG 53 02/11/2018 04:20 PM   TRIG 105 06/22/2014 11:21 AM   TRIG 134 07/16/2012 11:51 AM   HDL 65 02/11/2018 04:20 PM   HDL 66 06/22/2014 11:21 AM   HDL 57 07/16/2012 11:51 AM   CHOLHDL 1.8 02/11/2018 04:20 PM   LDLCALC 44 02/11/2018 04:20 PM   LDLCALC 76 07/02/2013 11:15 AM   LDLCALC 74 07/16/2012 11:51 AM    Wt Readings from Last 3 Encounters:  04/17/18 106 lb (48.1 kg)  03/26/18 103 lb 6.4 oz (46.9 kg)  03/25/18 105 lb 3.2 oz (47.7 kg)  Objective:    Vital Signs:  Ht 4\' 10"  (1.473 m)   BMI 22.15 kg/m    132/68 - April 2020  Patient spoke in full sentences, not short of breath. No audible wheezing or coughing. Speech pattern normal.  ASSESSMENT & PLAN:    1.  Coronary artery calcification by chest CT imaging.  We will continue with medical therapy, she does not report any angina symptoms or nitroglycerin use.  Continue aspirin and Zocor.  Refill provided for fresh bottle of nitroglycerin.  2.  Idiopathic pulmonary fibrosis.  She continues to follow with Dr. Laurance Flatten and has had Pulmonary assessment as well.  3.  Mixed hyperlipidemia, continues on Zocor.  Follow-up lab work is pending.  Last LDL was 44.  COVID-19 Education: The signs and symptoms of COVID-19 were discussed with the patient and how to seek care for testing (follow up with PCP or arrange E-visit).  The importance of social distancing was discussed today.  Time:   Today, I have spent 5 minutes with the patient with telehealth technology discussing the above problems.     Medication  Adjustments/Labs and Tests Ordered: Current medicines are reviewed at length with the patient today.  Concerns regarding medicines are outlined above.   Tests Ordered: No orders of the defined types were placed in this encounter.   Medication Changes: Meds ordered this encounter  Medications  . nitroGLYCERIN (NITROSTAT) 0.4 MG SL tablet    Sig: Place 1 tablet (0.4 mg total) under the tongue every 5 (five) minutes x 3 doses as needed for chest pain (if no relief after 3rd dose, proceed to the ED for an evaluation or call 911).    Dispense:  25 tablet    Refill:  3    Disposition:  Follow up 6 months in the [place] office.  Signed, [name], MD  06/19/2018 2:55 PM    [place]

## 2018-06-22 ENCOUNTER — Other Ambulatory Visit: Payer: Self-pay

## 2018-06-22 ENCOUNTER — Ambulatory Visit (INDEPENDENT_AMBULATORY_CARE_PROVIDER_SITE_OTHER): Payer: Medicare Other | Admitting: *Deleted

## 2018-06-22 DIAGNOSIS — E559 Vitamin D deficiency, unspecified: Secondary | ICD-10-CM | POA: Diagnosis not present

## 2018-06-22 DIAGNOSIS — J849 Interstitial pulmonary disease, unspecified: Secondary | ICD-10-CM | POA: Diagnosis not present

## 2018-06-22 DIAGNOSIS — F419 Anxiety disorder, unspecified: Secondary | ICD-10-CM | POA: Diagnosis not present

## 2018-06-22 DIAGNOSIS — E782 Mixed hyperlipidemia: Secondary | ICD-10-CM | POA: Diagnosis not present

## 2018-06-22 DIAGNOSIS — H5789 Other specified disorders of eye and adnexa: Secondary | ICD-10-CM

## 2018-06-22 DIAGNOSIS — M48061 Spinal stenosis, lumbar region without neurogenic claudication: Secondary | ICD-10-CM

## 2018-06-22 DIAGNOSIS — J84112 Idiopathic pulmonary fibrosis: Secondary | ICD-10-CM

## 2018-06-22 DIAGNOSIS — E538 Deficiency of other specified B group vitamins: Secondary | ICD-10-CM | POA: Diagnosis not present

## 2018-06-22 NOTE — Progress Notes (Signed)
Pt given Cyanocobalamin inj Tolerated well 

## 2018-06-23 LAB — THYROID PANEL WITH TSH
Free Thyroxine Index: 2.3 (ref 1.2–4.9)
T3 Uptake Ratio: 25 % (ref 24–39)
T4, Total: 9.2 ug/dL (ref 4.5–12.0)
TSH: 0.588 u[IU]/mL (ref 0.450–4.500)

## 2018-06-23 LAB — CBC WITH DIFFERENTIAL/PLATELET
Basophils Absolute: 0.1 10*3/uL (ref 0.0–0.2)
Basos: 1 %
EOS (ABSOLUTE): 0.2 10*3/uL (ref 0.0–0.4)
Eos: 3 %
Hematocrit: 39.9 % (ref 34.0–46.6)
Hemoglobin: 12.8 g/dL (ref 11.1–15.9)
Immature Grans (Abs): 0 10*3/uL (ref 0.0–0.1)
Immature Granulocytes: 0 %
Lymphocytes Absolute: 1 10*3/uL (ref 0.7–3.1)
Lymphs: 17 %
MCH: 28.4 pg (ref 26.6–33.0)
MCHC: 32.1 g/dL (ref 31.5–35.7)
MCV: 89 fL (ref 79–97)
Monocytes Absolute: 0.8 10*3/uL (ref 0.1–0.9)
Monocytes: 13 %
Neutrophils Absolute: 4 10*3/uL (ref 1.4–7.0)
Neutrophils: 66 %
Platelets: 279 10*3/uL (ref 150–450)
RBC: 4.5 x10E6/uL (ref 3.77–5.28)
RDW: 12.5 % (ref 11.7–15.4)
WBC: 6 10*3/uL (ref 3.4–10.8)

## 2018-06-23 LAB — LIPID PANEL
Chol/HDL Ratio: 2.6 ratio (ref 0.0–4.4)
Cholesterol, Total: 125 mg/dL (ref 100–199)
HDL: 48 mg/dL (ref >39)
LDL Calculated: 64 mg/dL (ref 0–99)
Triglycerides: 66 mg/dL (ref 0–149)
VLDL Cholesterol Cal: 13 mg/dL (ref 5–40)

## 2018-06-23 LAB — BMP8+EGFR
BUN/Creatinine Ratio: 19 (ref 12–28)
BUN: 14 mg/dL (ref 10–36)
CO2: 26 mmol/L (ref 20–29)
Calcium: 9.4 mg/dL (ref 8.7–10.3)
Chloride: 104 mmol/L (ref 96–106)
Creatinine, Ser: 0.74 mg/dL (ref 0.57–1.00)
GFR calc Af Amer: 82 mL/min/{1.73_m2} (ref >59)
GFR calc non Af Amer: 72 mL/min/{1.73_m2} (ref >59)
Glucose: 133 mg/dL — ABNORMAL HIGH (ref 65–99)
Potassium: 4.9 mmol/L (ref 3.5–5.2)
Sodium: 144 mmol/L (ref 134–144)

## 2018-06-23 LAB — HEPATIC FUNCTION PANEL
ALT: 12 IU/L (ref 0–32)
AST: 25 IU/L (ref 0–40)
Albumin: 4.3 g/dL (ref 3.5–4.6)
Alkaline Phosphatase: 60 IU/L (ref 39–117)
Bilirubin Total: <0.2 mg/dL (ref 0.0–1.2)
Bilirubin, Direct: 0.1 mg/dL (ref 0.00–0.40)
Total Protein: 6.5 g/dL (ref 6.0–8.5)

## 2018-06-23 LAB — VITAMIN D 25 HYDROXY (VIT D DEFICIENCY, FRACTURES): Vit D, 25-Hydroxy: 57.7 ng/mL (ref 30.0–100.0)

## 2018-06-29 ENCOUNTER — Encounter: Payer: Self-pay | Admitting: Family Medicine

## 2018-07-21 ENCOUNTER — Other Ambulatory Visit: Payer: Self-pay

## 2018-07-22 ENCOUNTER — Ambulatory Visit (INDEPENDENT_AMBULATORY_CARE_PROVIDER_SITE_OTHER): Payer: Medicare Other | Admitting: *Deleted

## 2018-07-22 DIAGNOSIS — E538 Deficiency of other specified B group vitamins: Secondary | ICD-10-CM | POA: Diagnosis not present

## 2018-07-22 NOTE — Progress Notes (Signed)
Pt given Cyanocobalamin inj Tolerated well 

## 2018-07-24 ENCOUNTER — Other Ambulatory Visit: Payer: Self-pay | Admitting: Family Medicine

## 2018-07-24 DIAGNOSIS — F410 Panic disorder [episodic paroxysmal anxiety] without agoraphobia: Secondary | ICD-10-CM

## 2018-07-24 DIAGNOSIS — Z09 Encounter for follow-up examination after completed treatment for conditions other than malignant neoplasm: Secondary | ICD-10-CM

## 2018-07-24 DIAGNOSIS — F419 Anxiety disorder, unspecified: Secondary | ICD-10-CM

## 2018-07-24 DIAGNOSIS — J309 Allergic rhinitis, unspecified: Secondary | ICD-10-CM

## 2018-08-07 ENCOUNTER — Ambulatory Visit: Payer: Medicare Other | Admitting: Urology

## 2018-08-13 DIAGNOSIS — H1013 Acute atopic conjunctivitis, bilateral: Secondary | ICD-10-CM | POA: Diagnosis not present

## 2018-08-18 ENCOUNTER — Encounter: Payer: Self-pay | Admitting: Family Medicine

## 2018-08-21 ENCOUNTER — Other Ambulatory Visit: Payer: Self-pay | Admitting: Family Medicine

## 2018-08-21 ENCOUNTER — Other Ambulatory Visit: Payer: Self-pay

## 2018-08-24 ENCOUNTER — Other Ambulatory Visit: Payer: Self-pay

## 2018-08-24 ENCOUNTER — Ambulatory Visit (INDEPENDENT_AMBULATORY_CARE_PROVIDER_SITE_OTHER): Payer: Medicare Other

## 2018-08-24 DIAGNOSIS — E538 Deficiency of other specified B group vitamins: Secondary | ICD-10-CM

## 2018-08-24 NOTE — Progress Notes (Signed)
Patient given B12 injection and tolerated well.

## 2018-08-28 ENCOUNTER — Ambulatory Visit (INDEPENDENT_AMBULATORY_CARE_PROVIDER_SITE_OTHER): Payer: Medicare Other | Admitting: Urology

## 2018-08-28 ENCOUNTER — Other Ambulatory Visit: Payer: Self-pay

## 2018-08-28 DIAGNOSIS — N3020 Other chronic cystitis without hematuria: Secondary | ICD-10-CM | POA: Diagnosis not present

## 2018-08-28 DIAGNOSIS — N3946 Mixed incontinence: Secondary | ICD-10-CM | POA: Diagnosis not present

## 2018-09-04 ENCOUNTER — Other Ambulatory Visit: Payer: Self-pay

## 2018-09-04 ENCOUNTER — Ambulatory Visit (INDEPENDENT_AMBULATORY_CARE_PROVIDER_SITE_OTHER): Payer: Medicare Other | Admitting: Adult Health

## 2018-09-04 ENCOUNTER — Encounter: Payer: Self-pay | Admitting: Adult Health

## 2018-09-04 DIAGNOSIS — J209 Acute bronchitis, unspecified: Secondary | ICD-10-CM | POA: Diagnosis not present

## 2018-09-04 DIAGNOSIS — K219 Gastro-esophageal reflux disease without esophagitis: Secondary | ICD-10-CM

## 2018-09-04 DIAGNOSIS — J849 Interstitial pulmonary disease, unspecified: Secondary | ICD-10-CM

## 2018-09-04 DIAGNOSIS — I2510 Atherosclerotic heart disease of native coronary artery without angina pectoris: Secondary | ICD-10-CM | POA: Diagnosis not present

## 2018-09-04 NOTE — Assessment & Plan Note (Signed)
Appears stable -   Plan  Patient Instructions  Continue on BREO daily.  Rinse after use.  Continue on Zyrtec and Flonase .   Increase Famotidine 20mg  Twice daily   Gas x As needed   If stomach is not improving then will need to get checked out  Advance clear liquid diet as tolerated.   Follow up with Dr. Chase Caller in 6 months and As needed    Please contact office for sooner follow up if symptoms do not improve or worsen or seek emergency care

## 2018-09-04 NOTE — Patient Instructions (Addendum)
Continue on BREO daily.  Rinse after use.  Continue on Zyrtec and Flonase .   Increase Famotidine 20mg  Twice daily   Gas x As needed   If stomach is not improving then will need to get checked out  Advance clear liquid diet as tolerated.   Follow up with Dr. Chase Caller in 6 months and As needed    Please contact office for sooner follow up if symptoms do not improve or worsen or seek emergency care

## 2018-09-04 NOTE — Assessment & Plan Note (Signed)
?  GERD - Stomach upset ? Etiology  Seems to be improved with Pepto  May try pepcid Twice daily  ,  Advised to follow up with PCP  Please contact office for sooner follow up if symptoms do not improve or worsen or seek emergency care

## 2018-09-04 NOTE — Assessment & Plan Note (Addendum)
Chronic Bronchitis - BREO seems to help with this . Control for triggers Cont on current regimen

## 2018-09-04 NOTE — Progress Notes (Signed)
@Patient  ID: [name], female    DOB: [birth year], 85 y.o.   MRN: [id number]  Chief Complaint  Patient presents with  . Follow-up    ILD     Referring provider: Janora Norlander, DO  HPI: 85 year old female never smoker followed for interstitial lung disease, clinical diagnosis of IPF, lung nodules (stable 2012 2018) Medical history significant for chronic back pain, lumbar stenosis, osteoarthritis, peripheral neuropathy  TEST/EVENTS :  CT chest December 2017 bilateral interstitial reticulation with subpleural honeycombing with traction bronchiectasis compatible with chronic interstitial lung disease, stable pulmonary nodules,  Unable to complete  PFT   09/04/2018 Follow up ; ILD  Patient returns for a 87-month follow-up.  Patient has underlying interstitial lung disease.  She says overall breathing has been doing well . No flare of cough or dyspnea .  Has been active , canning at home .  She remains on Breo daily. Lives alone. Kids spend night with her.  Has rare dry cough .  Patient does have some chronic rhinitis.  Uses Flonase and Zyrtec. Earlier this year had small right pneumothroax. Follow up Chest xray 04/17/2018 showed near complete resolution on chest xray . Denies chest pain or increased dsypnea.   Has been having stomach issues , poor appetite . PCP started on megace. Has gained few pounds. This morning says burns her throat and stomach when she eats. Taking Pepto with some help. Does not burn as bad as before. No vomiting or fever.    Allergies  Allergen Reactions  . Levaquin [Levaquin Leva-Pak] Shortness Of Breath and Palpitations    Patient cannot remember that she is sensitive to this medication; has never heard of it.  . Mucinex [Guaifenesin Er] Other (See Comments)    Causes anxiety  . Bentyl [Dicyclomine Hcl] Other (See Comments)    shakes  . Yellow Dyes (Non-Tartrazine) Other (See Comments)    unknown  . Erythromycin Diarrhea  . Penicillins Rash   Did it involve swelling of the face/tongue/throat, SOB, or low BP? No Did it involve sudden or severe rash/hives, skin peeling, or any reaction on the inside of your mouth or nose? No Did you need to seek medical attention at a hospital or doctor's office? No When did it last happen? If all above answers are "NO", may proceed with cephalosporin use.    Immunization History  Administered Date(s) Administered  . Influenza Split 10/28/2012  . Influenza Whole 09/14/2009  . Influenza, High Dose Seasonal PF 10/07/2016, 11/11/2017  . Influenza, Quadrivalent, Recombinant, Inj, Pf 11/08/2015  . Influenza,inj,Quad PF,6+ Mos 10/24/2014  . Influenza,inj,quad, With Preservative 11/10/2013  . Pneumococcal Conjugate-13 12/31/2012  . Pneumococcal Polysaccharide-23 10/14/1996  . Td 10/15/2002  . Tdap 04/12/2010  . Zoster 02/05/2010    Past Medical History:  Diagnosis Date  . Arthritis   . Cataract   . Chronic back pain   . Glaucoma   . Hyperlipidemia   . Idiopathic pulmonary fibrosis ([place])   . Lumbar stenosis   . Osteoarthrosis and allied disorders   . Osteoporosis   . Peripheral neuropathy   . Vitamin D deficiency     Tobacco History: Social History   Tobacco Use  Smoking Status Never Smoker  Smokeless Tobacco Current User  . Types: Chew   Ready to quit: Not Answered Counseling given: Not Answered   Outpatient Medications Prior to Visit  Medication Sig Dispense Refill  . albuterol (PROVENTIL) (2.5 MG/3ML) 0.083% nebulizer solution NEBULIZE 1 VIAL EVERY 6 HOURS AS  NEEDED (Patient taking differently: Take 2.5 mg by nebulization at bedtime. ) 150 mL 5  . alendronate (FOSAMAX) 70 MG tablet TAKE 1 TABLET WEEKLY (TAKE WITH 8OZ OF WATER 30 MINUTES BEFORE BREAKFAST) (Patient taking differently: Take 70 mg by mouth once a week. Monday) 12 tablet 2  . aspirin EC 81 MG tablet Take 1 tablet (81 mg total) by mouth daily. 90 tablet 3  . BREO ELLIPTA 100-25 MCG/INH AEPB USE 1 INHALATION  DAILY 60 each 0  . busPIRone (BUSPAR) 10 MG tablet TAKE ONE (1) TABLET THREE (3) TIMES EACH DAY 270 tablet 1  . calcium carbonate (OS-CAL) 600 MG TABS tablet Take 600 mg by mouth daily with breakfast.    . cephALEXin (KEFLEX) 500 MG capsule Take 500 mg by mouth daily. Uses as suppressive therapy for bladder infections    . cetirizine (ZYRTEC) 10 MG chewable tablet Chew 10 mg by mouth daily. 1/2 tablet daily    . cholecalciferol (VITAMIN D) 1000 UNITS tablet Take 1,000 Units by mouth daily.    . famotidine (PEPCID) 20 MG tablet Take 20 mg by mouth daily.    . fluticasone (FLONASE) 50 MCG/ACT nasal spray USE 2 SPRAYS IN EACH NOSTRIL DAILY 16 g 11  . ibuprofen (ADVIL,MOTRIN) 200 MG tablet Take 200 mg by mouth every 6 (six) hours as needed.    . megestrol (MEGACE) 40 MG tablet TAKE 1/2 TABLET TWICE DAILY 90 tablet 0  . Multiple Vitamins-Minerals (CENTRUM SILVER) tablet Take 1 tablet by mouth every evening.    . nitroGLYCERIN (NITROSTAT) 0.4 MG SL tablet Place 1 tablet (0.4 mg total) under the tongue every 5 (five) minutes x 3 doses as needed for chest pain (if no relief after 3rd dose, proceed to the ED for an evaluation or call 911). 25 tablet 3  . olopatadine (PATADAY) 0.1 % ophthalmic solution Place 1 drop into both eyes 2 (two) times daily.    . simvastatin (ZOCOR) 40 MG tablet TAKE ONE (1) TABLET EACH DAY 90 tablet 0  . Dextromethorphan-guaiFENesin (ROBITUSSIN DM PO) Take by mouth 2 (two) times daily.     Facility-Administered Medications Prior to Visit  Medication Dose Route Frequency Provider Last Rate Last Dose  . cyanocobalamin ((VITAMIN B-12)) injection 1,000 mcg  1,000 mcg Intramuscular Q30 days Chipper Herb, MD   1,000 mcg at 08/24/18 1436     Review of Systems:   Constitutional:   No  weight loss, night sweats,  Fevers, chills,  +fatigue, or  lassitude.  HEENT:   No headaches,  Difficulty swallowing,  Tooth/dental problems, or  Sore throat,                No sneezing, itching,  ear ache, nasal congestion, post nasal drip,   CV:  No chest pain,  Orthopnea, PND, swelling in lower extremities, anasarca, dizziness, palpitations, syncope.   GI  No heartburn, indigestion, abdominal pain, nausea, vomiting, diarrhea, change in bowel habits, loss of appetite, bloody stools.   Resp .  No excess mucus, no productive cough,  No non-productive cough,  No coughing up of blood.  No change in color of mucus.  No wheezing.  No chest wall deformity  Skin: no rash or lesions.  GU: no dysuria, change in color of urine, no urgency or frequency.  No flank pain, no hematuria   MS:  No joint pain or swelling.  No decreased range of motion.  No back pain.    Physical Exam  BP 114/70  Pulse 69   Temp 98.1 F (36.7 C) (Oral)   Ht 4\' 10"  (1.473 m)   Wt 106 lb 6.4 oz (48.3 kg)   SpO2 96%   BMI 22.24 kg/m   GEN: A/Ox3; pleasant , NAD, frail and elderly , wc    HEENT:  [place]/AT,  EACs-clear, TMs-wnl, NOSE-clear, THROAT-clear, no lesions, no postnasal drip or exudate noted.   NECK:  Supple w/ fair ROM; no JVD; normal carotid impulses w/o bruits; no thyromegaly or nodules palpated; no lymphadenopathy.    RESP Faint BB crackles   no accessory muscle use, no dullness to percussion  CARD:  RRR, no m/r/g, no peripheral edema, pulses intact, no cyanosis or clubbing.  GI:   Soft & nt; nml bowel sounds; no organomegaly or masses detected.   Musco: Warm bil, no deformities or joint swelling noted.   Neuro: alert, no focal deficits noted.    Skin: Warm, no lesions or rashes    Lab Results:  CBC  BMET  No results found for: PROBNP  Imaging: No results found.  cyanocobalamin ((VITAMIN B-12)) injection 1,000 mcg    Date Action Dose Route User   08/24/2018 1436 Given 1000 mcg Intramuscular (Right Deltoid) Karle Plumber, RMA   07/22/2018 1650 Given 1000 mcg Intramuscular (Left Deltoid) Rutherford, Lanelle Bal K      No flowsheet data found.  No results found for:  NITRICOXIDE      Assessment & Plan:   Interstitial lung disease ([place]) Appears stable -   Plan  Patient Instructions  Continue on BREO daily.  Rinse after use.  Continue on Zyrtec and Flonase .   Increase Famotidine 20mg  Twice daily   Gas x As needed   If stomach is not improving then will need to get checked out  Advance clear liquid diet as tolerated.   Follow up with Dr. Chase Caller in 6 months and As needed    Please contact office for sooner follow up if symptoms do not improve or worsen or seek emergency care        Acute bronchitis Chronic Bronchitis - BREO seems to help with this  Cont on current regimen    GERD (gastroesophageal reflux disease) ?GERD - Stomach upset ? Etiology  Seems to be improved with Pepto  May try pepcid Twice daily  ,  Advised to follow up with PCP  Please contact office for sooner follow up if symptoms do not improve or worsen or seek emergency care       Rexene Edison, NP 09/04/2018

## 2018-09-05 DIAGNOSIS — K219 Gastro-esophageal reflux disease without esophagitis: Secondary | ICD-10-CM | POA: Diagnosis not present

## 2018-09-15 ENCOUNTER — Other Ambulatory Visit: Payer: Self-pay | Admitting: Family Medicine

## 2018-09-17 ENCOUNTER — Encounter: Payer: Self-pay | Admitting: Family Medicine

## 2018-09-23 ENCOUNTER — Ambulatory Visit (INDEPENDENT_AMBULATORY_CARE_PROVIDER_SITE_OTHER): Payer: Medicare Other

## 2018-09-23 ENCOUNTER — Other Ambulatory Visit: Payer: Self-pay

## 2018-09-23 DIAGNOSIS — E538 Deficiency of other specified B group vitamins: Secondary | ICD-10-CM

## 2018-09-23 NOTE — Progress Notes (Signed)
Patient given Cyanocobalamin to left arm. Tolerated well

## 2018-10-13 ENCOUNTER — Ambulatory Visit (INDEPENDENT_AMBULATORY_CARE_PROVIDER_SITE_OTHER): Payer: Medicare Other | Admitting: Adult Health

## 2018-10-13 ENCOUNTER — Encounter: Payer: Self-pay | Admitting: Adult Health

## 2018-10-13 ENCOUNTER — Other Ambulatory Visit: Payer: Self-pay

## 2018-10-13 DIAGNOSIS — J849 Interstitial pulmonary disease, unspecified: Secondary | ICD-10-CM | POA: Diagnosis not present

## 2018-10-13 DIAGNOSIS — J069 Acute upper respiratory infection, unspecified: Secondary | ICD-10-CM | POA: Diagnosis not present

## 2018-10-13 MED ORDER — PREDNISONE 10 MG PO TABS: ORAL_TABLET | ORAL | 0 refills | Status: DC

## 2018-10-13 NOTE — Progress Notes (Signed)
Virtual Visit via Telephone Note  I connected with LETRICIA SOBIERAJ on 10/13/18 at  1:30 PM EDT by telephone and verified that I am speaking with the correct person using two identifiers.  Location: Patient: Home  Provider: Office    I discussed the limitations, risks, security and privacy concerns of performing an evaluation and management service by telephone and the availability of in person appointments. I also discussed with the patient that there may be a patient responsible charge related to this service. The patient expressed understanding and agreed to proceed.   History of Present Illness: 85 yo female never smoker followed for interstitial lung disease, clinical diagnosis of IPF, lung nodules (stable 2012 and 2018) Medical history significant for chronic back pain, lumbar stenosis, osteoarthritis, peripheral neuropathy   Today's televisit is for an acute office visit for dyspnea and allergies.  She is accompanied by her daughter. Patient complains over the last 2 weeks breathing has not been quite as good as it has been.  She really does not have an increased cough or congestion does have what she describes as allergy symptoms with a little bit more drainage and breathing is more labored at times.  Denies any fever, orthopnea chest pain or edema. She remains on Breo daily.  Patient says she just wants to get her breathing better so she can get back to her light housework wants to do her own stuff and not have people do it for her. Appetite is improved.   She has been on Megace for a few months. She denies any hemoptysis calf pain or chest pain.    Observations/Objective: CT chest December 2017 bilateral interstitial reticulation with subpleural honeycombing with traction bronchiectasis compatible with chronic interstitial lung disease, stable pulmonary nodules,  Unable to complete  PFT    Assessment and Plan: ILD with mild URI.  Will treat symptomatic.  She has no fever no  discolored mucus.  We will give a short course of low-dose steroids.  Continue on symptomatic treatment with Flonase and Zyrtec and Delsym.  If symptoms worsen patient and family know to call sooner.  AR -continue on Zyrtec and Flonase.  Plan  Patient Instructions  Delsym 2 tsp Twice daily  As needed  Cough. Begin Prednisone 20mg  daily for 3 days .  Saline nasal rinses As needed   Flonase 2 puffs daily .  Zyrtec 10mg  1/2 At bedtime   Fluids and rest  Follow up Dr. Chase Caller or Parrett NP in 3-4 months and As needed   Please contact office for sooner follow up if symptoms do not improve or worsen or seek emergency care       Follow Up Instructions: Follow-up in 3 to 4 months and as needed  Please contact office for sooner follow up if symptoms do not improve or worsen or seek emergency care     I discussed the assessment and treatment plan with the patient. The patient was provided an opportunity to ask questions and all were answered. The patient agreed with the plan and demonstrated an understanding of the instructions.   The patient was advised to call back or seek an in-person evaluation if the symptoms worsen or if the condition fails to improve as anticipated.  I provided  21  minutes of non-face-to-face time during this encounter.   Rexene Edison, NP

## 2018-10-13 NOTE — Patient Instructions (Addendum)
Delsym 2 tsp Twice daily  As needed  Cough. Begin Prednisone 20mg  daily for 3 days .  Saline nasal rinses As needed   Flonase 2 puffs daily .  Zyrtec 10mg  1/2 At bedtime   Fluids and rest  Follow up Dr. Chase Caller or Parrett NP in 3-4 months and As needed   Please contact office for sooner follow up if symptoms do not improve or worsen or seek emergency care

## 2018-10-15 ENCOUNTER — Other Ambulatory Visit: Payer: Self-pay

## 2018-10-15 ENCOUNTER — Telehealth: Payer: Self-pay | Admitting: Family Medicine

## 2018-10-15 NOTE — Telephone Encounter (Signed)
Patient answers yes to the covid question for shortness of breath.  This has been ongoing for several months and is not a new symptom.  Please advise patient if she will be able to come in to the office for appointment tomorrow with Dr. Lajuana Ripple at 9:30 am.

## 2018-10-16 ENCOUNTER — Encounter: Payer: Self-pay | Admitting: Family Medicine

## 2018-10-16 ENCOUNTER — Ambulatory Visit (HOSPITAL_COMMUNITY)
Admission: RE | Admit: 2018-10-16 | Discharge: 2018-10-16 | Disposition: A | Discharge status: Home / Self Care | Payer: Medicare Other | Source: Ambulatory Visit | Attending: Family Medicine | Admitting: Family Medicine

## 2018-10-16 ENCOUNTER — Ambulatory Visit (INDEPENDENT_AMBULATORY_CARE_PROVIDER_SITE_OTHER): Payer: Medicare Other | Admitting: Family Medicine

## 2018-10-16 ENCOUNTER — Other Ambulatory Visit: Payer: Self-pay | Admitting: *Deleted

## 2018-10-16 DIAGNOSIS — R0602 Shortness of breath: Secondary | ICD-10-CM | POA: Diagnosis not present

## 2018-10-16 DIAGNOSIS — Z7189 Other specified counseling: Secondary | ICD-10-CM

## 2018-10-16 DIAGNOSIS — Z8709 Personal history of other diseases of the respiratory system: Secondary | ICD-10-CM | POA: Insufficient documentation

## 2018-10-16 DIAGNOSIS — Z20822 Contact with and (suspected) exposure to covid-19: Secondary | ICD-10-CM

## 2018-10-16 DIAGNOSIS — J84112 Idiopathic pulmonary fibrosis: Secondary | ICD-10-CM

## 2018-10-16 NOTE — Patient Instructions (Signed)
Prevent the Spread of COVID-19 if You Are Sick If you are sick with COVID-19 or think you might have COVID-19, follow the steps below to help protect other people in your home and community. Stay home except to get medical care.  Stay home. Most people with COVID-19 have mild illness and are able to recover at home without medical care. Do not leave your home, except to get medical care. Do not visit public areas.  Take care of yourself. Get rest and stay hydrated.  Get medical care when needed. Call your doctor before you go to their office for care. But, if you have trouble breathing or other concerning symptoms, call 911 for immediate help.  Avoid public transportation, ride-sharing, or taxis. Separate yourself from other people and pets in your home.  As much as possible, stay in a specific room and away from other people and pets in your home. Also, you should use a separate bathroom, if available. If you need to be around other people or animals in or outside of the home, wear a cloth face covering. ? See COVID-19 and Animals if you have questions about pets: https://www.cdc.gov/coronavirus/2019-ncov/faq.html#COVID19animals Monitor your symptoms.  Common symptoms of COVID-19 include fever and cough. Trouble breathing is a more serious symptom that means you should get medical attention.  Follow care instructions from your healthcare provider and local health department. Your local health authorities will give instructions on checking your symptoms and reporting information. If you develop emergency warning signs for COVID-19 get medical attention immediately.  Emergency warning signs include*:  Trouble breathing  Persistent pain or pressure in the chest  New confusion or not able to be woken  Bluish lips or face *This list is not all inclusive. Please consult your medical provider for any other symptoms that are severe or concerning to you. Call 911 if you have a medical  emergency. If you have a medical emergency and need to call 911, notify the operator that you have or think you might have, COVID-19. If possible, put on a facemask before medical help arrives. Call ahead before visiting your doctor.  Call ahead. Many medical visits for routine care are being postponed or done by phone or telemedicine.  If you have a medical appointment that cannot be postponed, call your doctor's office. This will help the office protect themselves and other patients. If you are sick, wear a cloth covering over your nose and mouth.  You should wear a cloth face covering over your nose and mouth if you must be around other people or animals, including pets (even at home).  You don't need to wear the cloth face covering if you are alone. If you can't put on a cloth face covering (because of trouble breathing for example), cover your coughs and sneezes in some other way. Try to stay at least 6 feet away from other people. This will help protect the people around you. Note: During the COVID-19 pandemic, medical grade facemasks are reserved for healthcare workers and some first responders. You may need to make a cloth face covering using a scarf or bandana. Cover your coughs and sneezes.  Cover your mouth and nose with a tissue when you cough or sneeze.  Throw used tissues in a lined trash can.  Immediately wash your hands with soap and water for at least 20 seconds. If soap and water are not available, clean your hands with an alcohol-based hand sanitizer that contains at least 60% alcohol. Clean your hands often.    Wash your hands often with soap and water for at least 20 seconds. This is especially important after blowing your nose, coughing, or sneezing; going to the bathroom; and before eating or preparing food.  Use hand sanitizer if soap and water are not available. Use an alcohol-based hand sanitizer with at least 60% alcohol, covering all surfaces of your hands and rubbing  them together until they feel dry.  Soap and water are the best option, especially if your hands are visibly dirty.  Avoid touching your eyes, nose, and mouth with unwashed hands. Avoid sharing personal household items.  Do not share dishes, drinking glasses, cups, eating utensils, towels, or bedding with other people in your home.  Wash these items thoroughly after using them with soap and water or put them in the dishwasher. Clean all "high-touch" surfaces everyday.  Clean and disinfect high-touch surfaces in your "sick room" and bathroom. Let someone else clean and disinfect surfaces in common areas, but not your bedroom and bathroom.  If a caregiver or other person needs to clean and disinfect a sick person's bedroom or bathroom, they should do so on an as-needed basis. The caregiver/other person should wear a mask and wait as long as possible after the sick person has used the bathroom. High-touch surfaces include phones, remote controls, counters, tabletops, doorknobs, bathroom fixtures, toilets, keyboards, tablets, and bedside tables.  Clean and disinfect areas that may have blood, stool, or body fluids on them.  Use household cleaners and disinfectants. Clean the area or item with soap and water or another detergent if it is dirty. Then use a household disinfectant. ? Be sure to follow the instructions on the label to ensure safe and effective use of the product. Many products recommend keeping the surface wet for several minutes to ensure germs are killed. Many also recommend precautions such as wearing gloves and making sure you have good ventilation during use of the product. ? Most EPA-registered household disinfectants should be effective. How to discontinue home isolation  People with COVID-19 who have stayed home (home isolated) can stop home isolation under the following conditions: ? If you will not have a test to determine if you are still contagious, you can leave home  after these three things have happened:  You have had no fever for at least 72 hours (that is three full days of no fever without the use of medicine that reduces fevers) AND  other symptoms have improved (for example, when your cough or shortness of breath has improved) AND  at least 10 days have passed since your symptoms first appeared. ? If you will be tested to determine if you are still contagious, you can leave home after these three things have happened:  You no longer have a fever (without the use of medicine that reduces fevers) AND  other symptoms have improved (for example, when your cough or shortness of breath has improved) AND  you received two negative tests in a row, 24 hours apart. Your doctor will follow CDC guidelines. In all cases, follow the guidance of your healthcare provider and local health department. The decision to stop home isolation should be made in consultation with your healthcare provider and state and local health departments. Local decisions depend on local circumstances. cdc.gov/coronavirus 05/17/2018 This information is not intended to replace advice given to you by your health care provider. Make sure you discuss any questions you have with your health care provider. Document Released: 04/28/2018 Document Revised: 05/27/2018 Document Reviewed: 04/28/2018   [employer]vier Patient Education  2020 [employer]  

## 2018-10-16 NOTE — Progress Notes (Signed)
Telephone visit  Subjective: CC: SOB PCP: Cathy Norlander, DO [phone] [name] is a 85 y.o. female calls for telephone consult today. Patient provides verbal consent for consult held via phone.  Location of patient: home Location of provider: Working remotely from home Others present for call: [name], daughter  1. Cough, shortness of breath History is provided by her daughter.  She has had a dry cough over the last few weeks that onset with change in season.  She was prescribed prednisone burst which she completed yesterday, and symptoms are getting better but not resolved.  She still has shortness of breath.  Her daughter wonders if this is related to trying to get the a temperature adjusted in the home versus progression of her underlying lung disease versus infection versus pneumothorax.  No fever, chills, rhinorrhea.  She has a history of pneumothorax.  Compliant with Breo in AM and albuterol in PM.  She takes zyrtec, flonase.  No known sick contacts.  Patient has been isolating at home and only has gone out a couple of times a church.  Her daughter works for the health department and has been caring for her since she has been sick.   ROS: Per HPI  Allergies  Allergen Reactions  . Levaquin [Levaquin Leva-Pak] Shortness Of Breath and Palpitations    Patient cannot remember that she is sensitive to this medication; has never heard of it.  . Mucinex [Guaifenesin Er] Other (See Comments)    Causes anxiety  . Bentyl [Dicyclomine Hcl] Other (See Comments)    shakes  . Yellow Dyes (Non-Tartrazine) Other (See Comments)    unknown  . Erythromycin Diarrhea  . Penicillins Rash    Did it involve swelling of the face/tongue/throat, SOB, or low BP? No Did it involve sudden or severe rash/hives, skin peeling, or any reaction on the inside of your mouth or [name]? No Did you need to seek medical attention at a hospital or doctor's office? No When did it last happen? If all above  answers are "NO", may proceed with cephalosporin use.   Past Medical History:  Diagnosis Date  . Arthritis   . Cataract   . Chronic back pain   . Glaucoma   . Hyperlipidemia   . Idiopathic pulmonary fibrosis ([place])   . Lumbar stenosis   . Osteoarthrosis and allied disorders   . Osteoporosis   . Peripheral neuropathy   . Vitamin D deficiency     Current Outpatient Medications:  .  albuterol (PROVENTIL) (2.5 MG/3ML) 0.083% nebulizer solution, NEBULIZE 1 VIAL EVERY 6 HOURS AS NEEDED (Patient taking differently: Take 2.5 mg by nebulization at bedtime. ), Disp: 150 mL, Rfl: 5 .  alendronate (FOSAMAX) 70 MG tablet, TAKE 1 TABLET WEEKLY (TAKE WITH 8OZ OF WATER 30 MINUTES BEFORE BREAKFAST) (Patient taking differently: Take 70 mg by mouth once a week. Monday), Disp: 12 tablet, Rfl: 2 .  aspirin EC 81 MG tablet, Take 1 tablet (81 mg total) by mouth daily., Disp: 90 tablet, Rfl: 3 .  BREO ELLIPTA 100-25 MCG/INH AEPB, USE 1 INHALATION DAILY, Disp: 60 each, Rfl: 2 .  busPIRone (BUSPAR) 10 MG tablet, TAKE ONE (1) TABLET THREE (3) TIMES EACH DAY, Disp: 270 tablet, Rfl: 1 .  calcium carbonate (OS-CAL) 600 MG TABS tablet, Take 600 mg by mouth daily with breakfast., Disp: , Rfl:  .  cephALEXin (KEFLEX) 500 MG capsule, Take 500 mg by mouth daily. Uses as suppressive therapy for bladder infections, Disp: , Rfl:  .  cetirizine (ZYRTEC) 10 MG chewable tablet, 1/2 tablet daily , Disp: , Rfl:  .  cholecalciferol (VITAMIN D) 1000 UNITS tablet, Take 1,000 Units by mouth daily., Disp: , Rfl:  .  famotidine (PEPCID) 20 MG tablet, Take 20 mg by mouth daily., Disp: , Rfl:  .  fluticasone (FLONASE) 50 MCG/ACT nasal spray, USE 2 SPRAYS IN EACH NOSTRIL DAILY, Disp: 16 g, Rfl: 11 .  ibuprofen (ADVIL,MOTRIN) 200 MG tablet, Take 200 mg by mouth every 6 (six) hours as needed., Disp: , Rfl:  .  megestrol (MEGACE) 40 MG tablet, TAKE 1/2 TABLET TWICE DAILY, Disp: 90 tablet, Rfl: 0 .  Multiple Vitamins-Minerals (CENTRUM  SILVER) tablet, Take 1 tablet by mouth every evening., Disp: , Rfl:  .  nitroGLYCERIN (NITROSTAT) 0.4 MG SL tablet, Place 1 tablet (0.4 mg total) under the tongue every 5 (five) minutes x 3 doses as needed for chest pain (if no relief after 3rd dose, proceed to the ED for an evaluation or call 911)., Disp: 25 tablet, Rfl: 3 .  olopatadine (PATADAY) 0.1 % ophthalmic solution, Place 1 drop into both eyes daily. , Disp: , Rfl:  .  predniSONE (DELTASONE) 10 MG tablet, 2 tabs daily for 3 days, Disp: 6 tablet, Rfl: 0 .  simvastatin (ZOCOR) 40 MG tablet, TAKE ONE (1) TABLET EACH DAY, Disp: 90 tablet, Rfl: 0  Current Facility-Administered Medications:  .  cyanocobalamin ((VITAMIN B-12)) injection 1,000 mcg, 1,000 mcg, Intramuscular, Q30 days, Chipper Herb, MD, 1,000 mcg at 09/23/18 1150  Assessment/ Plan: 85 y.o. female   1. Shortness of breath Uncertain etiology at this time.  She did respond some to the prednisone burst.  She has known ILD.  Nothing infectious sounding at this point but will certainly rule out pneumothorax given history.  They will go to any [facility] to have x-ray obtained and I will contact him with results and recommendations once available.  Also plan to check COVID-19 lab.  No known exposures.  Her daughter works at the [facility]; Future - Novel Coronavirus, NAA ([facility])  2. Advice given about COVID-19 virus by telephone - [facility]; Future - Novel Coronavirus, NAA ([facility])  3. IPF (idiopathic pulmonary fibrosis) (HCC) Continue inhalers as recommended by her pulmonologist - [facility]; Future - Novel Coronavirus, NAA ([facility])  4. History of pneumothorax - [facility]; Future - Novel Coronavirus, NAA ([facility])   Start time: 9:40am End time: 9:58am  Total time spent on patient care (including telephone call/ virtual visit): 25 minutes  [place], [place] [phone]

## 2018-10-16 NOTE — Telephone Encounter (Signed)
Changed to televisit

## 2018-10-17 LAB — NOVEL CORONAVIRUS, NAA: SARS-CoV-2, NAA: NOT DETECTED

## 2018-10-20 ENCOUNTER — Other Ambulatory Visit: Payer: Self-pay

## 2018-10-20 ENCOUNTER — Ambulatory Visit (INDEPENDENT_AMBULATORY_CARE_PROVIDER_SITE_OTHER): Payer: Medicare Other | Admitting: Adult Health

## 2018-10-20 ENCOUNTER — Encounter: Payer: Self-pay | Admitting: Adult Health

## 2018-10-20 VITALS — BP 114/70 | HR 70 | Temp 97.5°F | Ht <= 58 in | Wt 109.2 lb

## 2018-10-20 DIAGNOSIS — J849 Interstitial pulmonary disease, unspecified: Secondary | ICD-10-CM | POA: Diagnosis not present

## 2018-10-20 DIAGNOSIS — R0789 Other chest pain: Secondary | ICD-10-CM | POA: Diagnosis not present

## 2018-10-20 DIAGNOSIS — R0600 Dyspnea, unspecified: Secondary | ICD-10-CM | POA: Insufficient documentation

## 2018-10-20 DIAGNOSIS — R634 Abnormal weight loss: Secondary | ICD-10-CM | POA: Diagnosis not present

## 2018-10-20 DIAGNOSIS — R0602 Shortness of breath: Secondary | ICD-10-CM | POA: Diagnosis not present

## 2018-10-20 DIAGNOSIS — I2510 Atherosclerotic heart disease of native coronary artery without angina pectoris: Secondary | ICD-10-CM

## 2018-10-20 DIAGNOSIS — R0609 Other forms of dyspnea: Secondary | ICD-10-CM

## 2018-10-20 DIAGNOSIS — Z5181 Encounter for therapeutic drug level monitoring: Secondary | ICD-10-CM

## 2018-10-20 NOTE — Progress Notes (Signed)
@Patient  ID: [name], female    DOB: June 22, 1925, 85 y.o.   MRN: [id number]  Chief Complaint  Patient presents with  . ILD    Referring provider: Janora Norlander, DO  HPI: 85 year old female never smoker followed for interstitial lung disease clinical diagnosis of IPF, lung nodules (stable 2012 2018) Early 2020 with small right pneumothorax with conservative treatment and serial follow-up showed near complete resolution on chest x-ray 04/17/2018  TEST/EVENTS :   CT chest December 2017 bilateral interstitial reticulation with subpleural honeycombing with traction bronchiectasis compatible with chronic interstitial lung disease, stable pulmonary nodules,  Unable to do pulmonary function testing in the past  10/20/2018 Follow up : ILD  Patient returns for a one-week follow-up.  Patient was seen for a tele-visit on October 13, 2018 with 2 weeks of mild cough and congestion along with increased shortness of breath and decreased activity tolerance.  She was given prednisone 20 mg daily for 3 days.  Recommend to use Delsym as needed.  Patient symptoms did not improve.  She was seen by her primary care provider.  Chest x-ray showed a tiny right apical pneumothorax that was smaller than previous chest x-ray in April 2020.  Chronic interstitial changes. COVID-19 testing was negative.  Lab work in June 2020 showed improved anemia with normal hemoglobin and white blood cell count.  electrolyte panel was normal Since last visit patient is feeling about the same. Cough is some better. Shortness of breath with activity is the same. Feels over last couple years gets more winded with activity and seems to be slowly getting worse , also not able to do as much . Wears out easier.  Weight is stable. No leg swelling , no orthopnea.  O2 sats 100% on room air at rest. ,walk test O2 sats walking 93% on room air.  Still does light housework and cooking .   Allergies  Allergen Reactions  . Levaquin  [Levaquin Leva-Pak] Shortness Of Breath and Palpitations    Patient cannot remember that she is sensitive to this medication; has never heard of it.  . Mucinex [Guaifenesin Er] Other (See Comments)    Causes anxiety  . Bentyl [Dicyclomine Hcl] Other (See Comments)    shakes  . Yellow Dyes (Non-Tartrazine) Other (See Comments)    unknown  . Erythromycin Diarrhea  . Penicillins Rash    Did it involve swelling of the face/tongue/throat, SOB, or low BP? No Did it involve sudden or severe rash/hives, skin peeling, or any reaction on the inside of your mouth or nose? No Did you need to seek medical attention at a hospital or doctor's office? No When did it last happen? If all above answers are "NO", may proceed with cephalosporin use.    Immunization History  Administered Date(s) Administered  . Influenza Split 10/28/2012  . Influenza Whole 09/14/2009  . Influenza, High Dose Seasonal PF 10/07/2016, 11/11/2017  . Influenza, Quadrivalent, Recombinant, Inj, Pf 11/08/2015  . Influenza,inj,Quad PF,6+ Mos 10/24/2014  . Influenza,inj,quad, With Preservative 11/10/2013  . Pneumococcal Conjugate-13 12/31/2012  . Pneumococcal Polysaccharide-23 10/14/1996  . Td 10/15/2002  . Tdap 04/12/2010  . Zoster 02/05/2010    Past Medical History:  Diagnosis Date  . Arthritis   . Cataract   . Chronic back pain   . Glaucoma   . Hyperlipidemia   . Idiopathic pulmonary fibrosis ([place])   . Lumbar stenosis   . Osteoarthrosis and allied disorders   . Osteoporosis   . Peripheral neuropathy   .  Vitamin D deficiency     Tobacco History: Social History   Tobacco Use  Smoking Status Never Smoker  Smokeless Tobacco Current User  . Types: Chew   Ready to quit: Not Answered Counseling given: Not Answered   Outpatient Medications Prior to Visit  Medication Sig Dispense Refill  . albuterol (PROVENTIL) (2.5 MG/3ML) 0.083% nebulizer solution NEBULIZE 1 VIAL EVERY 6 HOURS AS NEEDED (Patient taking  differently: Take 2.5 mg by nebulization at bedtime. ) 150 mL 5  . alendronate (FOSAMAX) 70 MG tablet TAKE 1 TABLET WEEKLY (TAKE WITH 8OZ OF WATER 30 MINUTES BEFORE BREAKFAST) (Patient taking differently: Take 70 mg by mouth once a week. Monday) 12 tablet 2  . aspirin EC 81 MG tablet Take 1 tablet (81 mg total) by mouth daily. 90 tablet 3  . BREO ELLIPTA 100-25 MCG/INH AEPB USE 1 INHALATION DAILY 60 each 2  . busPIRone (BUSPAR) 10 MG tablet TAKE ONE (1) TABLET THREE (3) TIMES EACH DAY 270 tablet 1  . calcium carbonate (OS-CAL) 600 MG TABS tablet Take 600 mg by mouth daily with breakfast.    . cephALEXin (KEFLEX) 500 MG capsule Take 500 mg by mouth daily. Uses as suppressive therapy for bladder infections    . cetirizine (ZYRTEC) 10 MG chewable tablet 1/2 tablet daily     . cholecalciferol (VITAMIN D) 1000 UNITS tablet Take 1,000 Units by mouth daily.    . famotidine (PEPCID) 20 MG tablet Take 20 mg by mouth daily.    . fluticasone (FLONASE) 50 MCG/ACT nasal spray USE 2 SPRAYS IN EACH NOSTRIL DAILY 16 g 11  . ibuprofen (ADVIL,MOTRIN) 200 MG tablet Take 200 mg by mouth every 6 (six) hours as needed.    . megestrol (MEGACE) 40 MG tablet TAKE 1/2 TABLET TWICE DAILY 90 tablet 0  . Multiple Vitamins-Minerals (CENTRUM SILVER) tablet Take 1 tablet by mouth every evening.    . nitroGLYCERIN (NITROSTAT) 0.4 MG SL tablet Place 1 tablet (0.4 mg total) under the tongue every 5 (five) minutes x 3 doses as needed for chest pain (if no relief after 3rd dose, proceed to the ED for an evaluation or call 911). 25 tablet 3  . olopatadine (PATADAY) 0.1 % ophthalmic solution Place 1 drop into both eyes daily.     . simvastatin (ZOCOR) 40 MG tablet TAKE ONE (1) TABLET EACH DAY 90 tablet 0  . predniSONE (DELTASONE) 10 MG tablet 2 tabs daily for 3 days (Patient not taking: Reported on 10/20/2018) 6 tablet 0   Facility-Administered Medications Prior to Visit  Medication Dose Route Frequency Provider Last Rate Last Dose  .  cyanocobalamin ((VITAMIN B-12)) injection 1,000 mcg  1,000 mcg Intramuscular Q30 days Chipper Herb, MD   1,000 mcg at 09/23/18 1150     Review of Systems:   Constitutional:   No  weight loss, night sweats,  Fevers, chills,  +fatigue, or  lassitude.  HEENT:   No headaches,  Difficulty swallowing,  Tooth/dental problems, or  Sore throat,                No sneezing, itching, ear ache, nasal congestion, post nasal drip,   CV:  No chest pain,  Orthopnea, PND, swelling in lower extremities, anasarca, dizziness, palpitations, syncope.   GI  No heartburn, indigestion, abdominal pain, nausea, vomiting, diarrhea, change in bowel habits, loss of appetite, bloody stools.   Resp:  No coughing up of blood.  No change in color of mucus.  No wheezing.  No  chest wall deformity  Skin: no rash or lesions.  GU: no dysuria, change in color of urine, no urgency or frequency.  No flank pain, no hematuria   MS:  No joint pain or swelling.  No decreased range of motion.  No back pain.    Physical Exam  BP 114/70 (BP Location: Right Arm, Cuff Size: Normal)   Pulse 70   Temp (!) 97.5 F (36.4 C) (Oral)   Ht 4\' 10"  (1.473 m)   Wt 109 lb 3.2 oz (49.5 kg)   SpO2 96%   BMI 22.82 kg/m   GEN: A/Ox3; pleasant , NAD, elderly  In wc    HEENT:  [place]/AT,   , NOSE-clear, THROAT-clear, no lesions, no postnasal drip or exudate noted.   NECK:  Supple w/ fair ROM; no JVD; normal carotid impulses w/o bruits; no thyromegaly or nodules palpated; no lymphadenopathy.    RESP  Faint BB crackles  no accessory muscle use, no dullness to percussion  CARD:  RRR, no m/r/g, no peripheral edema, pulses intact, no cyanosis or clubbing. Neg homans sing, no calf tenderness   GI:   Soft & nt; nml bowel sounds; no organomegaly or masses detected.   Musco: Warm bil, no deformities or joint swelling noted.   Neuro: alert, no focal deficits noted.    Skin: Warm, no lesions or rashes    Lab Results:  CBC   BNP   ProBNP No results found for: PROBNP  Imaging: Dg Chest 2 View  Result Date: 10/16/2018 CLINICAL DATA:  Shortness of breath, history of pneumothorax. EXAM: CHEST - 2 VIEW COMPARISON:  Multiple priors, most recent April 17, 2018 FINDINGS: Tiny area of loculated pneumothorax along the lateral right upper lobe is even less conspicuous than on the previous study. Interstitial opacities persist with similar pattern compared to the prior study. Cardiomediastinal contours are stable. No dense consolidation or effusion. IMPRESSION: 1. Tiny right apical pneumothorax may be slightly smaller. 2. Chronic interstitial lung disease, based on diffuse nature of disease there would be difficult to exclude developing infection. No consolidation or effusion is seen. Electronically Signed   By: Zetta Bills M.D.   On: 10/16/2018 14:20    cyanocobalamin ((VITAMIN B-12)) injection 1,000 mcg    Date Action Dose Route User   09/23/2018 1150 Given 1000 mcg Intramuscular (Left Deltoid) Michaela Corner, LPN   [id number] [id number] Given 1000 mcg Intramuscular (Right Deltoid) Rostosky, Jessica C, RMA      No flowsheet data found.  No results found for: NITRICOXIDE      Assessment & Plan:   Interstitial lung disease ([place]) Possible progressive however no O2 desats with ambulation, remains fairly independent at 85 yr old.  Suspect dyspnea is combination of age, deconditioning , co-morbidities, restrictive lung disease with kyphosis , ILD .  Recent CXR okay without acute process.  Check labs to r.o other etiology  Doubt PE as no desats or other sx classic for PE , however on megace so need to consider . If suspicion increases would order CT chest PE protocol . Doubt D Dimer would be helpful as increased with age.  Hold on HRCT chest to look for ILD progression as would not change course of therapy as she would not want antifibrotics  Plan  Patient Instructions  Continue on BREO daily.  Rinse after use.  Continue on  Zyrtec and Flonase .  Activity as tolerated.  Labs today .  Flu shot per Primary MD  Follow up with  Dr. Chase Caller Or Diane Hanel in 3 months and As needed    Please contact office for sooner follow up if symptoms do not improve or worsen or seek emergency care        Dyspnea DOE - progressive DOE over last few years with decreased activity tolerance . This could be progressive ILD however however no O2 desats with ambulation, remains fairly independent at 85 yr old.  Suspect dyspnea is combination of age, deconditioning , co-morbidities, restrictive lung disease with kyphosis , ILD, spinal stenosis .  Recent CXR okay without acute process.  Check labs to r.o other etiology  Doubt PE as no desats or other sx classic for PE , however on megace so need to consider . If suspicion increases would order CT chest PE protocol . Doubt D Dimer would be helpful as increased with age.  Hold on HRCT chest to look for ILD progression as would not change course of therapy as she would not want antifibrotics      Rexene Edison, NP 10/20/2018

## 2018-10-20 NOTE — Assessment & Plan Note (Signed)
DOE - progressive DOE over last few years with decreased activity tolerance . This could be progressive ILD however however no O2 desats with ambulation, remains fairly independent at 85 yr old.  Suspect dyspnea is combination of age, deconditioning , co-morbidities, restrictive lung disease with kyphosis , ILD, spinal stenosis .  Recent CXR okay without acute process.  Check labs to r.o other etiology  Doubt PE as no desats or other sx classic for PE , however on megace so need to consider . If suspicion increases would order CT chest PE protocol . Doubt D Dimer would be helpful as increased with age.  Hold on HRCT chest to look for ILD progression as would not change course of therapy as she would not want antifibrotics

## 2018-10-20 NOTE — Patient Instructions (Addendum)
Continue on BREO daily.  Rinse after use.  Continue on Zyrtec and Flonase .  Activity as tolerated.  Labs today .  Flu shot per Primary MD  Follow up with Dr. Chase Caller Or Leilah Polimeni in 3 months and As needed    Please contact office for sooner follow up if symptoms do not improve or worsen or seek emergency care

## 2018-10-20 NOTE — Assessment & Plan Note (Signed)
Possible progressive however no O2 desats with ambulation, remains fairly independent at 85 yr old.  Suspect dyspnea is combination of age, deconditioning , co-morbidities, restrictive lung disease with kyphosis , ILD .  Recent CXR okay without acute process.  Check labs to r.o other etiology  Doubt PE as no desats or other sx classic for PE , however on megace so need to consider . If suspicion increases would order CT chest PE protocol . Doubt D Dimer would be helpful as increased with age.  Hold on HRCT chest to look for ILD progression as would not change course of therapy as she would not want antifibrotics  Plan  Patient Instructions  Continue on BREO daily.  Rinse after use.  Continue on Zyrtec and Flonase .  Activity as tolerated.  Labs today .  Flu shot per Primary MD  Follow up with Dr. Chase Caller Or Parrett in 3 months and As needed    Please contact office for sooner follow up if symptoms do not improve or worsen or seek emergency care

## 2018-10-21 ENCOUNTER — Encounter: Payer: Self-pay | Admitting: Family Medicine

## 2018-10-21 LAB — CBC WITH DIFFERENTIAL/PLATELET
Basophils Absolute: 0.1 10*3/uL (ref 0.0–0.1)
Basophils Relative: 0.7 % (ref 0.0–3.0)
Eosinophils Absolute: 0.3 10*3/uL (ref 0.0–0.7)
Eosinophils Relative: 3.4 % (ref 0.0–5.0)
HCT: 38.9 % (ref 36.0–46.0)
Hemoglobin: 13 g/dL (ref 12.0–15.0)
Lymphocytes Relative: 9.4 % — ABNORMAL LOW (ref 12.0–46.0)
Lymphs Abs: 0.7 10*3/uL (ref 0.7–4.0)
MCHC: 33.5 g/dL (ref 30.0–36.0)
MCV: 88.3 fl (ref 78.0–100.0)
Monocytes Absolute: 0.8 10*3/uL (ref 0.1–1.0)
Monocytes Relative: 10.3 % (ref 3.0–12.0)
Neutro Abs: 5.7 10*3/uL (ref 1.4–7.7)
Neutrophils Relative %: 76.2 % (ref 43.0–77.0)
Platelets: 281 10*3/uL (ref 150.0–400.0)
RBC: 4.41 Mil/uL (ref 3.87–5.11)
RDW: 13.5 % (ref 11.5–15.5)
WBC: 7.5 10*3/uL (ref 4.0–10.5)

## 2018-10-21 LAB — BASIC METABOLIC PANEL
BUN: 12 mg/dL (ref 6–23)
CO2: 35 mEq/L — ABNORMAL HIGH (ref 19–32)
Calcium: 9.7 mg/dL (ref 8.4–10.5)
Chloride: 101 mEq/L (ref 96–112)
Creatinine, Ser: 0.62 mg/dL (ref 0.40–1.20)
GFR: 90.17 mL/min (ref >60.00)
Glucose, Bld: 93 mg/dL (ref 70–99)
Potassium: 4.1 mEq/L (ref 3.5–5.1)
Sodium: 142 mEq/L (ref 135–145)

## 2018-10-21 LAB — BRAIN NATRIURETIC PEPTIDE: Pro B Natriuretic peptide (BNP): 395 pg/mL — ABNORMAL HIGH (ref 0.0–100.0)

## 2018-10-23 ENCOUNTER — Other Ambulatory Visit: Payer: Self-pay | Admitting: Adult Health

## 2018-10-23 DIAGNOSIS — R7989 Other specified abnormal findings of blood chemistry: Secondary | ICD-10-CM

## 2018-10-23 DIAGNOSIS — R0789 Other chest pain: Secondary | ICD-10-CM

## 2018-10-23 MED ORDER — FUROSEMIDE 20 MG PO TABS: ORAL_TABLET | ORAL | 1 refills | Status: DC

## 2018-10-23 NOTE — Progress Notes (Signed)
Spoke with patient's daughter [name] who is listed on DPR pts daughter aware:BNP marker is elevated similar to earlier this year.  Set up 2 D echo .  Try Lasix 20mg  daily for 2 days then As needed For leg swelling . Dont want to make her too weak.  Lasix 20mg  #30 , 1 daily As needed Leg swelling 1 refill .   Nothing further needed.

## 2018-10-23 NOTE — Telephone Encounter (Signed)
[name], Patient has sent you these messages this afternoon.  Hey sorry to bother y'all again but is it ok for mama to get b12 and flu shot on Monday 12th?  Hello I looked up the heart Dr that [name] saw in [name], [name]. For her Echo test. She's seen Dr [name]. Thanks for doing the blood work, of course it's more to be concerned with, but we need to know. [name]   Message routed to [place], NP to advise

## 2018-10-23 NOTE — Progress Notes (Signed)
Echo ordered Lasix with 1 refill ordered.

## 2018-10-26 ENCOUNTER — Encounter: Payer: Self-pay | Admitting: Family Medicine

## 2018-10-26 ENCOUNTER — Ambulatory Visit (INDEPENDENT_AMBULATORY_CARE_PROVIDER_SITE_OTHER): Payer: Medicare Other | Admitting: *Deleted

## 2018-10-26 ENCOUNTER — Other Ambulatory Visit: Payer: Self-pay

## 2018-10-26 DIAGNOSIS — E538 Deficiency of other specified B group vitamins: Secondary | ICD-10-CM | POA: Diagnosis not present

## 2018-10-26 DIAGNOSIS — Z23 Encounter for immunization: Secondary | ICD-10-CM

## 2018-11-05 ENCOUNTER — Other Ambulatory Visit: Payer: Self-pay | Admitting: Adult Health

## 2018-11-05 ENCOUNTER — Ambulatory Visit (HOSPITAL_COMMUNITY)
Admission: RE | Admit: 2018-11-05 | Discharge: 2018-11-05 | Disposition: A | Discharge status: Home / Self Care | Payer: Medicare Other | Source: Ambulatory Visit | Attending: Adult Health | Admitting: Adult Health

## 2018-11-05 ENCOUNTER — Other Ambulatory Visit: Payer: Self-pay

## 2018-11-05 DIAGNOSIS — R7989 Other specified abnormal findings of blood chemistry: Secondary | ICD-10-CM | POA: Diagnosis not present

## 2018-11-05 DIAGNOSIS — I5020 Unspecified systolic (congestive) heart failure: Secondary | ICD-10-CM

## 2018-11-05 DIAGNOSIS — R0789 Other chest pain: Secondary | ICD-10-CM

## 2018-11-05 DIAGNOSIS — R0602 Shortness of breath: Secondary | ICD-10-CM

## 2018-11-05 NOTE — Progress Notes (Signed)
*  PRELIMINARY RESULTS* Echocardiogram 2D Echocardiogram has been performed.  [name] 11/05/2018, 2:48 PM

## 2018-11-06 ENCOUNTER — Ambulatory Visit (HOSPITAL_COMMUNITY)
Admission: RE | Admit: 2018-11-06 | Discharge: 2018-11-06 | Disposition: A | Discharge status: Home / Self Care | Payer: Medicare Other | Source: Ambulatory Visit | Attending: Adult Health | Admitting: Adult Health

## 2018-11-06 ENCOUNTER — Telehealth: Payer: Self-pay | Admitting: Internal Medicine

## 2018-11-06 DIAGNOSIS — R0602 Shortness of breath: Secondary | ICD-10-CM | POA: Insufficient documentation

## 2018-11-06 MED ORDER — IOHEXOL 350 MG/ML SOLN
80.0000 mL | Freq: Once | INTRAVENOUS | Status: AC | PRN
  Administered 2018-11-06: 15:00:00 80 mL via INTRAVENOUS

## 2018-11-06 NOTE — Progress Notes (Signed)
Called spoke with patient's daughter [name].  Advised of CTA results / recs as stated by Parrett NP.  Spouse verbalized understanding and denied any questions.

## 2018-11-06 NOTE — Telephone Encounter (Addendum)
IPF-PRO Registry (late entry)  On 10/21/2018, I spoke to Ms. Elisse Dascoli's daughter in regards to her current trial participation. I informed her daughter that she is due for a 6 month follow-up visit to collect blood work and complete patient reported outcome questionnaires. I attempted to schedule her mother for her return visit within the next week. The daughter declined and explained that her mother is weak and has difficulty getting around. The daughter did not believe it was in her mother's best interest to return to the clinic for these assessments. She stated her mother would not be able to provide blood nor answer questionnaires at this time or in the future. She decided it was in her mother's best interest to withdraw from the study at this time. She was thanked for her time on the phone call.   [place] Assistant [facility]

## 2018-11-10 ENCOUNTER — Ambulatory Visit (INDEPENDENT_AMBULATORY_CARE_PROVIDER_SITE_OTHER): Payer: Medicare Other | Admitting: Cardiology

## 2018-11-10 ENCOUNTER — Encounter: Payer: Self-pay | Admitting: Cardiology

## 2018-11-10 ENCOUNTER — Ambulatory Visit: Payer: Medicare Other | Admitting: Cardiology

## 2018-11-10 ENCOUNTER — Other Ambulatory Visit: Payer: Self-pay

## 2018-11-10 VITALS — BP 109/75 | HR 64 | Ht 59.0 in | Wt 107.4 lb

## 2018-11-10 DIAGNOSIS — I350 Nonrheumatic aortic (valve) stenosis: Secondary | ICD-10-CM | POA: Diagnosis not present

## 2018-11-10 DIAGNOSIS — I429 Cardiomyopathy, unspecified: Secondary | ICD-10-CM

## 2018-11-10 DIAGNOSIS — J84112 Idiopathic pulmonary fibrosis: Secondary | ICD-10-CM

## 2018-11-10 DIAGNOSIS — I2510 Atherosclerotic heart disease of native coronary artery without angina pectoris: Secondary | ICD-10-CM

## 2018-11-10 MED ORDER — SPIRONOLACTONE 25 MG PO TABS: 12.5000 mg | ORAL_TABLET | Freq: Every day | ORAL | 1 refills | Status: DC

## 2018-11-10 NOTE — Progress Notes (Signed)
Cardiology Office Note  Date: 11/10/2018   ID: [name] 1925/08/03, MRN [id number]  PCP:  Janora Norlander, DO  Cardiologist:  [name], MD Electrophysiologist:  None   Chief Complaint  Patient presents with   Cardiac follow-up    History of Present Illness: [name] is a 85 y.o. female last assessed via telehealth encounter in June.  She is here today for follow-up with a family member.  I reviewed the chart.  Echocardiogram was ordered by [name] recently, results outlined below and show evidence of newly documented cardiomyopathy with LVEF approximately 35% (absolute duration uncertain however), possibly also low gradient aortic stenosis. She has known idiopathic pulmonary fibrosis followed by Pulmonary, and also coronary artery calcifications by CT imaging which we have managed medically.  She is functionally limited at baseline, chronically short of breath and has assistance from family members.  She has had intermittent leg swelling as well, BNP was 395 in early October.  Today we went over the results of her echocardiogram and discussed the implications.  I think it is most likely that her functional decline and shortness of breath are multifactorial.  I am also not inclined to put her through an aggressive invasive work-up (cardiac catheterization, TEE, TAVR, etc.) and both the patient and her family member voiced agreement at this time.  We went over her medications.  She has been using low-dose Lasix.  We also talked about adding low-dose Aldactone.  Current blood pressure and heart rate look good.  Past Medical History:  Diagnosis Date   Arthritis    Cataract    Chronic back pain    Glaucoma    Hyperlipidemia    Idiopathic pulmonary fibrosis (HCC)    Lumbar stenosis    Osteoarthrosis and allied disorders    Osteoporosis    Peripheral neuropathy    Vitamin D deficiency     Past Surgical History:  Procedure Laterality  Date   ABDOMINAL HYSTERECTOMY     CATARACT EXTRACTION     Cataract surgery Bilateral    Eyelid surgery     KNEE ARTHROSCOPY     LUMBAR LAMINECTOMY      Current Outpatient Medications  Medication Sig Dispense Refill   albuterol (PROVENTIL) (2.5 MG/3ML) 0.083% nebulizer solution NEBULIZE 1 VIAL EVERY 6 HOURS AS NEEDED (Patient taking differently: Take 2.5 mg by nebulization at bedtime. ) 150 mL 5   alendronate (FOSAMAX) 70 MG tablet TAKE 1 TABLET WEEKLY (TAKE WITH 8OZ OF WATER 30 MINUTES BEFORE BREAKFAST) (Patient taking differently: Take 70 mg by mouth once a week. Monday) 12 tablet 2   aspirin EC 81 MG tablet Take 1 tablet (81 mg total) by mouth daily. 90 tablet 3   BREO ELLIPTA 100-25 MCG/INH AEPB USE 1 INHALATION DAILY 60 each 2   busPIRone (BUSPAR) 10 MG tablet TAKE ONE (1) TABLET THREE (3) TIMES EACH DAY 270 tablet 1   calcium carbonate (OS-CAL) 600 MG TABS tablet Take 600 mg by mouth daily with breakfast.     cephALEXin (KEFLEX) 500 MG capsule Take 500 mg by mouth daily. Uses as suppressive therapy for bladder infections     cetirizine (ZYRTEC) 10 MG chewable tablet 1/2 tablet daily      cholecalciferol (VITAMIN D) 1000 UNITS tablet Take 1,000 Units by mouth daily.     famotidine (PEPCID) 20 MG tablet Take 20 mg by mouth daily.     fluticasone (FLONASE) 50 MCG/ACT nasal spray USE 2 SPRAYS  IN EACH NOSTRIL DAILY 16 g 11   furosemide (LASIX) 20 MG tablet Take 20 mg by mouth every other day. & one extra tablet as needed for weight gain (3 lbs/24 hours or 5 lbs/one week)  or swelling     ibuprofen (ADVIL,MOTRIN) 200 MG tablet Take 200 mg by mouth every 6 (six) hours as needed.     megestrol (MEGACE) 40 MG tablet TAKE 1/2 TABLET TWICE DAILY 90 tablet 0   Multiple Vitamins-Minerals (CENTRUM SILVER) tablet Take 1 tablet by mouth every evening.     nitroGLYCERIN (NITROSTAT) 0.4 MG SL tablet Place 1 tablet (0.4 mg total) under the tongue every 5 (five) minutes x 3 doses as  needed for chest pain (if no relief after 3rd dose, proceed to the ED for an evaluation or call 911). 25 tablet 3   olopatadine (PATADAY) 0.1 % ophthalmic solution Place 1 drop into both eyes daily.      simvastatin (ZOCOR) 40 MG tablet TAKE ONE (1) TABLET EACH DAY 90 tablet 0   spironolactone (ALDACTONE) 25 MG tablet Take 0.5 tablets (12.5 mg total) by mouth daily. 45 tablet 1   Current Facility-Administered Medications  Medication Dose Route Frequency Provider Last Rate Last Dose   cyanocobalamin ((VITAMIN B-12)) injection 1,000 mcg  1,000 mcg Intramuscular Q30 days Chipper Herb, MD   1,000 mcg at 10/26/18 1046   Allergies:  Levaquin [levaquin leva-pak], Mucinex [guaifenesin er], Bentyl [dicyclomine hcl], Yellow dyes (non-tartrazine), Erythromycin, and Penicillins   Social History: The patient  reports that she has never smoked. Her smokeless tobacco use includes chew. She reports that she does not drink alcohol or use drugs.   ROS:  Please see the history of present illness. Otherwise, complete review of systems is positive for hearing loss.  All other systems are reviewed and negative.   Physical Exam: VS:  BP 109/75    Pulse 64    Ht 4\' 11"  (1.499 m)    Wt 107 lb 6.4 oz (48.7 kg)    SpO2 100%    BMI 21.69 kg/m , BMI Body mass index is 21.69 kg/m.  Wt Readings from Last 3 Encounters:  11/10/18 107 lb 6.4 oz (48.7 kg)  10/20/18 109 lb 3.2 oz (49.5 kg)  09/04/18 106 lb 6.4 oz (48.3 kg)    General: Frail elderly woman, no distress. HEENT: Conjunctiva and lids normal, wearing a mask. Neck: Supple, no elevated JVP or carotid bruits, no thyromegaly. Lungs: Coarse breath sounds, no wheezing, nonlabored breathing at rest. Cardiac: Regular rate and rhythm, no S3, soft systolic murmur, no pericardial rub. Abdomen: Soft, nontender, bowel sounds present, no guarding or rebound. Extremities: Mild right lower leg edema, distal pulses 2+. Skin: Warm and dry. Musculoskeletal: Kyphosis  present. Neuropsychiatric: Alert and oriented x3, affect grossly appropriate.  ECG:  An ECG dated 03/20/2018 was personally reviewed today and demonstrated:  Sinus rhythm with left bundle branch block.  Recent Labwork: 03/20/2018: B Natriuretic Peptide 575.3 06/22/2018: ALT 12; AST 25; TSH 0.588 10/20/2018: BUN 12; Creatinine, Ser 0.62; Hemoglobin 13.0; Platelets 281.0; Potassium 4.1; Pro B Natriuretic peptide (BNP) 395.0; Sodium 142     Component Value Date/Time   CHOL 125 06/22/2018 1528   CHOL 158 07/16/2012 1151   TRIG 66 06/22/2018 1528   TRIG 105 06/22/2014 1121   TRIG 134 07/16/2012 1151   HDL 48 06/22/2018 1528   HDL 66 06/22/2014 1121   HDL 57 07/16/2012 1151   CHOLHDL 2.6 06/22/2018 1528   LDLCALC  64 06/22/2018 1528   LDLCALC 76 07/02/2013 1115   LDLCALC 74 07/16/2012 1151    Other Studies Reviewed Today:  Echocardiogram 11/05/2018:  1. Left ventricular ejection fraction, by visual estimation, is 35%. The left ventricle has moderately decreased function. Normal left ventricular size. There is mildly increased left ventricular hypertrophy.  2. Abnormal septal motion consistent with left bundle branch block.  3. Left ventricular diastolic Doppler parameters are consistent with impaired relaxation pattern of LV diastolic filling.  4. Global right ventricle was not well visualized.The right ventricular size is normal. No increase in right ventricular wall thickness.  5. Left atrial size was upper normal.  6. Right atrial size was normal.  7. Mild mitral annular calcification.  8. Mild to moderate aortic valve annular calcification.  9. The mitral valve is grossly normal. Trace mitral valve regurgitation. 10. The tricuspid valve is grossly normal. Tricuspid valve regurgitation is trivial. 11. The aortic valve has an indeterminant number of cusps, possibly functionally bicuspid and moderately calcified. Valve gradients are not significantly elevated, but leaflet motion is  significantly decreased - consider low gradient severe aortic  stenosis. Aortic valve regurgitation is mild by color flow Doppler. 12. The pulmonic valve was not well visualized. Pulmonic valve regurgitation is not visualized by color flow Doppler. 13. Moderately elevated pulmonary artery systolic pressure. 14. The inferior vena cava is normal in size with greater than 50% respiratory variability, suggesting right atrial pressure of 3 mmHg.  Chest CT 11/06/2018: FINDINGS: Cardiovascular: There is satisfactory opacification of the pulmonary arteries to the segmental level. There is no evidence of a pulmonary embolism.  Heart is normal in size. Left coronary artery calcifications. No pericardial effusion. Great vessels are normal in caliber. Aortic atherosclerosis. No dissection.  Mediastinum/Nodes: Prominent thyroid with evidence of small nodule stable from the prior CT. Multiple subcentimeter, but prominent shotty right paratracheal lymph nodes, largest 9 mm in short axis. 13 mm short axis subcarinal node. 1 cm short axis right infrahilar lymph node. Trachea is enlarged. Esophagus is unremarkable.  Lungs/Pleura: Interstitial fibrosis reflected by heterogeneous areas of interstitial thickening, bronchiectasis and architectural distortion. There are areas of honeycombing. Appearance is similar to the prior chest CT. No lung mass or suspicious nodule. No evidence of pneumonia or pulmonary edema. No pleural effusion or pneumothorax.  Upper Abdomen: No acute abnormality.  Musculoskeletal: No fracture or acute finding.  No bone lesion.  Review of the MIP images confirms the above findings.  IMPRESSION: 1. No pulmonary embolism. 2. No acute findings. 3. Interstitial fibrosis without change from the prior CT. 4. Left coronary artery calcification and aortic atherosclerosis.  Aortic Atherosclerosis (ICD10-I70.0).  Assessment and Plan:  1.  Secondary cardiomyopathy,  recently documented with LVEF approximately 35%.  RV contraction is normal.  Plan is conservative management without aggressive invasive work-up as discussed above.  She does have coronary artery calcifications and we have been treating her with aspirin and statin therapy.  She also has nitroglycerin available but denies any recent chest pain.  We will add Aldactone 12.5 mg daily and Lasix can be used every other day or on an as-needed basis depending on leg swelling and weight gain.  Check BMET in 1 week.  Holding off on adding beta-blocker with current heart rate and left bundle branch block on ECG, also hold off on ARB/ANRI with current low normal blood pressures.  2.  Possible low gradient aortic stenosis based on limited assessment and echocardiography.  Again, I would not pursue this aggressively at  this time.  We did discuss the results of her testing.  3.  Idiopathic pulmonary fibrosis, continues to follow with Pulmonary.  Recent CT imaging showed no progressive changes.  4.  Mixed hyperlipidemia, she remains on Zocor.  Last LDL was 64.  Medication Adjustments/Labs and Tests Ordered: Current medicines are reviewed at length with the patient today.  Concerns regarding medicines are outlined above.   Tests Ordered: Orders Placed This Encounter  Procedures   Basic metabolic panel    Medication Changes: Meds ordered this encounter  Medications   spironolactone (ALDACTONE) 25 MG tablet    Sig: Take 0.5 tablets (12.5 mg total) by mouth daily.    Dispense:  45 tablet    Refill:  1    11/10/2018 NEW    Disposition:  Follow up 6 weeks in the [place] office with [place].  Signed, Satira Sark, MD, [facility] 11/10/2018 1:46 PM    [facility] [place], [place], [place] [address] Phone: [phone]; Fax: [phone]

## 2018-11-10 NOTE — Patient Instructions (Addendum)
Medication Instructions:   Your physician has recommended you make the following change in your medication:   Start spironolactone 12.5 mg by mouth daily  Decrease lasix to 20 mg by mouth every other day and one extra tablet as needed for weight gain of 3 lbs/24 hours or 5 lbs/one week or for swelling  Continue all other medications the same  Labwork:  Your physician recommends that you return for non-fasting lab work in: one week to check your BMET. You may have this done at [facility] or [facility] in [place].   Testing/Procedures:  NONE  Follow-Up:  Your physician recommends that you schedule a follow-up appointment in: 6 weeks with Bernerd Pho at the [place] office.   Any Other Special Instructions Will Be Listed Below (If Applicable).  If you need a refill on your cardiac medications before your next appointment, please call your pharmacy.

## 2018-11-18 ENCOUNTER — Other Ambulatory Visit (HOSPITAL_COMMUNITY)
Admission: RE | Admit: 2018-11-18 | Discharge: 2018-11-18 | Disposition: A | Discharge status: Home / Self Care | Payer: Medicare Other | Source: Ambulatory Visit | Attending: Cardiology | Admitting: Cardiology

## 2018-11-18 ENCOUNTER — Telehealth: Payer: Self-pay | Admitting: *Deleted

## 2018-11-18 DIAGNOSIS — J84112 Idiopathic pulmonary fibrosis: Secondary | ICD-10-CM | POA: Insufficient documentation

## 2018-11-18 DIAGNOSIS — I2510 Atherosclerotic heart disease of native coronary artery without angina pectoris: Secondary | ICD-10-CM | POA: Insufficient documentation

## 2018-11-18 DIAGNOSIS — I429 Cardiomyopathy, unspecified: Secondary | ICD-10-CM | POA: Insufficient documentation

## 2018-11-18 DIAGNOSIS — I350 Nonrheumatic aortic (valve) stenosis: Secondary | ICD-10-CM | POA: Diagnosis not present

## 2018-11-18 LAB — BASIC METABOLIC PANEL
Anion gap: 11 (ref 5–15)
BUN: 14 mg/dL (ref 8–23)
CO2: 30 mmol/L (ref 22–32)
Calcium: 9.3 mg/dL (ref 8.9–10.3)
Chloride: 101 mmol/L (ref 98–111)
Creatinine, Ser: 0.66 mg/dL (ref 0.44–1.00)
GFR calc Af Amer: >60 mL/min (ref >60)
GFR calc non Af Amer: >60 mL/min (ref >60)
Glucose, Bld: 92 mg/dL (ref 70–99)
Potassium: 4.1 mmol/L (ref 3.5–5.1)
Sodium: 142 mmol/L (ref 135–145)

## 2018-11-18 NOTE — Telephone Encounter (Signed)
-----   Message from Satira Sark, MD sent at 11/18/2018  1:28 PM EST ----- Results reviewed.  Renal function potassium are normal after recent addition of Aldactone.  No changes for now.

## 2018-11-18 NOTE — Telephone Encounter (Signed)
Daughter informed. Copy sent to PCP

## 2018-11-20 ENCOUNTER — Other Ambulatory Visit: Payer: Self-pay | Admitting: Family Medicine

## 2018-11-25 ENCOUNTER — Other Ambulatory Visit: Payer: Medicare Other

## 2018-11-26 ENCOUNTER — Ambulatory Visit: Payer: Medicare Other

## 2018-12-02 ENCOUNTER — Ambulatory Visit (INDEPENDENT_AMBULATORY_CARE_PROVIDER_SITE_OTHER): Payer: Medicare Other

## 2018-12-02 ENCOUNTER — Other Ambulatory Visit: Payer: Self-pay

## 2018-12-02 DIAGNOSIS — E538 Deficiency of other specified B group vitamins: Secondary | ICD-10-CM | POA: Diagnosis not present

## 2018-12-02 NOTE — Progress Notes (Signed)
Cyanocobalamin injection given to left deltoid.  Patient tolerated well. 

## 2018-12-03 ENCOUNTER — Other Ambulatory Visit: Payer: Self-pay | Admitting: Family Medicine

## 2018-12-14 ENCOUNTER — Encounter: Payer: Self-pay | Admitting: Family Medicine

## 2018-12-16 ENCOUNTER — Telehealth: Payer: Self-pay | Admitting: Family Medicine

## 2018-12-16 NOTE — Chronic Care Management (AMB) (Signed)
°  Chronic Care Management   Outreach Note  12/16/2018 Name: [name] MRN: [id number] DOB: 07/08/1925  Referred by: Janora Norlander, DO Reason for referral : Chronic Care Management (Initial CCM outreach was unsuccessful. )   An unsuccessful telephone outreach was attempted today. The patient was referred to the case management team by for assistance with care management and care coordination.   Follow Up Plan: A HIPPA compliant phone message was left for the patient providing contact information and requesting a return call.  The care management team will reach out to the patient again over the next 7 days.  If patient returns call to provider office, please advise to call [facility] [place], [place] [address]  [place], [place] [address] Direct Dial: [email or web address]  Website: [email or web address]

## 2018-12-16 NOTE — Chronic Care Management (AMB) (Signed)
Chronic Care Management   Note  12/16/2018 Name: CHASTIN GARLITZ MRN: [id number] DOB: [birth year]  [name] is a 85 y.o. year old female who is a primary care patient of Janora Norlander, DO. I reached out to Vaughan Basta by phone today in response to a referral sent by Ms. Ivan An[place]ors [place]'s health plan.     Ms. Aguinaldo was given information about Chronic Care Management services today including:  1. CCM service includes personalized support from designated clinical staff supervised by her physician, including individualized plan of care and coordination with other care providers 2. 24/7 contact phone numbers for assistance for urgent and routine care needs. 3. Service will only be billed when office clinical staff spend 20 minutes or more in a month to coordinate care. 4. Only one practitioner may furnish and bill the service in a calendar month. 5. The patient may stop CCM services at any time (effective at the end of the month) by phone call to the office staff. 6. The patient will be responsible for cost sharing (co-pay) of up to 20% of the service fee (after annual deductible is met).  Patients daughter [name] did not agree to enrollment in care management services and does not wish to consider at this time.  Follow up plan: The patient has been provided with contact information for the chronic care management team and has been advised to call with any health related questions or concerns.   [place], [place] [address] Direct Dial: [email or web address]  Website: [email or web address]

## 2018-12-18 ENCOUNTER — Encounter: Payer: Self-pay | Admitting: Family Medicine

## 2018-12-18 NOTE — Telephone Encounter (Signed)
Mychart message received by pt which is posted below:   To: [facility] POOL    From: ASYAH HUGUENIN    Created: 12/18/2018 9:45 AM     *-*-*This message has not been handled.*-*-*  This is [name] with a question for [name]. What are your thoughts with her health issues on her getting the covid immunization? I'm concerned with now and long term affects, her age and her health. I'm sure it's going to start being offered at some point and I'm just not sure what to say if asked about her getting it. Thanks for any input.      MR, please advise on this for pt. Thanks!

## 2018-12-18 NOTE — Telephone Encounter (Signed)
When and If the FDA authorizes it they will say who can get it. If she meets criteria at that time she should get it but if she wants to hold off she can - is her choice

## 2018-12-21 ENCOUNTER — Ambulatory Visit (INDEPENDENT_AMBULATORY_CARE_PROVIDER_SITE_OTHER): Payer: Medicare Other | Admitting: Family Medicine

## 2018-12-21 ENCOUNTER — Encounter: Payer: Self-pay | Admitting: Family Medicine

## 2018-12-21 ENCOUNTER — Other Ambulatory Visit: Payer: Self-pay

## 2018-12-21 VITALS — BP 132/78 | HR 85 | Temp 96.6°F | Ht 59.0 in | Wt 101.8 lb

## 2018-12-21 DIAGNOSIS — N3000 Acute cystitis without hematuria: Secondary | ICD-10-CM | POA: Diagnosis not present

## 2018-12-21 DIAGNOSIS — I2510 Atherosclerotic heart disease of native coronary artery without angina pectoris: Secondary | ICD-10-CM | POA: Diagnosis not present

## 2018-12-21 DIAGNOSIS — R300 Dysuria: Secondary | ICD-10-CM | POA: Diagnosis not present

## 2018-12-21 LAB — URINALYSIS, COMPLETE
Bilirubin, UA: NEGATIVE
Glucose, UA: NEGATIVE
Ketones, UA: NEGATIVE
Nitrite, UA: POSITIVE — AB
Specific Gravity, UA: 1.015 (ref 1.005–1.030)
Urobilinogen, Ur: 1 mg/dL (ref 0.2–1.0)
pH, UA: 5.5 (ref 5.0–7.5)

## 2018-12-21 LAB — MICROSCOPIC EXAMINATION: WBC, UA: >30 /hpf — AB (ref 0–5)

## 2018-12-21 MED ORDER — SULFAMETHOXAZOLE-TRIMETHOPRIM 800-160 MG PO TABS: 1.0000 | ORAL_TABLET | Freq: Two times a day (BID) | ORAL | 0 refills | Status: DC

## 2018-12-21 NOTE — Progress Notes (Signed)
BP 132/78   Pulse 85   Temp (!) 96.6 F (35.9 C) (Temporal)   Ht 4\' 11"  (1.499 m)   Wt 101 lb 12.8 oz (46.2 kg)   SpO2 97%   BMI 20.56 kg/m    Subjective:   Patient ID: [name], female    DOB: [birth year], 85 y.o.   MRN: [id number]  HPI: [name] is a 85 y.o. female presenting on 12/21/2018 for Dysuria (x 2 month follow up)   HPI Patient comes in complaining of dysuria that started over the past couple days.  She is coming in for 64-month follow-up.  She has been on Keflex for prevention for urinary tract infections but she still gets them sometimes although it has been a little bit since she has had one.  Patient denies any fevers or chills or shortness of breath or wheezing.  Relevant past medical, surgical, family and social history reviewed and updated as indicated. Interim medical history since our last visit reviewed. Allergies and medications reviewed and updated.  Review of Systems  Constitutional: Negative for chills and fever.  Respiratory: Negative for chest tightness and shortness of breath.   Cardiovascular: Negative for chest pain and leg swelling.  Gastrointestinal: Negative for abdominal pain.  Genitourinary: Positive for dysuria, frequency and urgency. Negative for difficulty urinating, flank pain, hematuria, vaginal bleeding, vaginal discharge and vaginal pain.  Musculoskeletal: Negative for back pain and gait problem.  Skin: Negative for rash.  Neurological: Negative for light-headedness and headaches.  Psychiatric/Behavioral: Negative for agitation and behavioral problems.  All other systems reviewed and are negative.   Per HPI unless specifically indicated above    Objective:   BP 132/78   Pulse 85   Temp (!) 96.6 F (35.9 C) (Temporal)   Ht 4\' 11"  (1.499 m)   Wt 101 lb 12.8 oz (46.2 kg)   SpO2 97%   BMI 20.56 kg/m   Wt Readings from Last 3 Encounters:  12/21/18 101 lb 12.8 oz (46.2 kg)  11/10/18 107 lb 6.4 oz (48.7 kg)  10/20/18  109 lb 3.2 oz (49.5 kg)    Physical Exam Vitals signs and nursing note reviewed.  Constitutional:      General: She is not in acute distress.    Appearance: She is well-developed. She is not diaphoretic.  Eyes:     Conjunctiva/sclera: Conjunctivae normal.  Cardiovascular:     Rate and Rhythm: Normal rate and regular rhythm.     Heart sounds: Normal heart sounds. No murmur.  Pulmonary:     Effort: Pulmonary effort is normal. No respiratory distress.     Breath sounds: Normal breath sounds. No wheezing.  Abdominal:     General: Bowel sounds are normal. There is no distension.     Palpations: Abdomen is soft. Abdomen is not rigid. There is no mass.     Tenderness: There is no abdominal tenderness. There is no guarding or rebound.  Musculoskeletal: Normal range of motion.        General: No tenderness.  Skin:    General: Skin is warm and dry.     Findings: No rash.  Neurological:     Mental Status: She is alert and oriented to person, place, and time.     Coordination: Coordination normal.  Psychiatric:        Behavior: Behavior normal.     Urinalysis: Greater than 30 the BCs, 3-10 RBCs, 0-10 epithelial cells, many bacteria, nitrite positive, blood 2+ leukocytes 2+  Assessment &  Plan:   Problem List Items Addressed This Visit    None    Visit Diagnoses    Acute cystitis without hematuria    -  Primary   Relevant Orders   urinalysis- dip and micro   Urine culture       Follow up plan: Return if symptoms worsen or fail to improve.  Counseling provided for all of the vaccine components Orders Placed This Encounter  Procedures  . Urine culture  . urinalysis- dip and micro    Caryl Pina, MD [facility] 12/21/2018, 4:38 PM

## 2018-12-22 ENCOUNTER — Encounter: Payer: Self-pay | Admitting: Family Medicine

## 2018-12-22 ENCOUNTER — Telehealth: Payer: Self-pay | Admitting: Family Medicine

## 2018-12-22 MED ORDER — NITROFURANTOIN MONOHYD MACRO 100 MG PO CAPS: 100.0000 mg | ORAL_CAPSULE | Freq: Two times a day (BID) | ORAL | 0 refills | Status: DC

## 2018-12-22 NOTE — Telephone Encounter (Signed)
Received the following message from patient's daughter:  "Sorry, not that one, she takes it as a maintenance drug for UTI's but at times she will still get one. So she went yesterday and has one she was given Bactrim and that was another she was allergic to so today he put her on Macrobid ( nitrofurantoin). I needed to know if that would hurt her. Seems like there was an issue before but trying to keep up with it all I'm not positive. I did call Dr Jeffie Pollock her bladder dr and the nurse said she didn't see anything in her chart about this medicine. Just thought I'd ask, thanks"  She wanted to know if Macrobid would be a safe option for her mother. She had read online reviews that it will sometimes cause respiratory issues in some patients.   MR, please advise. Thanks!

## 2018-12-23 NOTE — Telephone Encounter (Signed)
macrobid should not be given in setting of pulmonary fibrosis. If she is already on it then not more than 1 week on it

## 2018-12-24 ENCOUNTER — Ambulatory Visit (INDEPENDENT_AMBULATORY_CARE_PROVIDER_SITE_OTHER): Payer: Medicare Other | Admitting: Student

## 2018-12-24 ENCOUNTER — Ambulatory Visit (HOSPITAL_COMMUNITY)
Admission: RE | Admit: 2018-12-24 | Discharge: 2018-12-24 | Disposition: A | Discharge status: Home / Self Care | Payer: Medicare Other | Source: Ambulatory Visit | Attending: Student | Admitting: Student

## 2018-12-24 ENCOUNTER — Other Ambulatory Visit: Payer: Self-pay

## 2018-12-24 ENCOUNTER — Encounter: Payer: Self-pay | Admitting: Student

## 2018-12-24 VITALS — BP 133/70 | HR 78 | Temp 97.8°F | Ht 59.0 in | Wt 101.8 lb

## 2018-12-24 DIAGNOSIS — Z8709 Personal history of other diseases of the respiratory system: Secondary | ICD-10-CM

## 2018-12-24 DIAGNOSIS — I2510 Atherosclerotic heart disease of native coronary artery without angina pectoris: Secondary | ICD-10-CM | POA: Diagnosis not present

## 2018-12-24 DIAGNOSIS — I350 Nonrheumatic aortic (valve) stenosis: Secondary | ICD-10-CM | POA: Diagnosis not present

## 2018-12-24 DIAGNOSIS — R0602 Shortness of breath: Secondary | ICD-10-CM | POA: Diagnosis not present

## 2018-12-24 DIAGNOSIS — J84112 Idiopathic pulmonary fibrosis: Secondary | ICD-10-CM

## 2018-12-24 DIAGNOSIS — I5042 Chronic combined systolic (congestive) and diastolic (congestive) heart failure: Secondary | ICD-10-CM

## 2018-12-24 DIAGNOSIS — R0600 Dyspnea, unspecified: Secondary | ICD-10-CM

## 2018-12-24 DIAGNOSIS — R0609 Other forms of dyspnea: Secondary | ICD-10-CM

## 2018-12-24 NOTE — Patient Instructions (Signed)
Medication Instructions:  Your physician recommends that you continue on your current medications as directed. Please refer to the Current Medication list given to you today.  *If you need a refill on your cardiac medications before your next appointment, please call your pharmacy*  Lab Work: NONE  If you have labs (blood work) drawn today and your tests are completely normal, you will receive your results only by: Marland Kitchen MyChart Message (if you have MyChart) OR . A paper copy in the mail If you have any lab test that is abnormal or we need to change your treatment, we will call you to review the results.  Testing/Procedures: A chest x-ray takes a picture of the organs and structures inside the chest, including the heart, lungs, and blood vessels. This test can show several things, including, whether the heart is enlarges; whether fluid is building up in the lungs; and whether pacemaker / defibrillator leads are still in place.   Follow-Up: At [facility], you and your health needs are our priority.  As part of our continuing mission to provide you with exceptional heart care, we have created designated Provider Care Teams.  These Care Teams include your primary Cardiologist (physician) and Advanced Practice Providers (APPs -  Physician Assistants and Nurse Practitioners) who all work together to provide you with the care you need, when you need it.  Your next appointment:   2 month(s)  The format for your next appointment:   In Person  Provider:   You may see [name], MD or one of the following Advanced Practice Providers on your designated Care Team:    Bernerd Pho, PA-C   Ermalinda Barrios, PA-C    Other Instructions Thank you for choosing [facility]!

## 2018-12-24 NOTE — Progress Notes (Signed)
Cardiology Office Note    Date:  12/24/2018   ID:  [name], [name] May 21, 1925, MRN [id number]  PCP:  Janora Norlander, DO  Cardiologist: [name], MD    Chief Complaint  Patient presents with  . Follow-up    6 week visit    History of Present Illness:    [name] is a 85 y.o. female with past medical history of chronic systolic CHF (EF [id number] by echo in 10/2018), CAD (coronary calcifications by prior CT with medical therapy pursued), aortic stenosis, HLD, and Pulmonary Fibrosis who presents to the office today for 6-week follow-up.  She was last examined by Dr. Domenic Polite in 11/10/2018 for follow-up for recent echocardiogram which had been ordered by pulmonology and showed a reduced EF of 35% with possibly severe low gradient aortic stenosis.  Options were reviewed with the patient and her family and given her advanced age and comorbidities, medical management of her CHF and aortic stenosis was agreed upon in comparison to an aggressive invasive work-up.  She was continued on ASA and statin therapy along with as needed Lasix. Aldactone 12.5 mg daily was added to her medication regimen. Was not started on a beta-blocker given her heart rate in the 60's and known left bundle branch block on EKG. Follow-up labs showed stable potassium and renal function.  In talking with the patient and her daughter today, she reports having multiple issues over the past several weeks.  She has been suffering from a UTI and was started on antibiotic therapy by her PCP but was informed by her Pulmonologist and Urologist not to take it for an extended period of time. The patient herself denies any dysuria but family had noticed worsening confusion in the earlier part of the week.  She has reported intermittent episodes of discomfort along her left pectoral region which is typically worse with positional changes. Her daughter says she mentioned having discomfort after reaching for an object behind  her on the couch several days ago. Her daughter is concerned as the discomfort and dyspnea she has experienced resembles when she had a pneumothorax in 10/2018. She does have baseline dyspnea on exertion but denies any orthopnea or PND. No recent lower extremity edema. She has stopped taking Lasix daily as this was causing her to have significant weakness. She reports only consuming an 8 ounce bottle of water a day along with less than 12 ounces of soda.  Past Medical History:  Diagnosis Date  . Arthritis   . Cataract   . CHF (congestive heart failure) ([place])    a. EF 35% by echo in 10/2018  . Chronic back pain   . Glaucoma   . Hyperlipidemia   . Idiopathic pulmonary fibrosis ([place])   . Lumbar stenosis   . Osteoarthrosis and allied disorders   . Osteoporosis   . Peripheral neuropathy   . Vitamin D deficiency     Past Surgical History:  Procedure Laterality Date  . ABDOMINAL HYSTERECTOMY    . CATARACT EXTRACTION    . Cataract surgery Bilateral   . Eyelid surgery    . KNEE ARTHROSCOPY    . LUMBAR LAMINECTOMY      Current Medications: Outpatient Medications Prior to Visit  Medication Sig Dispense Refill  . albuterol (PROVENTIL) (2.5 MG/3ML) 0.083% nebulizer solution NEBULIZE 1 VIAL EVERY 6 HOURS AS NEEDED (Patient taking differently: Take 2.5 mg by nebulization at bedtime. ) 150 mL 5  . alendronate (FOSAMAX) 70 MG tablet TAKE 1  TABLET WEEKLY (TAKE WITH 8OZ OF WATER 30 MINUTES BEFORE BREAKFAST) (Patient taking differently: Take 70 mg by mouth once a week. Monday) 12 tablet 2  . aspirin EC 81 MG tablet Take 1 tablet (81 mg total) by mouth daily. 90 tablet 3  . BREO ELLIPTA 100-25 MCG/INH AEPB USE 1 INHALATION DAILY 60 each 2  . busPIRone (BUSPAR) 10 MG tablet TAKE ONE (1) TABLET THREE (3) TIMES EACH DAY 270 tablet 1  . calcium carbonate (OS-CAL) 600 MG TABS tablet Take 600 mg by mouth daily with breakfast.    . cephALEXin (KEFLEX) 500 MG capsule Take 500 mg by mouth daily. Uses as  suppressive therapy for bladder infections    . cetirizine (ZYRTEC) 10 MG chewable tablet 1/2 tablet daily     . cholecalciferol (VITAMIN D) 1000 UNITS tablet Take 1,000 Units by mouth daily.    . famotidine (PEPCID) 20 MG tablet Take 20 mg by mouth daily.    . fluticasone (FLONASE) 50 MCG/ACT nasal spray USE 2 SPRAYS IN EACH NOSTRIL DAILY 16 g 11  . furosemide (LASIX) 20 MG tablet Take 20 mg by mouth every other day. & one extra tablet as needed for weight gain (3 lbs/24 hours or 5 lbs/one week)  or swelling    . ibuprofen (ADVIL,MOTRIN) 200 MG tablet Take 200 mg by mouth every 6 (six) hours as needed.    . megestrol (MEGACE) 40 MG tablet TAKE 1/2 TABLET TWICE DAILY 90 tablet 0  . Multiple Vitamins-Minerals (CENTRUM SILVER) tablet Take 1 tablet by mouth every evening.    . nitroGLYCERIN (NITROSTAT) 0.4 MG SL tablet Place 1 tablet (0.4 mg total) under the tongue every 5 (five) minutes x 3 doses as needed for chest pain (if no relief after 3rd dose, proceed to the ED for an evaluation or call 911). 25 tablet 3  . olopatadine (PATADAY) 0.1 % ophthalmic solution Place 1 drop into both eyes daily.     . simvastatin (ZOCOR) 40 MG tablet TAKE ONE (1) TABLET EACH DAY 90 tablet 0  . spironolactone (ALDACTONE) 25 MG tablet Take 0.5 tablets (12.5 mg total) by mouth daily. 45 tablet 1  . nitrofurantoin, macrocrystal-monohydrate, (MACROBID) 100 MG capsule Take 1 capsule (100 mg total) by mouth 2 (two) times daily. 1 po BId 14 capsule 0  . sulfamethoxazole-trimethoprim (BACTRIM DS) 800-160 MG tablet Take 1 tablet by mouth 2 (two) times daily. 20 tablet 0   Facility-Administered Medications Prior to Visit  Medication Dose Route Frequency Provider Last Rate Last Admin  . cyanocobalamin ((VITAMIN B-12)) injection 1,000 mcg  1,000 mcg Intramuscular Q30 days Chipper Herb, MD   1,000 mcg at 12/02/18 1131     Allergies:   Levaquin [levaquin leva-pak], Mucinex [guaifenesin er], Bactrim  [sulfamethoxazole-trimethoprim], Bentyl [dicyclomine hcl], Yellow dyes (non-tartrazine), Erythromycin, and Penicillins   Social History   Socioeconomic History  . Marital status: Widowed    Spouse name: Not on file  . Number of children: Not on file  . Years of education: 5  . Highest education level: Not on file  Occupational History  . Not on file  Tobacco Use  . Smoking status: Never Smoker  . Smokeless tobacco: Current User    Types: Chew  Substance and Sexual Activity  . Alcohol use: No    Alcohol/week: 0.0 standard drinks  . Drug use: No  . Sexual activity: Not Currently    Birth control/protection: Post-menopausal  Other Topics Concern  . Not on file  Social  History Narrative  . Not on file   Social Determinants of Health   Financial Resource Strain: Low Risk   . Difficulty of Paying Living Expenses: Not hard at all  Food Insecurity: No Food Insecurity  . Worried About Charity fundraiser in the Last Year: Never true  . Ran Out of Food in the Last Year: Never true  Transportation Needs: No Transportation Needs  . Lack of Transportation (Medical): No  . Lack of Transportation (Non-Medical): No  Physical Activity: Sufficiently Active  . Days of Exercise per Week: 7 days  . Minutes of Exercise per Session: 30 min  Stress: No Stress Concern Present  . Feeling of Stress : Not at all  Social Connections: Slightly Isolated  . Frequency of Communication with Friends and Family: More than three times a week  . Frequency of Social Gatherings with Friends and Family: More than three times a week  . Attends Religious Services: More than 4 times per year  . Active Member of Clubs or Organizations: Yes  . Attends Archivist Meetings: More than 4 times per year  . Marital Status: Widowed     Family History:  The patient's family history includes Asthma in her daughter and son; Bone cancer in her mother; Breast cancer in her sister; COPD in her son; Diabetes in her  brother.   Review of Systems:   Please see the history of present illness.     General:  No chills, fever, night sweats or weight changes.  Cardiovascular:  No edema, orthopnea, palpitations, paroxysmal nocturnal dyspnea. Positive for chest pain and dyspnea on exertion.  Dermatological: No rash, lesions/masses Respiratory: No cough, dyspnea Urologic: No hematuria, dysuria Abdominal:   No nausea, vomiting, diarrhea, bright red blood per rectum, melena, or hematemesis Neurologic:  No visual changes, wkns, changes in mental status. All other systems reviewed and are otherwise negative except as noted above.   Physical Exam:    VS:  BP 133/70   Pulse 78   Temp 97.8 F (36.6 C)   Ht 4\' 11"  (1.499 m)   Wt 101 lb 12.8 oz (46.2 kg)   BMI 20.56 kg/m    General: Frail elderly female appearing in no acute distress. Head: Normocephalic, atraumatic, sclera non-icteric, no xanthomas, nares are without discharge.  Neck: No carotid bruits. JVD not elevated.  Lungs: Respirations regular and unlabored, without wheezes or rales.  Heart: Regular rate and rhythm. No S3 or S4.  No rubs or gallops appreciated. 2/6 SEM along RUSB.  Abdomen: Soft, non-tender, non-distended with normoactive bowel sounds. No hepatomegaly. No rebound/guarding. No obvious abdominal masses. Msk:  Strength and tone appear normal for age. No joint deformities or effusions. Extremities: No clubbing or cyanosis. No lower extremity edema.  Distal pedal pulses are 2+ bilaterally. Neuro: Alert and oriented X 3. Moves all extremities spontaneously. No focal deficits noted. Psych:  Responds to questions appropriately with a normal affect. Skin: No rashes or lesions noted  Wt Readings from Last 3 Encounters:  12/24/18 101 lb 12.8 oz (46.2 kg)  12/21/18 101 lb 12.8 oz (46.2 kg)  11/10/18 107 lb 6.4 oz (48.7 kg)     Studies/Labs Reviewed:   EKG:  EKG is not ordered today.   Recent Labs: 03/20/2018: B Natriuretic Peptide 575.3  06/22/2018: ALT 12; TSH 0.588 10/20/2018: Hemoglobin 13.0; Platelets 281.0; Pro B Natriuretic peptide (BNP) 395.0 11/18/2018: BUN 14; Creatinine, Ser 0.66; Potassium 4.1; Sodium 142   Lipid Panel    Component  Value Date/Time   CHOL 125 06/22/2018 1528   CHOL 158 07/16/2012 1151   TRIG 66 06/22/2018 1528   TRIG 105 06/22/2014 1121   TRIG 134 07/16/2012 1151   HDL 48 06/22/2018 1528   HDL 66 06/22/2014 1121   HDL 57 07/16/2012 1151   CHOLHDL 2.6 06/22/2018 1528   LDLCALC 64 06/22/2018 1528   LDLCALC 76 07/02/2013 1115   LDLCALC 74 07/16/2012 1151    Additional studies/ records that were reviewed today include:   Echocardiogram: 11/05/2018 IMPRESSIONS    1. Left ventricular ejection fraction, by visual estimation, is 35%. The left ventricle has moderately decreased function. Normal left ventricular size. There is mildly increased left ventricular hypertrophy.  2. Abnormal septal motion consistent with left bundle branch block.  3. Left ventricular diastolic Doppler parameters are consistent with impaired relaxation pattern of LV diastolic filling.  4. Global right ventricle was not well visualized.The right ventricular size is normal. No increase in right ventricular wall thickness.  5. Left atrial size was upper normal.  6. Right atrial size was normal.  7. Mild mitral annular calcification.  8. Mild to moderate aortic valve annular calcification.  9. The mitral valve is grossly normal. Trace mitral valve regurgitation. 10. The tricuspid valve is grossly normal. Tricuspid valve regurgitation is trivial. 11. The aortic valve has an indeterminant number of cusps, possibly functionally bicuspid and moderately calcified. Valve gradients are not significantly elevated, but leaflet motion is significantly decreased - consider low gradient severe aortic  stenosis. Aortic valve regurgitation is mild by color flow Doppler. 12. The pulmonic valve was not well visualized. Pulmonic valve  regurgitation is not visualized by color flow Doppler. 13. Moderately elevated pulmonary artery systolic pressure. 14. The inferior vena cava is normal in size with greater than 50% respiratory variability, suggesting right atrial pressure of 3 mmHg.   CTA: 11/06/2018 IMPRESSION: 1. No pulmonary embolism. 2. No acute findings. 3. Interstitial fibrosis without change from the prior CT. 4. Left coronary artery calcification and aortic atherosclerosis.   Assessment:    1. Chronic combined systolic and diastolic heart failure ([place])   2. Dyspnea on exertion   3. History of pneumothorax   4. Coronary artery disease involving native coronary artery of native heart without angina pectoris   5. Nonrheumatic aortic valve stenosis   6. IPF (idiopathic pulmonary fibrosis) ([place])      Plan:   In order of problems listed above:  1. Chronic Combined Systolic CHF/Dyspnea on Exertion - Most recent echocardiogram in 10/2018 showed a reduced EF of 35%. She has baseline dyspnea on exertion but denies any recent orthopnea, PND or edema. She had significant weakness with daily Lasix and symptoms did not significantly improve when this was changed to every other day. She has not taken this within a week and overall volume status appears stable by examination. I recommended that they follow daily weights and take 20 mg if needed for worsening edema or weight gain greater than 2 pounds overnight or 5 pounds in 1 week. - Continue low-dose Spironolactone 12.5 mg daily.  I did not titrate this at the time of her visit given her current UTI and poor oral intake. Once recovered from UTI and improved oral intake, would titrate to 25mg  daily or add low-dose Losartan 12.5mg  daily and I encouraged her daughter to reach out if symptoms improve prior to her next visit. Not on BB given previous HR in the 50's and known LBBB.  - she reports worsening dyspnea  this week and her daughter says she complained of similar  symptoms when diagnosed with a pneumothorax in the past. Will obtain repeat CXR today.  2. CAD - She had coronary calcifications by prior CT imaging. She has baseline dyspnea on exertion and does report intermittent episodes of chest pain which are worse with certain movements of her arm. Overall, seems more consistent with musculoskeletal discomfort. - Continue ASA and statin therapy. She does have SL NTG but has not had to utilize this regularly.   3. Aortic Stenosis - Low gradient severe AS could not be ruled out by her most recent echocardiogram in 10/2018. Options have been reviewed with the patient and her family and conservative measures have been agreed upon given her advanced age and comorbidities.  4. Pulmonary Fibrosis - She is followed by Pulmonology. Remains on Breo Ellipta along with as needed Albuterol.   Medication Adjustments/Labs and Tests Ordered: Current medicines are reviewed at length with the patient today.  Concerns regarding medicines are outlined above.  Medication changes, Labs and Tests ordered today are listed in the Patient Instructions below. Patient Instructions  Medication Instructions:  Your physician recommends that you continue on your current medications as directed. Please refer to the Current Medication list given to you today.  *If you need a refill on your cardiac medications before your next appointment, please call your pharmacy*  Lab Work: NONE  If you have labs (blood work) drawn today and your tests are completely normal, you will receive your results only by: Marland Kitchen MyChart Message (if you have MyChart) OR . A paper copy in the mail If you have any lab test that is abnormal or we need to change your treatment, we will call you to review the results.  Testing/Procedures: A chest x-ray takes a picture of the organs and structures inside the chest, including the heart, lungs, and blood vessels. This test can show several things, including, whether  the heart is enlarges; whether fluid is building up in the lungs; and whether pacemaker / defibrillator leads are still in place.   Follow-Up: At [facility], you and your health needs are our priority.  As part of our continuing mission to provide you with exceptional heart care, we have created designated Provider Care Teams.  These Care Teams include your primary Cardiologist (physician) and Advanced Practice Providers (APPs -  Physician Assistants and Nurse Practitioners) who all work together to provide you with the care you need, when you need it.  Your next appointment:   2 month(s)  The format for your next appointment:   In Person  Provider:   You may see [name], MD or one of the following Advanced Practice Providers on your designated Care Team:    Bernerd Pho, PA-C   Ermalinda Barrios, PA-C    Other Instructions Thank you for choosing [facility]!    Signed, Erma Heritage, PA-C  12/24/2018 5:14 PM    [facility] [address] [place], [place] [address] Phone: [phone] Fax: [phone]

## 2018-12-25 ENCOUNTER — Encounter: Payer: Self-pay | Admitting: Family Medicine

## 2018-12-25 ENCOUNTER — Telehealth: Payer: Self-pay | Admitting: Family Medicine

## 2018-12-25 ENCOUNTER — Telehealth: Payer: Self-pay

## 2018-12-25 DIAGNOSIS — J9311 Primary spontaneous pneumothorax: Secondary | ICD-10-CM

## 2018-12-25 LAB — URINE CULTURE

## 2018-12-25 NOTE — Telephone Encounter (Signed)
I spoke with lab and they checked with [facility] and the sensitivities are not back yet. They said hopefully this afternoon or Monday. Patient is taking a daily preventative cephalexin until she is able to get results.

## 2018-12-25 NOTE — Telephone Encounter (Signed)
-----   Message from Erma Heritage, [place] sent at 12/25/2018 11:03 AM EST ----- Please let the patient's daughter know I talked with Rexene Edison (NP with [facility] Pulmonology) and she recommended a repeat CXR in 1 week for the pneumothorax. She can either have this done at [facility] and ordered under [place] or her daughter can make an appointment with Pulmonology in [place] (suspect [place] might be easier transportation wise).   They also recommended to avoid bending over for long periods or lifting heavy objects.   Thanks,  [place]

## 2018-12-25 NOTE — Telephone Encounter (Signed)
I spoke with daughter, patient will come to [facility] for repeat cxr next Friday, 01/01/2019. She also understands that her mother is to avoid bending and lifting heavy objects.

## 2018-12-25 NOTE — Telephone Encounter (Signed)
I will absolutely be glad to start an antibiotic pending sensitivities.  Can you please ask lab to see if they can call to get update on culture?

## 2018-12-25 NOTE — Telephone Encounter (Signed)
I faxed results to Dr. Jeffie Pollock. I see the culture results are preliminary. I am sending to you since Dr. Warrick Parisian is out of the office.

## 2018-12-29 ENCOUNTER — Encounter: Payer: Self-pay | Admitting: Family Medicine

## 2018-12-31 ENCOUNTER — Encounter: Payer: Self-pay | Admitting: Family Medicine

## 2019-01-01 ENCOUNTER — Other Ambulatory Visit: Payer: Self-pay

## 2019-01-01 ENCOUNTER — Ambulatory Visit (HOSPITAL_COMMUNITY)
Admission: RE | Admit: 2019-01-01 | Discharge: 2019-01-01 | Disposition: A | Discharge status: Home / Self Care | Payer: Medicare Other | Source: Ambulatory Visit | Attending: Student | Admitting: Student

## 2019-01-01 DIAGNOSIS — J9311 Primary spontaneous pneumothorax: Secondary | ICD-10-CM | POA: Insufficient documentation

## 2019-01-04 ENCOUNTER — Telehealth: Payer: Self-pay | Admitting: Adult Health

## 2019-01-04 ENCOUNTER — Ambulatory Visit: Payer: Medicare Other

## 2019-01-04 NOTE — Telephone Encounter (Signed)
Attempted to call [facility] but office is closed. wcb 12/22- need to see what exactly a [id number] is/entails.

## 2019-01-05 ENCOUNTER — Other Ambulatory Visit: Payer: Self-pay

## 2019-01-05 ENCOUNTER — Encounter: Payer: Self-pay | Admitting: Family Medicine

## 2019-01-05 ENCOUNTER — Telehealth: Payer: Self-pay | Admitting: Student

## 2019-01-05 DIAGNOSIS — J9383 Other pneumothorax: Secondary | ICD-10-CM

## 2019-01-05 NOTE — Telephone Encounter (Signed)
Please call pt's daughter [name]-- she's needing to know when pt is supposed to have her repeat chest xray done-- it's supposed to fall on Christmas day, but there are no orders in.   [phone]

## 2019-01-05 NOTE — Telephone Encounter (Signed)
-----   Message from Erma Heritage, [place] sent at 01/05/2019 11:17 AM EST ----- Please let the patient's daughter know I did talk with Pulmonology and she needs a repeat CXR in 1 week. Thank you.

## 2019-01-05 NOTE — Telephone Encounter (Signed)
Daughter Sandra notified.

## 2019-01-05 NOTE — Telephone Encounter (Signed)
Called and spoke with Colletta Maryland from [facility]. Per Colletta Maryland, it seems like the form that needs to be filled out needs to be handled by pt's PCP but the family wanted to see if she could get it taken care of by our office as it requires pt to have a visit within at least 30 days of the form being completed. Colletta Maryland said she was going to contact pt's family to have them call PCP to see if she can get appt scheduled or if they had an appt try to get that appt moved up sooner so the form could be taken care of as soon as possible for pt. Nothing further needed.

## 2019-01-06 ENCOUNTER — Ambulatory Visit (INDEPENDENT_AMBULATORY_CARE_PROVIDER_SITE_OTHER): Payer: Medicare Other

## 2019-01-06 ENCOUNTER — Other Ambulatory Visit: Payer: Self-pay | Admitting: Family Medicine

## 2019-01-06 DIAGNOSIS — E538 Deficiency of other specified B group vitamins: Secondary | ICD-10-CM

## 2019-01-06 DIAGNOSIS — N390 Urinary tract infection, site not specified: Secondary | ICD-10-CM

## 2019-01-06 LAB — MICROSCOPIC EXAMINATION: Bacteria, UA: NONE SEEN

## 2019-01-06 LAB — URINALYSIS, COMPLETE
Bilirubin, UA: NEGATIVE
Glucose, UA: NEGATIVE
Ketones, UA: NEGATIVE
Nitrite, UA: NEGATIVE
Protein,UA: NEGATIVE
Specific Gravity, UA: 1.015 (ref 1.005–1.030)
Urobilinogen, Ur: 1 mg/dL (ref 0.2–1.0)
pH, UA: 6 (ref 5.0–7.5)

## 2019-01-06 NOTE — Progress Notes (Signed)
Placed orders for urinalysis and culture, patient does have recurrent, will await culture  Urinalysis shows 0-5 WBCs, 0-2 RBCs, 0-10 epithelial cells, trace blood and trace leukocytes.  Caryl Pina, MD [facility] 01/06/2019, 10:54 AM

## 2019-01-06 NOTE — Progress Notes (Signed)
Cyanocobalamin injection given to right deltoid.  Patient tolerated well. 

## 2019-01-07 LAB — URINE CULTURE

## 2019-01-11 ENCOUNTER — Other Ambulatory Visit: Payer: Self-pay

## 2019-01-11 ENCOUNTER — Ambulatory Visit (HOSPITAL_COMMUNITY)
Admission: RE | Admit: 2019-01-11 | Discharge: 2019-01-11 | Disposition: A | Discharge status: Home / Self Care | Payer: Medicare Other | Source: Ambulatory Visit | Attending: Student | Admitting: Student

## 2019-01-11 DIAGNOSIS — J9383 Other pneumothorax: Secondary | ICD-10-CM | POA: Diagnosis present

## 2019-01-13 ENCOUNTER — Telehealth: Payer: Self-pay

## 2019-01-13 DIAGNOSIS — J9311 Primary spontaneous pneumothorax: Secondary | ICD-10-CM

## 2019-01-13 NOTE — Telephone Encounter (Signed)
-----   Message from Erma Heritage, [place] sent at 01/12/2019  9:54 AM EST ----- Please let the patient and her daughter know her X-ray shows no change in the small left pneumothorax but the right has decreased in size which is good news. If symptoms overall stable, repeat CXR in 2 weeks and keep scheduled follow-up with Pulmonology.

## 2019-01-13 NOTE — Telephone Encounter (Signed)
Results given to daughter, will repeat cxr in 2 weeks

## 2019-01-18 ENCOUNTER — Telehealth: Payer: Self-pay | Admitting: Internal Medicine

## 2019-01-18 DIAGNOSIS — J9383 Other pneumothorax: Secondary | ICD-10-CM

## 2019-01-18 NOTE — Telephone Encounter (Signed)
[name]  CCXr for her [name] shwoed small ptx.   Plan  - pleasearrange for HRCT supine alone enough at [facility] (copying her PCP Ronnie Doss M, DO  -> PA Bernerd Pho) Thanks    SIGNATURE    Dr. Brand Males, M.D., F.C.C.P,  Pulmonary and Critical Care Medicine Staff Physician, [place] Director - Interstitial Lung Disease  Program  Pulmonary [place] at [place], [name], [address]  Pager: [phone], If no answer or between  15:00h - 7:00h: call [phone] Telephone: [phone]  10:49 AM 01/18/2019

## 2019-01-18 NOTE — Telephone Encounter (Signed)
[name] returned call & can be reached [phone].

## 2019-01-18 NOTE — Telephone Encounter (Signed)
Attempted to call pt's daughter [name] to go over results of cxr and to let her know that MR wants pt to have a HRCT performed based on the results but unable to reach her. Left [name] a message for her to return call. I have pended the order for the HRCT that we can place after speaking with pt's daughter.

## 2019-01-18 NOTE — Telephone Encounter (Signed)
Called and spoke with pt's daughter [name] letting her know that MR wants pt to have a HRCT performed to further evaluate the recent cxt and she verbalized understanding. [name] said that originally [name], [name] had said for her to have a repeat cxr in 2 weeks but I told her that we are going to have her get a HRCT instead to further evaluate.  [name] said she has taken off work on 1/15 so I told her that we would try to see if we could get pt to have the ct performed then. I did put that in the comment section on the HRCT order. Routing this to Dr. Lajuana Ripple and Bernerd Pho, PA as an [place].

## 2019-01-21 ENCOUNTER — Other Ambulatory Visit: Payer: Self-pay | Admitting: Cardiology

## 2019-01-29 ENCOUNTER — Other Ambulatory Visit: Payer: Self-pay

## 2019-01-29 ENCOUNTER — Ambulatory Visit (HOSPITAL_COMMUNITY)
Admission: RE | Admit: 2019-01-29 | Discharge: 2019-01-29 | Disposition: A | Discharge status: Home / Self Care | Payer: Medicare Other | Source: Ambulatory Visit | Attending: Internal Medicine | Admitting: Internal Medicine

## 2019-01-29 ENCOUNTER — Ambulatory Visit (HOSPITAL_COMMUNITY): Payer: Medicare Other

## 2019-01-29 DIAGNOSIS — J9383 Other pneumothorax: Secondary | ICD-10-CM | POA: Insufficient documentation

## 2019-02-01 ENCOUNTER — Ambulatory Visit (INDEPENDENT_AMBULATORY_CARE_PROVIDER_SITE_OTHER): Payer: Medicare Other | Admitting: Family Medicine

## 2019-02-01 ENCOUNTER — Other Ambulatory Visit: Payer: Self-pay

## 2019-02-01 ENCOUNTER — Encounter: Payer: Self-pay | Admitting: Family Medicine

## 2019-02-01 VITALS — BP 119/66 | HR 75 | Ht 59.0 in | Wt 101.0 lb

## 2019-02-01 DIAGNOSIS — R634 Abnormal weight loss: Secondary | ICD-10-CM | POA: Diagnosis not present

## 2019-02-01 DIAGNOSIS — J84112 Idiopathic pulmonary fibrosis: Secondary | ICD-10-CM | POA: Diagnosis not present

## 2019-02-01 DIAGNOSIS — R5381 Other malaise: Secondary | ICD-10-CM | POA: Diagnosis not present

## 2019-02-01 DIAGNOSIS — R1310 Dysphagia, unspecified: Secondary | ICD-10-CM | POA: Diagnosis not present

## 2019-02-01 NOTE — Patient Instructions (Signed)
The study we talked about is called a modified barium swallow study.  If she decides she wants to get one, let me know.  Biotene is a good product to help with saliva production.  She looked a little dry today and that may be contributing to her difficulty swallowing.

## 2019-02-01 NOTE — Progress Notes (Signed)
Subjective: CC: Physical deconditioning, interstitial lung disease PCP: Janora Norlander, DO LG:[id number] [name] is a 86 y.o. female, who is accompanied today by her daughter.  She is presenting to clinic today for:  1.  Interstitial lung disease Patient is followed by pulmonology for her known interstitial lung disease.  She is compliant with her inhalers and has regular follow-up with her pulmonary specialist.  Infectious has a follow-up soon to review CAT scan that was recently obtained to follow-up on a pneumothorax.  Overall, she does have progressively worsening breathing since diagnosis but she has been stable and feels that she is generally doing well.  Her daughter comments that sometimes she does sound dyspneic on the telephone but seems to be breathing well now.  The patient notes that she is dissatisfied by the fact that she is deconditioned, specifically after recurrent illness.  She wishes she could do the things that she used to do when she was younger.  She is on a boost like supplemental drink.  She does not usually have very good appetite and often has a small meal in the morning and perhaps a snack in the evening.  She notes that she just does not have a very big appetite.  She also has some difficulty swallowing see below.  2.  Dysphagia Patient does report sensation of dysphagia, particularly on the left side of her throat.  She thinks that she may have a mass along her throat as she has felt a lump there before.  She is on a diuretic and has fairly good urine output with this.  She does not hydrate sufficiently though she is encouraged quite a bit by her family to do so.  She resides alone and therefore there is no one to really prompt her to eat or drink.   ROS: Per HPI  Allergies  Allergen Reactions  . Levaquin [Levaquin Leva-Pak] Shortness Of Breath and Palpitations    Patient cannot remember that she is sensitive to this medication; has never heard of it.  . Mucinex  [Guaifenesin Er] Other (See Comments)    Causes anxiety  . Bactrim [Sulfamethoxazole-Trimethoprim]   . Bentyl [Dicyclomine Hcl] Other (See Comments)    shakes  . Macrobid [Nitrofurantoin]   . Yellow Dyes (Non-Tartrazine) Other (See Comments)    unknown  . Erythromycin Diarrhea  . Penicillins Rash    Did it involve swelling of the face/tongue/throat, SOB, or low BP? No Did it involve sudden or severe rash/hives, skin peeling, or any reaction on the inside of your mouth or nose? No Did you need to seek medical attention at a hospital or doctor's office? No When did it last happen? If all above answers are "NO", may proceed with cephalosporin use.   Past Medical History:  Diagnosis Date  . Arthritis   . Cataract   . CHF (congestive heart failure) ([place])    a. EF 35% by echo in 10/2018  . Chronic back pain   . Glaucoma   . Hyperlipidemia   . Idiopathic pulmonary fibrosis ([place])   . Lumbar stenosis   . Osteoarthrosis and allied disorders   . Osteoporosis   . Peripheral neuropathy   . Vitamin D deficiency     Current Outpatient Medications:  .  albuterol (PROVENTIL) (2.5 MG/3ML) 0.083% nebulizer solution, NEBULIZE 1 VIAL EVERY 6 HOURS AS NEEDED (Patient taking differently: Take 2.5 mg by nebulization at bedtime. ), Disp: 150 mL, Rfl: 5 .  alendronate (FOSAMAX) 70 MG tablet, TAKE  1 TABLET WEEKLY (TAKE WITH 8OZ OF WATER 30 MINUTES BEFORE BREAKFAST) (Patient taking differently: Take 70 mg by mouth once a week. Monday), Disp: 12 tablet, Rfl: 2 .  aspirin EC 81 MG tablet, Take 1 tablet (81 mg total) by mouth daily., Disp: 90 tablet, Rfl: 3 .  BREO ELLIPTA 100-25 MCG/INH AEPB, USE 1 INHALATION DAILY, Disp: 60 each, Rfl: 2 .  busPIRone (BUSPAR) 10 MG tablet, TAKE ONE (1) TABLET THREE (3) TIMES EACH DAY, Disp: 270 tablet, Rfl: 1 .  calcium carbonate (OS-CAL) 600 MG TABS tablet, Take 600 mg by mouth daily with breakfast., Disp: , Rfl:  .  cephALEXin (KEFLEX) 500 MG capsule, Take 500 mg  by mouth daily. Uses as suppressive therapy for bladder infections, Disp: , Rfl:  .  cetirizine (ZYRTEC) 10 MG chewable tablet, 1/2 tablet daily , Disp: , Rfl:  .  cholecalciferol (VITAMIN D) 1000 UNITS tablet, Take 1,000 Units by mouth daily., Disp: , Rfl:  .  famotidine (PEPCID) 20 MG tablet, Take 20 mg by mouth daily., Disp: , Rfl:  .  fluticasone (FLONASE) 50 MCG/ACT nasal spray, USE 2 SPRAYS IN EACH NOSTRIL DAILY, Disp: 16 g, Rfl: 11 .  furosemide (LASIX) 20 MG tablet, Take 20 mg by mouth every other day. & one extra tablet as needed for weight gain (3 lbs/24 hours or 5 lbs/one week)  or swelling, Disp: , Rfl:  .  ibuprofen (ADVIL,MOTRIN) 200 MG tablet, Take 200 mg by mouth every 6 (six) hours as needed., Disp: , Rfl:  .  megestrol (MEGACE) 40 MG tablet, TAKE 1/2 TABLET TWICE DAILY, Disp: 90 tablet, Rfl: 0 .  Multiple Vitamins-Minerals (CENTRUM SILVER) tablet, Take 1 tablet by mouth every evening., Disp: , Rfl:  .  nitroGLYCERIN (NITROSTAT) 0.4 MG SL tablet, Place 1 tablet (0.4 mg total) under the tongue every 5 (five) minutes x 3 doses as needed for chest pain (if no relief after 3rd dose, proceed to the ED for an evaluation or call 911)., Disp: 25 tablet, Rfl: 3 .  olopatadine (PATADAY) 0.1 % ophthalmic solution, Place 1 drop into both eyes daily. , Disp: , Rfl:  .  simvastatin (ZOCOR) 40 MG tablet, TAKE ONE (1) TABLET EACH DAY, Disp: 90 tablet, Rfl: 0 .  spironolactone (ALDACTONE) 25 MG tablet, TAKE 1/2 TABLET DAILY, Disp: 45 tablet, Rfl: 1  Current Facility-Administered Medications:  .  cyanocobalamin ((VITAMIN B-12)) injection 1,000 mcg, 1,000 mcg, Intramuscular, Q30 days, Chipper Herb, MD, 1,000 mcg at 01/06/19 1039 Social History   Socioeconomic History  . Marital status: Widowed    Spouse name: Not on file  . Number of children: Not on file  . Years of education: 5  . Highest education level: Not on file  Occupational History  . Not on file  Tobacco Use  . Smoking status:  Never Smoker  . Smokeless tobacco: Current User    Types: Chew  Substance and Sexual Activity  . Alcohol use: No    Alcohol/week: 0.0 standard drinks  . Drug use: No  . Sexual activity: Not Currently    Birth control/protection: Post-menopausal  Other Topics Concern  . Not on file  Social History Narrative  . Not on file   Social Determinants of Health   Financial Resource Strain:   . Difficulty of Paying Living Expenses: Not on file  Food Insecurity:   . Worried About Charity fundraiser in the Last Year: Not on file  . Ran Out of Food in  the Last Year: Not on file  Transportation Needs:   . Lack of Transportation (Medical): Not on file  . Lack of Transportation (Non-Medical): Not on file  Physical Activity:   . Days of Exercise per Week: Not on file  . Minutes of Exercise per Session: Not on file  Stress:   . Feeling of Stress : Not on file  Social Connections:   . Frequency of Communication with Friends and Family: Not on file  . Frequency of Social Gatherings with Friends and Family: Not on file  . Attends Religious Services: Not on file  . Active Member of Clubs or Organizations: Not on file  . Attends Archivist Meetings: Not on file  . Marital Status: Not on file  Intimate Partner Violence:   . Fear of Current or Ex-Partner: Not on file  . Emotionally Abused: Not on file  . Physically Abused: Not on file  . Sexually Abused: Not on file   Family History  Problem Relation Age of Onset  . Bone cancer Mother   . Breast cancer Sister   . Diabetes Brother   . Asthma Daughter   . COPD Son   . Asthma Son     Objective: Office vital signs reviewed. BP 119/66   Pulse 75   Ht 4\' 11"  (1.499 m)   Wt 101 lb (45.8 kg)   SpO2 98%   BMI 20.40 kg/m   Physical Examination:  General: Awake, alert, frail appearing elderly female, No acute distress HEENT: Normal    Neck: No masses palpated. No lymphadenopathy; cricoid cartilage easily palpable    Eyes:  Excessive tearing noted    Throat: Mucous membranes are tacky Cardio: regular rate and rhythm, S1S2 heard, no murmurs appreciated Pulm: Expiratory wheezes noted in the left upper lung field.  Air movement is fair.  She has normal work of breathing on room air Extremities: warm, No edema, cyanosis or clubbing; +1 pulses bilaterally Neuro: Response to commands.  Speech appropriate. MSK: Poor tone.  She arrives in wheelchair today.  Assessment/ Plan: 86 y.o. female   1. Idiopathic pulmonary fibrosis ([place]) Continue to follow-up with pulmonology.  I did briefly review the CT scan today but will defer to them for further management of her underlying lung disease  2. Weight loss Currently being treated for Megace as an appetite stimulant.  We discussed adequate hydration and focusing on calorically dense foods.  3. Dysphagia, unspecified type Possibly related to oropharyngeal dryness.  I did offer a modified barium study if they wish to proceed with this but for now the patient does not want to.  I encouraged her to obtain a sialagogue like Biotene to try and moisturize the mouth.  I also offered referral to gastroenterology for consideration of EGD but the patient's daughter does not think that she would be able to withstand something like that at this time  4. Physical deconditioning May need to consider home physical therapy at some point.  But for now I think that really the focus needs to be made on getting some weight on her.  We discussed increasing protein intake.   No orders of the defined types were placed in this encounter.  No orders of the defined types were placed in this encounter.    Janora Norlander, DO [place] [phone]

## 2019-02-02 ENCOUNTER — Encounter: Payer: Self-pay | Admitting: Urology

## 2019-02-08 ENCOUNTER — Ambulatory Visit: Payer: Medicare Other

## 2019-02-08 ENCOUNTER — Encounter: Payer: Self-pay | Admitting: Adult Health

## 2019-02-08 ENCOUNTER — Ambulatory Visit (INDEPENDENT_AMBULATORY_CARE_PROVIDER_SITE_OTHER): Payer: Medicare Other | Admitting: Adult Health

## 2019-02-08 ENCOUNTER — Ambulatory Visit (INDEPENDENT_AMBULATORY_CARE_PROVIDER_SITE_OTHER): Payer: Medicare Other

## 2019-02-08 ENCOUNTER — Other Ambulatory Visit: Payer: Self-pay

## 2019-02-08 VITALS — BP 114/64 | HR 72 | Temp 97.6°F | Ht 59.0 in | Wt 99.8 lb

## 2019-02-08 DIAGNOSIS — J9311 Primary spontaneous pneumothorax: Secondary | ICD-10-CM

## 2019-02-08 DIAGNOSIS — J84112 Idiopathic pulmonary fibrosis: Secondary | ICD-10-CM | POA: Diagnosis not present

## 2019-02-08 NOTE — Assessment & Plan Note (Signed)
Currently stable - continue on current regimen  Plan  Patient Instructions  Continue on BREO daily.  Rinse after use.  Continue on Zyrtec and Flonase As needed   NO heavy lifting. No bending over for prolonged periods of time. Stress reducers as discussed.  COVID 19 vaccine as discussed.  Follow up with Dr. Chase Caller Or Dakwan Pridgen in 6 weeks with chest xray  and As needed    Please contact office for sooner follow up if symptoms do not improve or worsen or seek emergency care

## 2019-02-08 NOTE — Patient Instructions (Signed)
Continue on BREO daily.  Rinse after use.  Continue on Zyrtec and Flonase As needed   NO heavy lifting. No bending over for prolonged periods of time. Stress reducers as discussed.  COVID 19 vaccine as discussed.  Follow up with Dr. Chase Caller Or Lyndel Sarate in 6 weeks with chest xray  and As needed    Please contact office for sooner follow up if symptoms do not improve or worsen or seek emergency care

## 2019-02-08 NOTE — Assessment & Plan Note (Signed)
Persistent small pneumothoraces.  Most recent CT chest showed no right-sided pneumothorax with only a trace residual left-sided pneumothorax.  And a small volume pneumomediastinum.  Case was discussed with Dr. Chase Caller.  Patient would not be a surgical candidate due to her severe interstitial lung disease.  She is clinically stable.  O2 saturations are good on room air.  Have advised patient to not do heavy lifting and not bend over for prolonged periods of time.  We will continue to monitor closely.  Chest x-ray today is pending.  We will follow up on return visit with a chest x-ray.  Plan  Patient Instructions  Continue on BREO daily.  Rinse after use.  Continue on Zyrtec and Flonase As needed   NO heavy lifting. No bending over for prolonged periods of time. Stress reducers as discussed.  COVID 19 vaccine as discussed.  Follow up with Dr. Chase Caller Or O[place]wa Gilmer in 6 weeks with chest xray  and As needed    Please contact office for sooner follow up if symptoms do not improve or worsen or seek emergency care

## 2019-02-08 NOTE — Progress Notes (Signed)
@Patient  ID: [name], female    DOB: 11-08-1925, 86 y.o.   MRN: [id number]  Chief Complaint  Patient presents with  . Follow-up    ILD    Referring provider: Janora Norlander, DO  HPI: 86 year old female never smoker followed for interstitial lung disease, clinical diagnosis of IPF, lung nodule stable since 2012 Early 2020 with small right pneumothorax with conservative treatment, ongoing recurrent/persistent pneumothorax  TEST/EVENTS :  CT chest December 2017 bilateral interstitial reticulation with subpleural honeycombing with traction bronchiectasis compatible with chronic interstitial lung disease, stable pulmonary nodules,  High-resolution CT chest January 29, 2019-trace residual left sided pneumothorax, small volume pneumomediastinum adjacent to the distal esophagus ILD changes  02/08/2019 Follow up : ILD  Patient presents for a 70-month follow-up.  Patient has underlying interstitial lung disease, clinical diagnosis of IPF.  Over this past year she has had difficulty with recurrent and persistent pneumothorax starting in March 2020-seem to be almost completely resolved on follow-up chest x-ray April 17, 2018.  October 16, 2018 patient started having some increased shortness of breath.  Chest x-ray showed a tiny right apical pneumothorax.-Slightly smaller.  CT chest November 06, 2018 showed interstitial fibrosis.  No pneumothorax noted.  December 25, 2018 showed a small pneumothorax on the right. And on the left.  These persisted on serial chest x-rays through December.  As above high-resolution CT chest showed trace left-sided pneumothorax and small volume pneumomediastinum adjacent to the distal esophagus. Overall patient is feeling better.  She denies any chest pain.  Says she gets winded with heavy activity but tries to do light chores around the house.  She has several family members that help her.  She has a daily dry cough.  She denies any hemoptysis.  Appetite has been  fair.   Allergies  Allergen Reactions  . Levaquin [Levaquin Leva-Pak] Shortness Of Breath and Palpitations    Heart palpitations, shortness of breath  . Mucinex [Guaifenesin Er] Other (See Comments)    Causes anxiety  . Bactrim [Sulfamethoxazole-Trimethoprim]     Caused her to "feel sick" and resulted in dehydration  . Bentyl [Dicyclomine Hcl] Other (See Comments)    shakes  . Macrobid [Nitrofurantoin]     Not recommended given interstitial lung disease  . Yellow Dyes (Non-Tartrazine) Other (See Comments)    unknown  . Erythromycin Diarrhea, Nausea Only and Rash  . Penicillins Rash    Did it involve swelling of the face/tongue/throat, SOB, or low BP? No Did it involve sudden or severe rash/hives, skin peeling, or any reaction on the inside of your mouth or nose? No Did you need to seek medical attention at a hospital or doctor's office? No When did it last happen? If all above answers are "NO", may proceed with cephalosporin use.    Immunization History  Administered Date(s) Administered  . Fluad Quad(high Dose 65+) 10/26/2018  . Influenza Split 10/28/2012  . Influenza Whole 09/14/2009  . Influenza, High Dose Seasonal PF 10/07/2016, 11/11/2017  . Influenza, Quadrivalent, Recombinant, Inj, Pf 11/08/2015  . Influenza,inj,Quad PF,6+ Mos 10/24/2014  . Influenza,inj,quad, With Preservative 11/10/2013  . Pneumococcal Conjugate-13 12/31/2012  . Pneumococcal Polysaccharide-23 10/14/1996  . Td 10/15/2002  . Tdap 04/12/2010  . Zoster 02/05/2010    Past Medical History:  Diagnosis Date  . Arthritis   . Cataract   . CHF (congestive heart failure) ([place])    a. EF 35% by echo in 10/2018  . Chronic back pain   . Dry eyes   .  Glaucoma   . Hyperlipidemia   . Idiopathic pulmonary fibrosis ([place])   . Lumbar stenosis   . Osteoarthrosis and allied disorders   . Osteoporosis   . Peripheral neuropathy   . Vitamin D deficiency     Tobacco History: Social History   Tobacco  Use  Smoking Status Never Smoker  Smokeless Tobacco Current User  . Types: Chew   Ready to quit: Not Answered Counseling given: Not Answered   Outpatient Medications Prior to Visit  Medication Sig Dispense Refill  . albuterol (PROVENTIL) (2.5 MG/3ML) 0.083% nebulizer solution NEBULIZE 1 VIAL EVERY 6 HOURS AS NEEDED (Patient taking differently: Take 2.5 mg by nebulization at bedtime. ) 150 mL 5  . alendronate (FOSAMAX) 70 MG tablet TAKE 1 TABLET WEEKLY (TAKE WITH 8OZ OF WATER 30 MINUTES BEFORE BREAKFAST) (Patient taking differently: Take 70 mg by mouth once a week. Monday) 12 tablet 2  . aspirin EC 81 MG tablet Take 1 tablet (81 mg total) by mouth daily. 90 tablet 3  . BREO ELLIPTA 100-25 MCG/INH AEPB USE 1 INHALATION DAILY 60 each 2  . busPIRone (BUSPAR) 10 MG tablet TAKE ONE (1) TABLET THREE (3) TIMES EACH DAY 270 tablet 1  . calcium carbonate (OS-CAL) 600 MG TABS tablet Take 600 mg by mouth daily with breakfast.    . cephALEXin (KEFLEX) 500 MG capsule Take 500 mg by mouth daily. Uses as suppressive therapy for bladder infections    . cetirizine (ZYRTEC) 10 MG chewable tablet 1/2 tablet daily     . cholecalciferol (VITAMIN D) 1000 UNITS tablet Take 1,000 Units by mouth daily.    . famotidine (PEPCID) 20 MG tablet Take 20 mg by mouth daily.    . fluticasone (FLONASE) 50 MCG/ACT nasal spray USE 2 SPRAYS IN EACH NOSTRIL DAILY 16 g 11  . furosemide (LASIX) 20 MG tablet Take 20 mg by mouth every other day. & one extra tablet as needed for weight gain (3 lbs/24 hours or 5 lbs/one week)  or swelling    . ibuprofen (ADVIL,MOTRIN) 200 MG tablet Take 200 mg by mouth every 6 (six) hours as needed.    . megestrol (MEGACE) 40 MG tablet TAKE 1/2 TABLET TWICE DAILY 90 tablet 0  . Multiple Vitamins-Minerals (CENTRUM SILVER) tablet Take 1 tablet by mouth every evening.    . nitroGLYCERIN (NITROSTAT) 0.4 MG SL tablet Place 1 tablet (0.4 mg total) under the tongue every 5 (five) minutes x 3 doses as needed  for chest pain (if no relief after 3rd dose, proceed to the ED for an evaluation or call 911). 25 tablet 3  . simvastatin (ZOCOR) 40 MG tablet TAKE ONE (1) TABLET EACH DAY 90 tablet 0  . spironolactone (ALDACTONE) 25 MG tablet TAKE 1/2 TABLET DAILY 45 tablet 1  . olopatadine (PATADAY) 0.1 % ophthalmic solution Place 1 drop into both eyes daily.      Facility-Administered Medications Prior to Visit  Medication Dose Route Frequency Provider Last Rate Last Admin  . cyanocobalamin ((VITAMIN B-12)) injection 1,000 mcg  1,000 mcg Intramuscular Q30 days Chipper Herb, MD   1,000 mcg at 01/06/19 1039     Review of Systems:   Constitutional:   No  weight loss, night sweats,  Fevers, chills, + fatigue, or  lassitude.  HEENT:   No headaches,  Difficulty swallowing,  Tooth/dental problems, or  Sore throat,                No sneezing, itching, ear ache, nasal  congestion, post nasal drip,   CV:  No chest pain,  Orthopnea, PND, swelling in lower extremities, anasarca, dizziness, palpitations, syncope.   GI  No heartburn, indigestion, abdominal pain, nausea, vomiting, diarrhea, change in bowel habits, loss of appetite, bloody stools.   Resp:   No chest wall deformity  Skin: no rash or lesions.  GU: no dysuria, change in color of urine, no urgency or frequency.  No flank pain, no hematuria   MS:  No joint pain or swelling.  No decreased range of motion.  No back pain.    Physical Exam  BP 114/64 (BP Location: Right Arm, Cuff Size: Normal)   Pulse 72   Temp 97.6 F (36.4 C) (Temporal)   Ht 4\' 11"  (1.499 m)   Wt 99 lb 12.8 oz (45.3 kg)   SpO2 95% Comment: RA  BMI 20.16 kg/m   GEN: A/Ox3; pleasant , NAD, elderly, wc    HEENT:  [place]/AT,   NOSE-clear, THROAT-clear, no lesions, no postnasal drip or exudate noted.   NECK:  Supple w/ fair ROM; no JVD; normal carotid impulses w/o bruits; no thyromegaly or nodules palpated; no lymphadenopathy.    RESP  Bilateral crackles  no accessory muscle  use, no dullness to percussion  CARD:  RRR, no m/r/g, no peripheral edema, pulses intact, no cyanosis or clubbing.  GI:   Soft & nt; nml bowel sounds; no organomegaly or masses detected.   Musco: Warm bil, no deformities or joint swelling noted.   Neuro: alert, no focal deficits noted.    Skin: Warm, no lesions or rashes    Lab Results:   BMET  Imaging: DG Chest 2 View  Result Date: 02/08/2019 CLINICAL DATA:  Follow up pneumothorax. History of pulmonary fibrosis. EXAM: CHEST - 2 VIEW COMPARISON:  CT 01/29/2019. Radiographs 01/11/2019 and 01/01/2019. FINDINGS: The heart size and mediastinal contours are stable with aortic atherosclerosis and tracheal tortuosity. There is no pneumomediastinum. Small left greater than right biapical pneumothoraces are unchanged. There is stable chronic underlying interstitial lung disease consistent with pulmonary fibrosis. The bones appear unremarkable. IMPRESSION: Stable small left greater than right biapical pneumothoraces, present since at least 12/24/2018. No evidence of pneumomediastinum or new findings. Electronically Signed   By: Richardean Sale M.D.   On: 02/08/2019 14:54   DG Chest 2 View  Result Date: 01/12/2019 CLINICAL DATA:  Follow-up pneumothorax EXAM: CHEST - 2 VIEW COMPARISON:  01/01/2019 FINDINGS: No pleural effusion. Coarse reticular opacities bilaterally consistent with chronic interstitial lung disease/fibrosis. Stable cardiomediastinal silhouette with aortic atherosclerosis. Trace left apical pneumothorax without significant change. Small right apical pneumothorax appears slightly decreased. IMPRESSION: 1. No significant change in trace left apical pneumothorax 2. Slightly diminished small right apical pneumothorax 3. Extensive reticular opacity consistent with chronic interstitial lung disease and fibrosis Electronically Signed   By: Donavan Foil M.D.   On: 01/12/2019 00:19   CT Chest High Resolution  Result Date:  01/29/2019 CLINICAL DATA:  86 year old female with history of pulmonary fibrosis. Recent pneumothorax. EXAM: CT CHEST WITHOUT CONTRAST TECHNIQUE: Multidetector CT imaging of the chest was performed following the standard protocol without intravenous contrast. High resolution imaging of the lungs, as well as inspiratory and expiratory imaging, was performed. COMPARISON:  Chest CT 11/06/2018. FINDINGS: Cardiovascular: Heart size is normal. There is no significant pericardial fluid, thickening or pericardial calcification. There is aortic atherosclerosis, as well as atherosclerosis of the great vessels of the mediastinum and the coronary arteries, including calcified atherosclerotic plaque in the left main,  left anterior descending, left circumflex and right coronary arteries. Calcifications of the aortic valve. Mediastinum/Nodes: Small amount of pneumomediastinum adjacent to the distal esophagus. No pathologically enlarged mediastinal or hilar lymph nodes. Please note that accurate exclusion of hilar adenopathy is limited on noncontrast CT scans. Esophagus is unremarkable in appearance. No axillary lymphadenopathy. Lungs/Pleura: Trace left apical pneumothorax. High-resolution images demonstrate widespread areas of peripheral predominant ground-glass attenuation, septal thickening, subpleural reticulation, thickening of the peribronchovascular interstitium, cylindrical and varicose traction bronchiectasis, and scattered areas of honeycombing. These findings have a mild craniocaudal gradient, and appear minimally progressive compared to the prior examination. Inspiratory and expiratory imaging is unremarkable. Upper Abdomen: Aortic atherosclerosis. Musculoskeletal: There are no aggressive appearing lytic or blastic lesions noted in the visualized portions of the skeleton. IMPRESSION: 1. Trace residual left-sided pneumothorax. Small volume of pneumomediastinum adjacent to the distal esophagus. 2. Morphologic changes in  the lungs compatible with interstitial lung disease, with a spectrum of findings considered diagnostic of usual interstitial pneumonia (UIP) per current ATS guidelines. 3. Aortic atherosclerosis, in addition to left main and 3 vessel coronary artery disease. 4. There are calcifications of the aortic valve. Echocardiographic correlation for evaluation of potential valvular dysfunction may be warranted if clinically indicated. Aortic Atherosclerosis (ICD10-I70.0). Electronically Signed   By: Vinnie Langton M.D.   On: 01/29/2019 19:18    cyanocobalamin ((VITAMIN B-12)) injection 1,000 mcg    Date Action Dose Route User   01/06/2019 1039 Given 1,000 mcg Intramuscular (Right Deltoid) Michaela Corner, LPN      No flowsheet data found.  No results found for: NITRICOXIDE      Assessment & Plan:   IPF (idiopathic pulmonary fibrosis) (HCC) Currently stable - continue on current regimen  Plan  Patient Instructions  Continue on BREO daily.  Rinse after use.  Continue on Zyrtec and Flonase As needed   NO heavy lifting. No bending over for prolonged periods of time. Stress reducers as discussed.  COVID 19 vaccine as discussed.  Follow up with Dr. Chase Caller Or Sasan Wilkie in 6 weeks with chest xray  and As needed    Please contact office for sooner follow up if symptoms do not improve or worsen or seek emergency care        Pneumothorax Persistent small pneumothoraces.  Most recent CT chest showed no right-sided pneumothorax with only a trace residual left-sided pneumothorax.  And a small volume pneumomediastinum.  Case was discussed with Dr. Chase Caller.  Patient would not be a surgical candidate due to her severe interstitial lung disease.  She is clinically stable.  O2 saturations are good on room air.  Have advised patient to not do heavy lifting and not bend over for prolonged periods of time.  We will continue to monitor closely.  Chest x-ray today is pending.  We will follow up on return  visit with a chest x-ray.  Plan  Patient Instructions  Continue on BREO daily.  Rinse after use.  Continue on Zyrtec and Flonase As needed   NO heavy lifting. No bending over for prolonged periods of time. Stress reducers as discussed.  COVID 19 vaccine as discussed.  Follow up with Dr. Chase Caller Or Liliana Dang in 6 weeks with chest xray  and As needed    Please contact office for sooner follow up if symptoms do not improve or worsen or seek emergency care           Rexene Edison, NP 02/08/2019

## 2019-02-09 ENCOUNTER — Ambulatory Visit (INDEPENDENT_AMBULATORY_CARE_PROVIDER_SITE_OTHER): Payer: Medicare Other | Admitting: *Deleted

## 2019-02-09 DIAGNOSIS — E538 Deficiency of other specified B group vitamins: Secondary | ICD-10-CM | POA: Diagnosis not present

## 2019-02-16 ENCOUNTER — Other Ambulatory Visit: Payer: Self-pay | Admitting: Family Medicine

## 2019-02-25 ENCOUNTER — Ambulatory Visit (INDEPENDENT_AMBULATORY_CARE_PROVIDER_SITE_OTHER): Payer: Medicare Other | Admitting: Cardiology

## 2019-02-25 ENCOUNTER — Encounter: Payer: Self-pay | Admitting: Cardiology

## 2019-02-25 ENCOUNTER — Other Ambulatory Visit: Payer: Self-pay

## 2019-02-25 VITALS — BP 130/68 | HR 81 | Temp 97.2°F | Ht 60.0 in | Wt 97.0 lb

## 2019-02-25 DIAGNOSIS — J84112 Idiopathic pulmonary fibrosis: Secondary | ICD-10-CM | POA: Diagnosis not present

## 2019-02-25 DIAGNOSIS — I350 Nonrheumatic aortic (valve) stenosis: Secondary | ICD-10-CM

## 2019-02-25 DIAGNOSIS — R627 Adult failure to thrive: Secondary | ICD-10-CM | POA: Diagnosis not present

## 2019-02-25 DIAGNOSIS — I429 Cardiomyopathy, unspecified: Secondary | ICD-10-CM | POA: Diagnosis not present

## 2019-02-25 NOTE — Progress Notes (Signed)
Cardiology Office Note  Date: 02/25/2019   ID: [name], DOB 1925/07/01, MRN [id number]  PCP:  Cathy Norlander, DO  Cardiologist:  Cathy Lesches, MD Electrophysiologist:  None   Chief Complaint  Patient presents with  . Cardiac follow-up    History of Present Illness: [name] is a 86 y.o. female last seen in December 2020 by Ms. Strader PA-C.  She is here today with her granddaughter for a follow-up visit.  Her granddaughter actually works for an adult care home service and is helping her grandmother today as part of that program.  [name] reports poor appetite, has lost a few more pounds.  She is chronically short of breath, no worse than baseline.  She stays at home most of the time during the pandemic.  She does not voice any chest pain, no progressive leg swelling.  Does not sound like she has had to use any as needed Lasix and otherwise continues on fairly simple cardiac regimen including Aldactone, Zocor, as needed nitroglycerin, and aspirin.  She maintains follow-up with Pulmonary, I reviewed the note from January.  She has IPF with recently documented persistent, small pneumothoraces that are being managed conservatively.  Her granddaughter mentions to me that her grandmother does have anxiety about her health, gets fairly upset at times.  She was wondering whether an anxiolytic might be helpful.  I talked to her about even considering a palliative care or hospice evaluation which I think would be very reasonable at this time.  Past Medical History:  Diagnosis Date  . Arthritis   . Cataract   . CHF (congestive heart failure) ([place])    a. EF 35% by echo in 10/2018  . Chronic back pain   . Dry eyes   . Glaucoma   . Hyperlipidemia   . Idiopathic pulmonary fibrosis ([place])   . Lumbar stenosis   . Osteoarthrosis and allied disorders   . Osteoporosis   . Peripheral neuropathy   . Vitamin D deficiency     Past Surgical History:  Procedure Laterality  Date  . ABDOMINAL HYSTERECTOMY    . CATARACT EXTRACTION    . Cataract surgery Bilateral   . Eyelid surgery    . KNEE ARTHROSCOPY    . LUMBAR LAMINECTOMY      Current Outpatient Medications  Medication Sig Dispense Refill  . albuterol (PROVENTIL) (2.5 MG/3ML) 0.083% nebulizer solution NEBULIZE 1 VIAL EVERY 6 HOURS AS NEEDED (Patient taking differently: Take 2.5 mg by nebulization at bedtime. ) 150 mL 5  . alendronate (FOSAMAX) 70 MG tablet TAKE 1 TABLET WEEKLY (TAKE WITH 8OZ OF WATER 30 MINUTES BEFORE BREAKFAST) 12 tablet 2  . aspirin EC 81 MG tablet Take 1 tablet (81 mg total) by mouth daily. 90 tablet 3  . BREO ELLIPTA 100-25 MCG/INH AEPB USE 1 INHALATION DAILY 60 each 2  . busPIRone (BUSPAR) 10 MG tablet TAKE ONE (1) TABLET THREE (3) TIMES EACH DAY 270 tablet 1  . calcium carbonate (OS-CAL) 600 MG TABS tablet Take 600 mg by mouth daily with breakfast.    . cephALEXin (KEFLEX) 500 MG capsule Take 500 mg by mouth daily. Uses as suppressive therapy for bladder infections    . cetirizine (ZYRTEC) 10 MG chewable tablet 1/2 tablet daily     . cholecalciferol (VITAMIN D) 1000 UNITS tablet Take 1,000 Units by mouth daily.    . famotidine (PEPCID) 20 MG tablet Take 20 mg by mouth daily.    . fluticasone (  FLONASE) 50 MCG/ACT nasal spray USE 2 SPRAYS IN EACH NOSTRIL DAILY 16 g 11  . furosemide (LASIX) 20 MG tablet Take 20 mg by mouth every other day. & one extra tablet as needed for weight gain (3 lbs/24 hours or 5 lbs/one week)  or swelling    . ibuprofen (ADVIL,MOTRIN) 200 MG tablet Take 200 mg by mouth every 6 (six) hours as needed.    . megestrol (MEGACE) 40 MG tablet TAKE 1/2 TABLET TWICE DAILY 90 tablet 0  . Multiple Vitamins-Minerals (CENTRUM SILVER) tablet Take 1 tablet by mouth every evening.    . nitroGLYCERIN (NITROSTAT) 0.4 MG SL tablet Place 1 tablet (0.4 mg total) under the tongue every 5 (five) minutes x 3 doses as needed for chest pain (if no relief after 3rd dose, proceed to the ED  for an evaluation or call 911). 25 tablet 3  . simvastatin (ZOCOR) 40 MG tablet TAKE ONE (1) TABLET EACH DAY 90 tablet 0  . spironolactone (ALDACTONE) 25 MG tablet TAKE 1/2 TABLET DAILY 45 tablet 1   Current Facility-Administered Medications  Medication Dose Route Frequency Provider Last Rate Last Admin  . cyanocobalamin ((VITAMIN B-12)) injection 1,000 mcg  1,000 mcg Intramuscular Q30 days Cathy Herb, MD   1,000 mcg at 02/09/19 1114   Allergies:  Levaquin [levaquin leva-pak], Mucinex [guaifenesin er], Bactrim [sulfamethoxazole-trimethoprim], Bentyl [dicyclomine hcl], Macrobid [nitrofurantoin], Yellow dyes (non-tartrazine), Erythromycin, and Penicillins   ROS:  Hearing loss, anxiety, anorexia.  Physical Exam: VS:  BP 130/68   Pulse 81   Temp (!) 97.2 F (36.2 C)   Ht 5' (1.524 m)   Wt 97 lb (44 kg)   BMI 18.94 kg/m , BMI Body mass index is 18.94 kg/m.  Wt Readings from Last 3 Encounters:  02/25/19 97 lb (44 kg)  02/08/19 99 lb 12.8 oz (45.3 kg)  02/01/19 101 lb (45.8 kg)    General: Frail-appearing elderly woman in no acute distress. HEENT: Conjunctiva and lids normal, wearing a mask. Neck: Supple, no elevated JVP. Lungs: Coarse breath sounds, no wheezing. Cardiac: Distant RRR, no gallop.  Soft systolic murmur. Extremities: Trace ankle edema, distal pulses 1+. Musculoskeletal: Kyphosis noted.  ECG:  An ECG dated 03/20/2018 was personally reviewed today and demonstrated:  Sinus rhythm with left bundle branch block.  Recent Labwork: 03/20/2018: B Natriuretic Peptide 575.3 06/22/2018: ALT 12; AST 25; TSH 0.588 10/20/2018: Hemoglobin 13.0; Platelets 281.0; Pro B Natriuretic peptide (BNP) 395.0 11/18/2018: BUN 14; Creatinine, Ser 0.66; Potassium 4.1; Sodium 142     Component Value Date/Time   CHOL 125 06/22/2018 1528   CHOL 158 07/16/2012 1151   TRIG 66 06/22/2018 1528   TRIG 105 06/22/2014 1121   TRIG 134 07/16/2012 1151   HDL 48 06/22/2018 1528   HDL 66 06/22/2014 1121    HDL 57 07/16/2012 1151   CHOLHDL 2.6 06/22/2018 1528   LDLCALC 64 06/22/2018 1528   LDLCALC 76 07/02/2013 1115   LDLCALC 74 07/16/2012 1151    Other Studies Reviewed Today:  Echocardiogram 11/05/2018: 1. Left ventricular ejection fraction, by visual estimation, is 35%. The  left ventricle has moderately decreased function. Normal left ventricular  size. There is mildly increased left ventricular hypertrophy.  2. Abnormal septal motion consistent with left bundle branch block.  3. Left ventricular diastolic Doppler parameters are consistent with  impaired relaxation pattern of LV diastolic filling.  4. Global right ventricle was not well visualized.The right ventricular  size is normal. No increase in right ventricular wall  thickness.  5. Left atrial size was upper normal.  6. Right atrial size was normal.  7. Mild mitral annular calcification.  8. Mild to moderate aortic valve annular calcification.  9. The mitral valve is grossly normal. Trace mitral valve regurgitation.  10. The tricuspid valve is grossly normal. Tricuspid valve regurgitation  is trivial.  11. The aortic valve has an indeterminant number of cusps, possibly  functionally bicuspid and moderately calcified. Valve gradients are not  significantly elevated, but leaflet motion is significantly decreased -  consider low gradient severe aortic  stenosis. Aortic valve regurgitation is mild by color flow Doppler.  12. The pulmonic valve was not well visualized. Pulmonic valve  regurgitation is not visualized by color flow Doppler.  13. Moderately elevated pulmonary artery systolic pressure.  14. The inferior vena cava is normal in size with greater than 50%  respiratory variability, suggesting right atrial pressure of 3 mmHg.   Chest x-ray 02/08/2019: FINDINGS: The heart size and mediastinal contours are stable with aortic atherosclerosis and tracheal tortuosity. There is no pneumomediastinum. Small left  greater than right biapical pneumothoraces are unchanged. There is stable chronic underlying interstitial lung disease consistent with pulmonary fibrosis. The bones appear unremarkable.  IMPRESSION: Stable small left greater than right biapical pneumothoraces, present since at least 12/24/2018. No evidence of pneumomediastinum or new findings.  Assessment and Plan:  1.  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  This is being managed conservatively at this point.  She does not exhibit any significant fluid overload and is currently on Aldactone and has as needed Lasix available.  She has not had to take any Lasix over the last month.  No beta-blocker with left bundle branch block at baseline.  Holding off on ARB/Entresto as well.  She has had lower blood pressures intermittently.  2.  Possible low flow, low gradient aortic stenosis in the setting of cardiomyopathy.  Further work-up is not being pursued.  3.  Idiopathic pulmonary fibrosis with small, persistent pneumothoraces.  She continues to follow with Pulmonary.  4.  Coronary artery calcifications by chest CT imaging.  Continue aspirin and statin.  5.  Failure to thrive, anorexia.  She is on Megace.  I did talk with the patient's granddaughter about considering palliative care or hospice assessment for her grandmother.  This seems to be a very reasonable option at this point, particularly to make sure that her grandmother is more comfortable and less anxious.  She is not a good candidate for further aggressive, invasive work-up of her comorbidities.  She will talk with other family members.  I will forward note to her PCP to help coordinate services as needed.  Medication Adjustments/Labs and Tests Ordered: Current medicines are reviewed at length with the patient today.  Concerns regarding medicines are outlined above.   Tests Ordered: No orders of the defined types were placed in this encounter.   Medication Changes: No orders of  the defined types were placed in this encounter.   Disposition:  Follow up 6 months in the [place] office.  Signed, Satira Sark, MD, [facility] 02/25/2019 4:05 PM    [facility] [address] [place], [place] [address] Phone: [phone]; Fax: [phone]

## 2019-02-25 NOTE — Patient Instructions (Signed)
Medication Instructions:  Your physician recommends that you continue on your current medications as directed. Please refer to the Current Medication list given to you today.  *If you need a refill on your cardiac medications before your next appointment, please call your pharmacy*  Lab Work: Npne If you have labs (blood work) drawn today and your tests are completely normal, you will receive your results only by: Marland Kitchen MyChart Message (if you have MyChart) OR . A paper copy in the mail If you have any lab test that is abnormal or we need to change your treatment, we will call you to review the results.  Testing/Procedures: None  Follow-Up: At [facility], you and your health needs are our priority.  As part of our continuing mission to provide you with exceptional heart care, we have created designated Provider Care Teams.  These Care Teams include your primary Cardiologist (physician) and Advanced Practice Providers (APPs -  Physician Assistants and Nurse Practitioners) who all work together to provide you with the care you need, when you need it.  Your next appointment:   6 month(s)  The format for your next appointment:   In Person  Provider:   [name], MD  Other Instructions None       Thank you for choosing [facility] !

## 2019-02-26 ENCOUNTER — Telehealth: Payer: Self-pay | Admitting: Family Medicine

## 2019-02-26 ENCOUNTER — Ambulatory Visit (INDEPENDENT_AMBULATORY_CARE_PROVIDER_SITE_OTHER): Payer: Medicare Other | Admitting: Family Medicine

## 2019-02-26 DIAGNOSIS — J84112 Idiopathic pulmonary fibrosis: Secondary | ICD-10-CM

## 2019-02-26 DIAGNOSIS — N390 Urinary tract infection, site not specified: Secondary | ICD-10-CM

## 2019-02-26 DIAGNOSIS — R4586 Emotional lability: Secondary | ICD-10-CM

## 2019-02-26 DIAGNOSIS — R627 Adult failure to thrive: Secondary | ICD-10-CM | POA: Diagnosis not present

## 2019-02-26 DIAGNOSIS — R630 Anorexia: Secondary | ICD-10-CM | POA: Diagnosis not present

## 2019-02-26 DIAGNOSIS — R5381 Other malaise: Secondary | ICD-10-CM | POA: Diagnosis not present

## 2019-02-26 DIAGNOSIS — I429 Cardiomyopathy, unspecified: Secondary | ICD-10-CM

## 2019-02-26 MED ORDER — LORAZEPAM 0.5 MG PO TABS: 0.2500 mg | ORAL_TABLET | Freq: Every day | ORAL | 0 refills | Status: AC | PRN

## 2019-02-26 NOTE — Progress Notes (Signed)
Video visit  Subjective: CC: ?UTI PCP: Janora Norlander, DO [id number] [name] is a 86 y.o. female calls for video consult today. Patient provides verbal consent for consult held via video.  Due to COVID-19 pandemic this visit was conducted virtually. This visit type was conducted due to national recommendations for restrictions regarding the COVID-19 Pandemic (e.g. social distancing, sheltering in place) in an effort to limit this patient's exposure and mitigate transmission in our community. All issues noted in this document were discussed and addressed.  A physical exam was not performed with this format.   Location of patient: home Location of provider: [facility] Others present for call: Sonia Baller, niece  1.   Failure to thrive Patient was seen by her cardiologist recently and at that visit, he was concerned about her overall health and failure to thrive.  There was discussion for consideration of palliative care and/or hospice given her deterioration.  She is not a good candidate for aggressive therapies with regards to her heart given frailty and other comorbidities.  This has not been further discussed amongst the family.  Sonia Baller reports that patient has been having mood swings and outbursts of anger, panic.  This occurs 2-3 times per week.  This was discussed with her cardiologist and they thought that she may benefit from a low-dose Ativan as needed.  She has been more confused lately.  She sometimes will have this with UTIs so she wants to get her urine checked.  She is maintenance Keflex but sometimes has infection on this.  She continues to have quite a bit of difficulty getting around due to generalized weakness, shortness of breath with activity.  ROS: Per HPI  Allergies  Allergen Reactions  . Levaquin [Levaquin Leva-Pak] Shortness Of Breath and Palpitations    Heart palpitations, shortness of breath  . Mucinex [Guaifenesin Er] Other (See Comments)    Causes anxiety  . Bactrim  [Sulfamethoxazole-Trimethoprim]     Caused her to "feel sick" and resulted in dehydration  . Bentyl [Dicyclomine Hcl] Other (See Comments)    shakes  . Macrobid [Nitrofurantoin]     Not recommended given interstitial lung disease  . Yellow Dyes (Non-Tartrazine) Other (See Comments)    unknown  . Erythromycin Diarrhea, Nausea Only and Rash  . Penicillins Rash    Did it involve swelling of the face/tongue/throat, SOB, or low BP? No Did it involve sudden or severe rash/hives, skin peeling, or any reaction on the inside of your mouth or nose? No Did you need to seek medical attention at a hospital or doctor's office? No When did it last happen? If all above answers are "NO", may proceed with cephalosporin use.   Past Medical History:  Diagnosis Date  . Arthritis   . Cataract   . CHF (congestive heart failure) ([place])    a. EF 35% by echo in 10/2018  . Chronic back pain   . Dry eyes   . Glaucoma   . Hyperlipidemia   . Idiopathic pulmonary fibrosis ([place])   . Lumbar stenosis   . Osteoarthrosis and allied disorders   . Osteoporosis   . Peripheral neuropathy   . Vitamin D deficiency     Current Outpatient Medications:  .  albuterol (PROVENTIL) (2.5 MG/3ML) 0.083% nebulizer solution, NEBULIZE 1 VIAL EVERY 6 HOURS AS NEEDED (Patient taking differently: Take 2.5 mg by nebulization at bedtime. ), Disp: 150 mL, Rfl: 5 .  alendronate (FOSAMAX) 70 MG tablet, TAKE 1 TABLET WEEKLY (TAKE WITH 8OZ OF WATER  [place]), Disp: 12 tablet, Rfl: 2 .  aspirin EC 81 MG tablet, Take 1 tablet (81 mg total) by mouth daily., Disp: 90 tablet, Rfl: 3 .  BREO ELLIPTA 100-25 MCG/INH AEPB, USE 1 INHALATION DAILY, Disp: 60 each, Rfl: 2 .  busPIRone (BUSPAR) 10 MG tablet, TAKE ONE (1) TABLET THREE (3) TIMES EACH DAY, Disp: 270 tablet, Rfl: 1 .  calcium carbonate (OS-CAL) 600 MG TABS tablet, Take 600 mg by mouth daily with breakfast., Disp: , Rfl:  .  cephALEXin (KEFLEX) 500 MG capsule, Take  500 mg by mouth daily. Uses as suppressive therapy for bladder infections, Disp: , Rfl:  .  cetirizine (ZYRTEC) 10 MG chewable tablet, 1/2 tablet daily , Disp: , Rfl:  .  cholecalciferol (VITAMIN D) 1000 UNITS tablet, Take 1,000 Units by mouth daily., Disp: , Rfl:  .  famotidine (PEPCID) 20 MG tablet, Take 20 mg by mouth daily., Disp: , Rfl:  .  fluticasone (FLONASE) 50 MCG/ACT nasal spray, USE 2 SPRAYS IN EACH NOSTRIL DAILY, Disp: 16 g, Rfl: 11 .  furosemide (LASIX) 20 MG tablet, Take 20 mg by mouth every other day. & one extra tablet as needed for weight gain (3 lbs/24 hours or 5 lbs/one week)  or swelling, Disp: , Rfl:  .  ibuprofen (ADVIL,MOTRIN) 200 MG tablet, Take 200 mg by mouth every 6 (six) hours as needed., Disp: , Rfl:  .  megestrol (MEGACE) 40 MG tablet, TAKE 1/2 TABLET TWICE DAILY, Disp: 90 tablet, Rfl: 0 .  Multiple Vitamins-Minerals (CENTRUM SILVER) tablet, Take 1 tablet by mouth every evening., Disp: , Rfl:  .  nitroGLYCERIN (NITROSTAT) 0.4 MG SL tablet, Place 1 tablet (0.4 mg total) under the tongue every 5 (five) minutes x 3 doses as needed for chest pain (if no relief after 3rd dose, proceed to the ED for an evaluation or call 911)., Disp: 25 tablet, Rfl: 3 .  simvastatin (ZOCOR) 40 MG tablet, TAKE ONE (1) TABLET EACH DAY, Disp: 90 tablet, Rfl: 0 .  spironolactone (ALDACTONE) 25 MG tablet, TAKE 1/2 TABLET DAILY, Disp: 45 tablet, Rfl: 1  Current Facility-Administered Medications:  .  cyanocobalamin ((VITAMIN B-12)) injection 1,000 mcg, 1,000 mcg, Intramuscular, Q30 days, Chipper Herb, MD, 1,000 mcg at 02/09/19 1114  Assessment/ Plan: 86 y.o. female   1. Failure to thrive in adult We discussed her office visit with cardiology today including recommendations for consideration for palliative care and/or hospice care.  It does not sound like the family is quite ready to discuss this at this time.  Though they do recognize she does need quite a bit more care. - Ambulatory  referral to Home Health  2. Anorexia I think she would benefit from weight checks - Ambulatory referral to [place] [facility]  3. Physical deconditioning We will try and get her some strengthening exercises with home health physical therapy.  This is been ordered today - Ambulatory referral to [place]  4. IPF (idiopathic pulmonary fibrosis) ([place]) - Ambulatory referral to Home Health  5. Secondary cardiomyopathy ([place]) - Ambulatory referral to Home Health  6. Mood disturbance I have added a small amount of Ativan to have as needed.  We discussed the risks of this medication.  This is only to be administered under supervision and never more than a couple of times per week since this is when these flares are happening.  If her mood disturbance becomes more frequent, may need to consider mood stabilizer.  National narcotic database was  reviewed and there were no red flags - Urinalysis, Complete - Urine Culture - LORazepam (ATIVAN) 0.5 MG tablet; Take 0.5 tablets (0.25 mg total) by mouth daily as needed for anxiety or sedation.  Dispense: 15 tablet; Refill: 0  7. Frequent UTI Check UA.  Granddaughter will bring this in on Monday.  She understands that if symptoms worsen over the weekend that she is to seek immediate medical attention. - Urinalysis, Complete - Urine Culture   Start time: 3:48pm End time: 4:08pm  Total time spent on patient care (including telephone call/ virtual visit): 35 minutes  [place], [place] [phone]

## 2019-03-01 ENCOUNTER — Other Ambulatory Visit: Payer: Medicare Other

## 2019-03-01 ENCOUNTER — Other Ambulatory Visit: Payer: Self-pay | Admitting: Family Medicine

## 2019-03-01 DIAGNOSIS — N390 Urinary tract infection, site not specified: Secondary | ICD-10-CM

## 2019-03-01 LAB — MICROSCOPIC EXAMINATION
Epithelial Cells (non renal): >10 /hpf — AB (ref 0–10)
Renal Epithel, UA: NONE SEEN /hpf

## 2019-03-01 LAB — URINALYSIS, COMPLETE
Bilirubin, UA: NEGATIVE
Glucose, UA: NEGATIVE
Ketones, UA: NEGATIVE
Nitrite, UA: NEGATIVE
RBC, UA: NEGATIVE
Specific Gravity, UA: 1.025 (ref 1.005–1.030)
Urobilinogen, Ur: 2 mg/dL — ABNORMAL HIGH (ref 0.2–1.0)
pH, UA: 6.5 (ref 5.0–7.5)

## 2019-03-01 MED ORDER — CEPHALEXIN 500 MG PO CAPS: 500.0000 mg | ORAL_CAPSULE | Freq: Two times a day (BID) | ORAL | 0 refills | Status: AC

## 2019-03-01 NOTE — Telephone Encounter (Signed)
Spoke to daughter. No change needed.

## 2019-03-02 ENCOUNTER — Telehealth: Payer: Self-pay | Admitting: Family Medicine

## 2019-03-02 NOTE — Telephone Encounter (Signed)
Left message to call back  

## 2019-03-05 ENCOUNTER — Other Ambulatory Visit: Payer: Self-pay | Admitting: Family Medicine

## 2019-03-05 LAB — URINE CULTURE

## 2019-03-08 ENCOUNTER — Other Ambulatory Visit: Payer: Self-pay | Admitting: Family Medicine

## 2019-03-08 MED ORDER — CEFDINIR 300 MG PO CAPS: 300.0000 mg | ORAL_CAPSULE | Freq: Two times a day (BID) | ORAL | 0 refills | Status: DC

## 2019-03-09 NOTE — Telephone Encounter (Signed)
Never returned call, encounter closed.

## 2019-03-11 ENCOUNTER — Telehealth: Payer: Self-pay | Admitting: Internal Medicine

## 2019-03-11 NOTE — Telephone Encounter (Signed)
HRCT with small ptx on left side and advanced pulmonary fibrosis.   Plan  - observation Rx only     SIGNATURE    Dr. Brand Males, M.D., F.C.C.P,  Pulmonary and Critical Care Medicine Staff Physician, [facility] Director - Interstitial Lung Disease  Program  Pulmonary [facility] [place], [name], [address]  Pager: [phone], If no answer or between  15:00h - 7:00h: call [phone] Telephone: [phone]  9:58 PM 03/11/2019  xxxxxxxxxxxxxxxxxxx    IMPRESSION: 1. Trace residual left-sided pneumothorax. Small volume of pneumomediastinum adjacent to the distal esophagus. 2. Morphologic changes in the lungs compatible with interstitial lung disease, with a spectrum of findings considered diagnostic of usual interstitial pneumonia (UIP) per current ATS guidelines. 3. Aortic atherosclerosis, in addition to left main and 3 vessel coronary artery disease. 4. There are calcifications of the aortic valve. Echocardiographic correlation for evaluation of potential valvular dysfunction may be warranted if clinically indicated.  Aortic Atherosclerosis (ICD10-I70.0).   Electronically Signed   By: Vinnie Langton M.D.   On: 01/29/2019 19:18

## 2019-03-12 ENCOUNTER — Ambulatory Visit: Payer: Medicare Other

## 2019-03-15 ENCOUNTER — Other Ambulatory Visit: Payer: Self-pay

## 2019-03-16 ENCOUNTER — Ambulatory Visit (INDEPENDENT_AMBULATORY_CARE_PROVIDER_SITE_OTHER): Payer: Medicare Other | Admitting: Family Medicine

## 2019-03-16 ENCOUNTER — Encounter: Payer: Self-pay | Admitting: Family Medicine

## 2019-03-16 VITALS — BP 120/69 | HR 58 | Temp 97.7°F | Ht 60.0 in | Wt 95.0 lb

## 2019-03-16 DIAGNOSIS — E538 Deficiency of other specified B group vitamins: Secondary | ICD-10-CM

## 2019-03-16 DIAGNOSIS — R1310 Dysphagia, unspecified: Secondary | ICD-10-CM

## 2019-03-16 DIAGNOSIS — R630 Anorexia: Secondary | ICD-10-CM | POA: Diagnosis not present

## 2019-03-16 DIAGNOSIS — F410 Panic disorder [episodic paroxysmal anxiety] without agoraphobia: Secondary | ICD-10-CM

## 2019-03-16 DIAGNOSIS — R627 Adult failure to thrive: Secondary | ICD-10-CM | POA: Diagnosis not present

## 2019-03-16 DIAGNOSIS — R4189 Other symptoms and signs involving cognitive functions and awareness: Secondary | ICD-10-CM

## 2019-03-16 DIAGNOSIS — R5381 Other malaise: Secondary | ICD-10-CM | POA: Diagnosis not present

## 2019-03-16 DIAGNOSIS — J84112 Idiopathic pulmonary fibrosis: Secondary | ICD-10-CM

## 2019-03-16 DIAGNOSIS — Z87440 Personal history of urinary (tract) infections: Secondary | ICD-10-CM

## 2019-03-16 DIAGNOSIS — R4586 Emotional lability: Secondary | ICD-10-CM

## 2019-03-16 DIAGNOSIS — F419 Anxiety disorder, unspecified: Secondary | ICD-10-CM

## 2019-03-16 MED ORDER — BUSPIRONE HCL 10 MG PO TABS: ORAL_TABLET | ORAL | 1 refills | Status: AC

## 2019-03-16 NOTE — Progress Notes (Signed)
Subjective: CC: Failure to thrive PCP: Janora Norlander, DO SHF:[id number] RITI ROLLYSON is a 86 y.o. female presenting to clinic today for:  1.  Failure to thrive/anorexia Patient with ongoing failure to thrive.  She was last seen via video visit about 2 weeks ago.  There was concern by cardiology for ongoing failure to thrive and maximization of current cardiac therapies.  Consideration for palliative medicine was discussed but at last visit this was not something they were willing to pursue just yet.  They did notice some intermittent mood disturbances and low-dose Ativan was started for as needed use.  Her caregiver notes that this has not been needed at all since our last visit.  She continues to have concerns about her mental status.  She notes waxing and waning memory and mood.  She worries about dementia in this patient.  She also notes that she continues not to really eat well.  The patient notes that she drinks maybe 1 boost per day and eats oatmeal each morning with supper in the evenings.  She is not motivated to have lunch or other snacks because "nobody is there with her to encourage her to eat".  Her caregiver notes that she has had some dysphagia and choking episodes but not sure if this is related to liquids or solids.  They are interested in pursuing evaluation for this.  ROS: Per HPI  Allergies  Allergen Reactions  . Levaquin [Levaquin Leva-Pak] Shortness Of Breath and Palpitations    Heart palpitations, shortness of breath  . Mucinex [Guaifenesin Er] Other (See Comments)    Causes anxiety  . Bactrim [Sulfamethoxazole-Trimethoprim]     Caused her to "feel sick" and resulted in dehydration  . Bentyl [Dicyclomine Hcl] Other (See Comments)    shakes  . Macrobid [Nitrofurantoin]     Not recommended given interstitial lung disease  . Yellow Dyes (Non-Tartrazine) Other (See Comments)    unknown  . Erythromycin Diarrhea, Nausea Only and Rash  . Penicillins Rash    Did it  involve swelling of the face/tongue/throat, SOB, or low BP? No Did it involve sudden or severe rash/hives, skin peeling, or any reaction on the inside of your mouth or nose? No Did you need to seek medical attention at a hospital or doctor's office? No When did it last happen? If all above answers are "NO", may proceed with cephalosporin use.   Past Medical History:  Diagnosis Date  . Arthritis   . Cataract   . CHF (congestive heart failure) ([place])    a. EF 35% by echo in 10/2018  . Chronic back pain   . Dry eyes   . Glaucoma   . Hyperlipidemia   . Idiopathic pulmonary fibrosis ([place])   . Lumbar stenosis   . Osteoarthrosis and allied disorders   . Osteoporosis   . Peripheral neuropathy   . Vitamin D deficiency     Current Outpatient Medications:  .  albuterol (PROVENTIL) (2.5 MG/3ML) 0.083% nebulizer solution, NEBULIZE 1 VIAL EVERY 6 HOURS AS NEEDED (Patient taking differently: Take 2.5 mg by nebulization at bedtime. ), Disp: 150 mL, Rfl: 5 .  alendronate (FOSAMAX) 70 MG tablet, TAKE 1 TABLET WEEKLY (TAKE WITH 8OZ OF WATER 30 MINUTES BEFORE BREAKFAST), Disp: 12 tablet, Rfl: 2 .  aspirin EC 81 MG tablet, Take 1 tablet (81 mg total) by mouth daily., Disp: 90 tablet, Rfl: 3 .  BREO ELLIPTA 100-25 MCG/INH AEPB, USE 1 INHALATION DAILY, Disp: 60 each, Rfl: 2 .  busPIRone (BUSPAR) 10 MG tablet, TAKE ONE (1) TABLET THREE (3) TIMES EACH DAY, Disp: 270 tablet, Rfl: 1 .  calcium carbonate (OS-CAL) 600 MG TABS tablet, Take 600 mg by mouth daily with breakfast., Disp: , Rfl:  .  cefdinir (OMNICEF) 300 MG capsule, Take 1 capsule (300 mg total) by mouth 2 (two) times daily. 1 po BID, Disp: 20 capsule, Rfl: 0 .  cephALEXin (KEFLEX) 500 MG capsule, Take 500 mg by mouth daily. Uses as suppressive therapy for bladder infections, Disp: , Rfl:  .  cetirizine (ZYRTEC) 10 MG chewable tablet, 1/2 tablet daily , Disp: , Rfl:  .  cholecalciferol (VITAMIN D) 1000 UNITS tablet, Take 1,000 Units by mouth  daily., Disp: , Rfl:  .  famotidine (PEPCID) 20 MG tablet, Take 20 mg by mouth daily., Disp: , Rfl:  .  fluticasone (FLONASE) 50 MCG/ACT nasal spray, USE 2 SPRAYS IN EACH NOSTRIL DAILY, Disp: 16 g, Rfl: 11 .  furosemide (LASIX) 20 MG tablet, Take 20 mg by mouth every other day. & one extra tablet as needed for weight gain (3 lbs/24 hours or 5 lbs/one week)  or swelling, Disp: , Rfl:  .  ibuprofen (ADVIL,MOTRIN) 200 MG tablet, Take 200 mg by mouth every 6 (six) hours as needed., Disp: , Rfl:  .  LORazepam (ATIVAN) 0.5 MG tablet, Take 0.5 tablets (0.25 mg total) by mouth daily as needed for anxiety or sedation., Disp: 15 tablet, Rfl: 0 .  megestrol (MEGACE) 40 MG tablet, TAKE 1/2 TABLET TWICE DAILY, Disp: 90 tablet, Rfl: 0 .  Multiple Vitamins-Minerals (CENTRUM SILVER) tablet, Take 1 tablet by mouth every evening., Disp: , Rfl:  .  nitroGLYCERIN (NITROSTAT) 0.4 MG SL tablet, Place 1 tablet (0.4 mg total) under the tongue every 5 (five) minutes x 3 doses as needed for chest pain (if no relief after 3rd dose, proceed to the ED for an evaluation or call 911)., Disp: 25 tablet, Rfl: 3 .  simvastatin (ZOCOR) 40 MG tablet, TAKE ONE (1) TABLET EACH DAY, Disp: 90 tablet, Rfl: 0 .  spironolactone (ALDACTONE) 25 MG tablet, TAKE 1/2 TABLET DAILY, Disp: 45 tablet, Rfl: 1  Current Facility-Administered Medications:  .  cyanocobalamin ((VITAMIN B-12)) injection 1,000 mcg, 1,000 mcg, Intramuscular, Q30 days, Chipper Herb, MD, 1,000 mcg at 02/09/19 1114 Social History   Socioeconomic History  . Marital status: Widowed    Spouse name: Not on file  . Number of children: Not on file  . Years of education: 5  . Highest education level: Not on file  Occupational History  . Not on file  Tobacco Use  . Smoking status: Never Smoker  . Smokeless tobacco: Current User    Types: Chew  Substance and Sexual Activity  . Alcohol use: No    Alcohol/week: 0.0 standard drinks  . Drug use: No  . Sexual activity: Not  Currently    Birth control/protection: Post-menopausal  Other Topics Concern  . Not on file  Social History Narrative  . Not on file   Social Determinants of Health   Financial Resource Strain:   . Difficulty of Paying Living Expenses: Not on file  Food Insecurity:   . Worried About Charity fundraiser in the Last Year: Not on file  . Ran Out of Food in the Last Year: Not on file  Transportation Needs:   . Lack of Transportation (Medical): Not on file  . Lack of Transportation (Non-Medical): Not on file  Physical Activity:   .  Days of Exercise per Week: Not on file  . Minutes of Exercise per Session: Not on file  Stress:   . Feeling of Stress : Not on file  Social Connections:   . Frequency of Communication with Friends and Family: Not on file  . Frequency of Social Gatherings with Friends and Family: Not on file  . Attends Religious Services: Not on file  . Active Member of Clubs or Organizations: Not on file  . Attends Archivist Meetings: Not on file  . Marital Status: Not on file  Intimate Partner Violence:   . Fear of Current or Ex-Partner: Not on file  . Emotionally Abused: Not on file  . Physically Abused: Not on file  . Sexually Abused: Not on file   Family History  Problem Relation Age of Onset  . Bone cancer Mother   . Breast cancer Sister   . Diabetes Brother   . Asthma Daughter   . COPD Son   . Asthma Son     Objective: Office vital signs reviewed. BP 120/69   Pulse (!) 58   Temp 97.7 F (36.5 C) (Temporal)   Ht 5' (1.524 m)   Wt 95 lb (43.1 kg)   HC 99" (251.5 cm)   BMI 18.55 kg/m   Physical Examination:  General: Awake, alert, malnourished, cachetic with bilateral temporal wasting. No acute distress HEENT: dry mucus membranes Cardio: regular rate and rhythm, S1S2 heard, no murmurs appreciated Pulm: mildly labored breathing. No dyspnea with speech. Extremities: warm MSK: poor tone, arrives in wheelchair Skin: dry; intact; no  rashes or lesions Neuro: see MMSE MMSE - Mini Mental State Exam 03/16/2019 10/03/2017 07/23/2016  Orientation to time 0 5 5  Orientation to Place '4 5 5  '$ Registration '3 3 3  '$ Attention/ Calculation '3 3 5  '$ Recall '3 1 1  '$ Language- name 2 objects '2 2 2  '$ Language- repeat 0 1 0  Language- follow 3 step command '1 3 3  '$ Language- read & follow direction '1 1 1  '$ Write a sentence '1 1 1  '$ Copy design 1 0 0  Total score '19 25 26   '$ Assessment/ Plan: 86 y.o. female   1. Failure to thrive in adult Ongoing weight loss.  She is down 6 pounds since her last in office visit.  This is despite Megace.  I question if some of the dysphagia and dementia are impacting.  I have encouraged family again to consider palliative medicine given her ongoing deterioration.  We will go ahead and reach out to CCM to see if perhaps Meals on Wheels may be available to patient my encouraged her to eat.  I again reinforced need for increased caloric intake. - CMP14+EGFR - TSH - CBC - Ambulatory referral to Gastroenterology - Referral to Chronic Care Management Services  2. Anorexia - CMP14+EGFR - TSH - CBC - Referral to Chronic Care Management Services  3. Physical deconditioning - CMP14+EGFR - CBC - Referral to Chronic Care Management Services  4. IPF (idiopathic pulmonary fibrosis) ([facility]) - CBC  5. Recent urinary tract infection - Urinalysis, Complete  6. Mood disturbance - TSH - ToxASSURE Select 13 (MW), Urine; Future  7. Anxiety - busPIRone (BUSPAR) 10 MG tablet; TAKE ONE (1) TABLET THREE (3) TIMES EACH DAY  Dispense: 270 tablet; Refill: 1  8. Panic attack - busPIRone (BUSPAR) 10 MG tablet; TAKE ONE (1) TABLET THREE (3) TIMES EACH DAY  Dispense: 270 tablet; Refill: 1  9. Dysphagia, unspecified type -  Ambulatory referral to Gastroenterology - Referral to Chronic Care Management Services  10. Cognitive impairment Moderate degree of dementia noted with MMSE score down to 19.   No orders of the defined  types were placed in this encounter.  No orders of the defined types were placed in this encounter.    Janora Norlander, DO [phone]

## 2019-03-16 NOTE — Patient Instructions (Addendum)
I am very concerned about her ongoing weight loss.  She is down another 6lbs since our visit in January.  I think her body is shutting down on her, despite our best efforts.  I do think another discussion about palliative medicine evaluation is warranted amongst the family.  Discuss with her urologist if making a change to Cefdinir is a good idea.  I think it might be but would recommend their input.  She is prescribed Buspar 10mg  three times daily.  I have referred her to GI for swallow study.  I will also try and add speech therapy to her home health.  You had labs performed today.  You will be contacted with the results of the labs once they are available, usually in the next 3 business days for routine lab work.  If you have an active my chart account, they will be released to your MyChart.  If you prefer to have these labs released to you via telephone, please let us know.   Dementia Caregiver Guide Dementia is a term used to describe a number of symptoms that affect memory and thinking. The most common symptoms include:  Memory loss.  Trouble with language and communication.  Trouble concentrating.  Poor judgment.  Problems with reasoning.  Child-like behavior and language.  Extreme anxiety.  Angry outbursts.  Wandering from home or public places. Dementia usually gets worse slowly over time. In the early stages, people with dementia can stay independent and safe with some help. In later stages, they need help with daily tasks such as dressing, grooming, and using the bathroom. How to help the person with dementia cope Dementia can be frightening and confusing. Here are some tips to help the person with dementia cope with changes caused by the disease. General tips  Keep the person on track with his or her routine.  Try to identify areas where the person may need help.  Be supportive, patient, calm, and encouraging.  Gently remind the person that adjusting to changes  takes time.  Help with the tasks that the person has asked for help with.  Keep the person involved in daily tasks and decisions as much as possible.  Encourage conversation, but try not to get frustrated or harried if the person struggles to find words or does not seem to appreciate your help. Communication tips  When the person is talking or seems frustrated, make eye contact and hold the person's hand.  Ask specific questions that need yes or no answers.  Use simple words, short sentences, and a calm voice. Only give one direction at a time.  When offering choices, limit them to just 1 or 2.  Avoid correcting the person in a negative way.  If the person is struggling to find the right words, gently try to help him or her. How to recognize symptoms of stress Symptoms of stress in caregivers include:  Feeling frustrated or angry with the person with dementia.  Denying that the person has dementia or that his or her symptoms will not improve.  Feeling hopeless and unappreciated.  Difficulty sleeping.  Difficulty concentrating.  Feeling anxious, irritable, or depressed.  Developing stress-related health problems.  Feeling like you have too little time for your own life. Follow these instructions at home:   Make sure that you and the person you are caring for: ? Get regular sleep. ? Exercise regularly. ? Eat regular, nutritious meals. ? Drink enough fluid to keep your urine clear or pale yellow. ?  Take over-the-counter and prescription medicines only as told by your health care providers. ? Attend all scheduled health care appointments.  Join a support group with others who are caregivers.  Ask about respite care resources so that you can have a regular break from the stress of caregiving.  Look for signs of stress in yourself and in the person you are caring for. If you notice signs of stress, take steps to manage it.  Consider any safety risks and take steps to  avoid them.  Organize medications in a pill box for each day of the week.  Create a plan to handle any legal or financial matters. Get legal or financial advice if needed.  Keep a calendar in a central location to remind the person of appointments or other activities. Tips for reducing the risk of injury  Keep floors clear of clutter. Remove rugs, magazine racks, and floor lamps.  Keep hallways well lit, especially at night.  Put a handrail and nonslip mat in the bathtub or shower.  Put childproof locks on cabinets that contain dangerous items, such as medicines, alcohol, guns, toxic cleaning items, sharp tools or utensils, matches, and lighters.  Put the locks in places where the person cannot see or reach them easily. This will help ensure that the person does not wander out of the house and get lost.  Be prepared for emergencies. Keep a list of emergency phone numbers and addresses in a convenient area.  Remove car keys and lock garage doors so that the person does not try to get in the car and drive.  Have the person wear a bracelet that tracks locations and identifies the person as having memory problems. This should be worn at all times for safety. Where to find support: Many individuals and organizations offer support. These include:  Support groups for people with dementia and for caregivers.  Counselors or therapists.  Home health care services.  Adult day care centers. Where to find more information Alzheimer's Association: [employer] Contact a health care provider if:  The person's health is rapidly getting worse.  You are no longer able to care for the person.  Caring for the person is affecting your physical and emotional health.  The person threatens himself or herself, you, or anyone else. Summary  Dementia is a term used to describe a number of symptoms that affect memory and thinking.  Dementia usually gets worse slowly over time.  Take steps to  reduce the person's risk of injury, and to plan for future care.  Caregivers need support, relief from caregiving, and time for their own lives. This information is not intended to replace advice given to you by your health care provider. Make sure you discuss any questions you have with your health care provider. Document Revised: 12/13/2016 Document Reviewed: 12/05/2015 Elsevier Patient Education  2020 [employer].

## 2019-03-16 NOTE — Telephone Encounter (Signed)
Pt had OV with TP 1/25 and results of the HRCT were discussed with pt during that visit. Nothing further needed.

## 2019-03-17 ENCOUNTER — Telehealth: Payer: Self-pay | Admitting: Family Medicine

## 2019-03-17 ENCOUNTER — Other Ambulatory Visit: Payer: Medicare Other

## 2019-03-17 ENCOUNTER — Encounter: Payer: Self-pay | Admitting: Family Medicine

## 2019-03-17 ENCOUNTER — Other Ambulatory Visit: Payer: Self-pay | Admitting: Family Medicine

## 2019-03-17 DIAGNOSIS — N390 Urinary tract infection, site not specified: Secondary | ICD-10-CM

## 2019-03-17 DIAGNOSIS — R4586 Emotional lability: Secondary | ICD-10-CM

## 2019-03-17 LAB — CMP14+EGFR
ALT: 11 IU/L (ref 0–32)
AST: 24 IU/L (ref 0–40)
Albumin/Globulin Ratio: 1.7 (ref 1.2–2.2)
Albumin: 4 g/dL (ref 3.5–4.6)
Alkaline Phosphatase: 68 IU/L (ref 39–117)
BUN/Creatinine Ratio: 28 (ref 12–28)
BUN: 19 mg/dL (ref 10–36)
Bilirubin Total: 0.3 mg/dL (ref 0.0–1.2)
CO2: 30 mmol/L — ABNORMAL HIGH (ref 20–29)
Calcium: 9.4 mg/dL (ref 8.7–10.3)
Chloride: 99 mmol/L (ref 96–106)
Creatinine, Ser: 0.69 mg/dL (ref 0.57–1.00)
GFR calc Af Amer: 88 mL/min/{1.73_m2} (ref >59)
GFR calc non Af Amer: 76 mL/min/{1.73_m2} (ref >59)
Globulin, Total: 2.4 g/dL (ref 1.5–4.5)
Glucose: 103 mg/dL — ABNORMAL HIGH (ref 65–99)
Potassium: 4.8 mmol/L (ref 3.5–5.2)
Sodium: 141 mmol/L (ref 134–144)
Total Protein: 6.4 g/dL (ref 6.0–8.5)

## 2019-03-17 LAB — CBC
Hematocrit: 44.7 % (ref 34.0–46.6)
Hemoglobin: 14.4 g/dL (ref 11.1–15.9)
MCH: 28.5 pg (ref 26.6–33.0)
MCHC: 32.2 g/dL (ref 31.5–35.7)
MCV: 88 fL (ref 79–97)
Platelets: 261 10*3/uL (ref 150–450)
RBC: 5.06 x10E6/uL (ref 3.77–5.28)
RDW: 12.1 % (ref 11.7–15.4)
WBC: 6 10*3/uL (ref 3.4–10.8)

## 2019-03-17 LAB — TSH: TSH: 0.806 u[IU]/mL (ref 0.450–4.500)

## 2019-03-17 MED ORDER — TRIMETHOPRIM 100 MG PO TABS: 100.0000 mg | ORAL_TABLET | Freq: Every day | ORAL | 0 refills | Status: AC

## 2019-03-17 NOTE — Progress Notes (Signed)
Lmtcb.

## 2019-03-17 NOTE — Progress Notes (Signed)
Daughter aware.

## 2019-03-17 NOTE — Progress Notes (Signed)
Please inform patient's daughter/ family member that Dr. Jeffie Pollock recommended her going back on trimethoprim 100 mg daily INSTEAD of keflex.  This does not contain the sulfa portion that she had issues with before.

## 2019-03-17 NOTE — Telephone Encounter (Signed)
I'm fine with increased hours.  She has dementia and certainly could use help given her limited mobility and frailty

## 2019-03-17 NOTE — Telephone Encounter (Signed)
Form filled out and placed on providers desk

## 2019-03-18 ENCOUNTER — Encounter: Payer: Self-pay | Admitting: Internal Medicine

## 2019-03-18 NOTE — Telephone Encounter (Signed)
Please call back Daughter [name] about med change at work

## 2019-03-18 NOTE — Telephone Encounter (Signed)
Aware of medication change- refer to last phone note.

## 2019-03-19 ENCOUNTER — Telehealth: Payer: Self-pay | Admitting: *Deleted

## 2019-03-19 NOTE — Telephone Encounter (Signed)
VM from [place] w/ [facility] a modified barium swallow study with a speech language pathologist, not sure what facility ([facility]) does this

## 2019-03-19 NOTE — Telephone Encounter (Signed)
Form faxed to [employer]

## 2019-03-19 NOTE — Telephone Encounter (Signed)
She said this is different than what is with a GI that is why she said with a speech language pathologist

## 2019-03-19 NOTE — Telephone Encounter (Signed)
Patient was referred to GI.  They should be able to arrange this for her.

## 2019-03-20 ENCOUNTER — Other Ambulatory Visit: Payer: Self-pay | Admitting: Family Medicine

## 2019-03-22 NOTE — Telephone Encounter (Signed)
Written order placed on providers desk for referral to fax to [facility] for scheduling

## 2019-03-22 NOTE — Addendum Note (Signed)
Addended by: Antonietta Barcelona D on: 03/22/2019 12:49 PM   Modules accepted: Orders

## 2019-03-22 NOTE — Telephone Encounter (Signed)
If we can figure out who I need to refer to, I will be glad to place order.

## 2019-03-22 NOTE — Addendum Note (Signed)
Addended by: Antonietta Barcelona D on: 03/22/2019 12:00 PM   Modules accepted: Orders

## 2019-03-23 ENCOUNTER — Telehealth: Payer: Self-pay

## 2019-03-23 ENCOUNTER — Ambulatory Visit (INDEPENDENT_AMBULATORY_CARE_PROVIDER_SITE_OTHER): Payer: Medicare Other

## 2019-03-23 ENCOUNTER — Other Ambulatory Visit: Payer: Self-pay

## 2019-03-23 DIAGNOSIS — R1310 Dysphagia, unspecified: Secondary | ICD-10-CM

## 2019-03-23 DIAGNOSIS — I429 Cardiomyopathy, unspecified: Secondary | ICD-10-CM | POA: Diagnosis not present

## 2019-03-23 DIAGNOSIS — R630 Anorexia: Secondary | ICD-10-CM | POA: Diagnosis not present

## 2019-03-23 DIAGNOSIS — M48061 Spinal stenosis, lumbar region without neurogenic claudication: Secondary | ICD-10-CM

## 2019-03-23 DIAGNOSIS — M810 Age-related osteoporosis without current pathological fracture: Secondary | ICD-10-CM

## 2019-03-23 DIAGNOSIS — J84112 Idiopathic pulmonary fibrosis: Secondary | ICD-10-CM

## 2019-03-23 DIAGNOSIS — R627 Adult failure to thrive: Secondary | ICD-10-CM

## 2019-03-23 DIAGNOSIS — R5381 Other malaise: Secondary | ICD-10-CM

## 2019-03-23 NOTE — Telephone Encounter (Signed)
03/23/2019  Spoke with Wenda Overland at [facility] [employer] [facility] the patient has been placed on the waitlist which is 6 months. Left message on voicemail for [name] to let her know about the [facility] [facility] waitlist. Ambrose Mantle [phone]

## 2019-03-23 NOTE — Telephone Encounter (Signed)
03/23/2019 Spoke with patient's daughter [name] per signed DPR about [facility] and [employer] prepated meals for the elderly.  Let [name] know I would call her as soon as I hear from [employer] [facility] [employer] [facility].  Right now [name] does not have anybody that could pick up the meals from [facility] but will let me know if she does. 03/23/2019 Left message on voicemail for [employer] [facility] [employer] to return my call. Ambrose Mantle [phone]

## 2019-03-25 ENCOUNTER — Ambulatory Visit (INDEPENDENT_AMBULATORY_CARE_PROVIDER_SITE_OTHER): Payer: Medicare Other

## 2019-03-25 ENCOUNTER — Other Ambulatory Visit: Payer: Self-pay

## 2019-03-25 ENCOUNTER — Other Ambulatory Visit: Payer: Self-pay | Admitting: Adult Health

## 2019-03-25 ENCOUNTER — Encounter: Payer: Self-pay | Admitting: Adult Health

## 2019-03-25 ENCOUNTER — Ambulatory Visit (INDEPENDENT_AMBULATORY_CARE_PROVIDER_SITE_OTHER): Payer: Medicare Other | Admitting: Adult Health

## 2019-03-25 DIAGNOSIS — R634 Abnormal weight loss: Secondary | ICD-10-CM | POA: Diagnosis not present

## 2019-03-25 DIAGNOSIS — J84112 Idiopathic pulmonary fibrosis: Secondary | ICD-10-CM

## 2019-03-25 DIAGNOSIS — J9311 Primary spontaneous pneumothorax: Secondary | ICD-10-CM

## 2019-03-25 DIAGNOSIS — J9381 Chronic pneumothorax: Secondary | ICD-10-CM

## 2019-03-25 LAB — TOXASSURE SELECT 13 (MW), URINE

## 2019-03-25 NOTE — Progress Notes (Signed)
@Patient  ID: [name], female    DOB: Jul 20, 1925, 86 y.o.   MRN: [id number]  Chief Complaint  Patient presents with  . Follow-up    ILD     Referring provider: Janora Norlander, DO  HPI: 86 year old female never smoker followed for interstitial lung disease-clinical diagnosis of IPF Stable lung nodule since 2012 Early 2020 with small right pneumothorax-recurrent and persistent bilateral pneumothoraces  TEST/EVENTS :  CT chest December 2017 bilateral interstitial reticulation with subpleural honeycombing with traction bronchiectasis compatible with chronic interstitial lung disease, stable pulmonary nodules,  High-resolution CT chest January 29, 2019-trace residual left sided pneumothorax, small volume pneumomediastinum adjacent to the distal esophagus ILD changes  03/25/2019 Follow up : ILD , bilateral pneumothoraces Patient presents for a 23-month follow-up.  Patient has underlying severe interstitial lung disease with clinical diagnosis of IPF.  Over the past year she has had difficulty with recurrent and persistent bilateral pneumothoraces.  The started in March 2020 with a small right pneumothorax.  This did completely resolved on follow-up chest x-ray in April.  However in October she was noted to have a small right apical pneumothorax.  And in December notable bilateral small pneumothoraces.  High-resolution CT chest in January showed a small trace left-sided pneumothorax and a small volume pneumonia mediastinum.  Patient is accompanied by her family member today.  Says that her breathing is doing about the same she gets very short of breath with minimal activity.  Has decreased activity tolerance.  Over the last 2 months she has had failure to thrive symptoms with decreased appetite worsening of her dementia and increased confusion and agitation.  Patient's primary care doctor has ordered palliative care consult which is scheduled for later this week.  Today in the office  patient is notably confused intermittently. O2 saturations remain 92% on room air at rest.  Patient's family member says at home she averages around 92 to 96%.  We discussed getting a overnight oximetry test to check for possible nocturnal hypoxia however they declined at this time want to wait on palliative care.  Patient's weight continues to slowly decline.  She is currently 93 pounds. Today shows stable bilateral pulmonary fibrosis.  Trace left greater than right apical pneumothoraces without change.  Allergies  Allergen Reactions  . Levaquin [Levaquin Leva-Pak] Shortness Of Breath and Palpitations    Heart palpitations, shortness of breath  . Mucinex [Guaifenesin Er] Other (See Comments)    Causes anxiety  . Bactrim [Sulfamethoxazole-Trimethoprim]     Caused her to "feel sick" and resulted in dehydration  . Bentyl [Dicyclomine Hcl] Other (See Comments)    shakes  . Macrobid [Nitrofurantoin]     Not recommended given interstitial lung disease  . Yellow Dyes (Non-Tartrazine) Other (See Comments)    unknown  . Erythromycin Diarrhea, Nausea Only and Rash  . Penicillins Rash    Did it involve swelling of the face/tongue/throat, SOB, or low BP? No Did it involve sudden or severe rash/hives, skin peeling, or any reaction on the inside of your mouth or nose? No Did you need to seek medical attention at a hospital or doctor's office? No When did it last happen? If all above answers are "NO", may proceed with cephalosporin use.    Immunization History  Administered Date(s) Administered  . Fluad Quad(high Dose 65+) 10/26/2018  . Influenza Split 10/28/2012  . Influenza Whole 09/14/2009  . Influenza, High Dose Seasonal PF 10/07/2016, 11/11/2017  . Influenza, Quadrivalent, Recombinant, Inj, Pf 11/08/2015  .  Influenza,inj,Quad PF,6+ Mos 10/24/2014  . Influenza,inj,quad, With Preservative 11/10/2013  . Pneumococcal Conjugate-13 12/31/2012  . Pneumococcal Polysaccharide-23 10/14/1996    . Td 10/15/2002  . Tdap 04/12/2010  . Zoster 02/05/2010    Past Medical History:  Diagnosis Date  . Arthritis   . Cataract   . CHF (congestive heart failure) ([place])    a. EF 35% by echo in 10/2018  . Chronic back pain   . Dry eyes   . Glaucoma   . Hyperlipidemia   . Idiopathic pulmonary fibrosis ([place])   . Lumbar stenosis   . Osteoarthrosis and allied disorders   . Osteoporosis   . Peripheral neuropathy   . Vitamin D deficiency     Tobacco History: Social History   Tobacco Use  Smoking Status Never Smoker  Smokeless Tobacco Current User  . Types: Chew   Ready to quit: Not Answered Counseling given: Not Answered   Outpatient Medications Prior to Visit  Medication Sig Dispense Refill  . albuterol (PROVENTIL) (2.5 MG/3ML) 0.083% nebulizer solution NEBULIZE 1 VIAL EVERY 6 HOURS AS NEEDED (Patient taking differently: Take 2.5 mg by nebulization at bedtime. ) 150 mL 5  . alendronate (FOSAMAX) 70 MG tablet TAKE 1 TABLET WEEKLY (TAKE WITH 8OZ OF WATER 30 MINUTES BEFORE BREAKFAST) 12 tablet 2  . aspirin EC 81 MG tablet Take 1 tablet (81 mg total) by mouth daily. 90 tablet 3  . BREO ELLIPTA 100-25 MCG/INH AEPB USE 1 INHALATION DAILY 60 each 2  . busPIRone (BUSPAR) 10 MG tablet TAKE ONE (1) TABLET THREE (3) TIMES EACH DAY 270 tablet 1  . calcium carbonate (OS-CAL) 600 MG TABS tablet Take 600 mg by mouth daily with breakfast.    . cetirizine (ZYRTEC) 10 MG chewable tablet 1/2 tablet daily     . cholecalciferol (VITAMIN D) 1000 UNITS tablet Take 1,000 Units by mouth daily.    . famotidine (PEPCID) 20 MG tablet Take 20 mg by mouth daily.    . fluticasone (FLONASE) 50 MCG/ACT nasal spray USE 2 SPRAYS IN EACH NOSTRIL DAILY 16 g 11  . furosemide (LASIX) 20 MG tablet Take 20 mg by mouth every other day. & one extra tablet as needed for weight gain (3 lbs/24 hours or 5 lbs/one week)  or swelling    . ibuprofen (ADVIL,MOTRIN) 200 MG tablet Take 200 mg by mouth every 6 (six) hours as  needed.    Marland Kitchen LORazepam (ATIVAN) 0.5 MG tablet Take 0.5 tablets (0.25 mg total) by mouth daily as needed for anxiety or sedation. 15 tablet 0  . megestrol (MEGACE) 40 MG tablet TAKE 1/2 TABLET TWICE DAILY 90 tablet 0  . Multiple Vitamins-Minerals (CENTRUM SILVER) tablet Take 1 tablet by mouth every evening.    . nitroGLYCERIN (NITROSTAT) 0.4 MG SL tablet Place 1 tablet (0.4 mg total) under the tongue every 5 (five) minutes x 3 doses as needed for chest pain (if no relief after 3rd dose, proceed to the ED for an evaluation or call 911). 25 tablet 3  . simvastatin (ZOCOR) 40 MG tablet TAKE ONE (1) TABLET EACH DAY 90 tablet 0  . spironolactone (ALDACTONE) 25 MG tablet TAKE 1/2 TABLET DAILY 45 tablet 1  . trimethoprim (TRIMPEX) 100 MG tablet Take 1 tablet (100 mg total) by mouth daily. 90 tablet 0  . cefdinir (OMNICEF) 300 MG capsule Take 1 capsule (300 mg total) by mouth 2 (two) times daily. 1 po BID 20 capsule 0   Facility-Administered Medications Prior to Visit  Medication Dose Route Frequency Provider Last Rate Last Admin  . cyanocobalamin ((VITAMIN B-12)) injection 1,000 mcg  1,000 mcg Intramuscular Q30 days Chipper Herb, MD   1,000 mcg at 03/16/19 1634     Review of Systems:   Constitutional:  +  weight loss,  fatigue, or  lassitude.  HEENT:   No headaches,  Difficulty swallowing,  Tooth/dental problems, or  Sore throat,                No sneezing, itching, ear ache, nasal congestion, post nasal drip,   CV:  No chest pain,  Orthopnea, PND, swelling in lower extremities, anasarca, dizziness, palpitations, syncope.   GI  No heartburn, indigestion, abdominal pain, nausea, vomiting, diarrhea, change in bowel habits, loss of appetite, bloody stools.   Resp:  No chest wall deformity  Skin: no rash or lesions.  GU: no dysuria, change in color of urine, no urgency or frequency.  No flank pain, no hematuria   MS:  No joint pain or swelling.  No decreased range of motion.  No back  pain.    Physical Exam  BP 128/62 (BP Location: Right Arm, Cuff Size: Normal)   Pulse 84   Temp (!) 97.1 F (36.2 C) (Temporal)   Ht 4\' 9"  (1.448 m)   Wt 93 lb 6.4 oz (42.4 kg)   SpO2 92% Comment: RA  BMI 20.21 kg/m   GEN: A/Ox3; pleasant , NAD, elderly, frail in wheelchair   HEENT:  [place]/AT,  NOSE-clear, THROAT-clear, no lesions, no postnasal drip or exudate noted.   NECK:  Supple w/ fair ROM; no JVD; normal carotid impulses w/o bruits; no thyromegaly or nodules palpated; no lymphadenopathy.    RESP bibasilar crackles  no dullness to percussion  CARD:  RRR, no m/r/g, tr peripheral edema, pulses intact, no cyanosis or clubbing.  GI:   Soft & nt; nml bowel sounds; no organomegaly or masses detected.   Musco: Warm bil, no deformities or joint swelling noted.   Neuro: alert, no focal deficits noted.    Skin: Warm, no lesions or rashes    Lab Results:  CBC  BMET  Imaging: DG Chest 2 View  Result Date: 03/25/2019 CLINICAL DATA:  Follow-up pneumothoraces EXAM: CHEST - 2 VIEW COMPARISON:  12/24/2018, 02/08/2019, CT 01/29/2019 FINDINGS: Bilateral reticular opacity consistent with interstitial pulmonary fibrosis. No acute consolidation or pleural effusion. Stable mediastinal contour and heart size. Aortic atherosclerosis. Trace leftgreater than right apical pneumothoraces without change. IMPRESSION: No change in left greater than right trace apical pneumothoraces. Chronic interstitial lung disease. Electronically Signed   By: Donavan Foil M.D.   On: 03/25/2019 16:21    cyanocobalamin ((VITAMIN B-12)) injection 1,000 mcg    Date Action Dose Route User   03/16/2019 1634 Given 1,000 mcg Intramuscular (Right Deltoid) Carrolyn Leigh, LPN   [id number] Given 1,000 mcg Intramuscular (Right Deltoid) Rintelmann, Gina C, LPN      No flowsheet data found.  No results found for: NITRICOXIDE      Assessment & Plan:   IPF (idiopathic pulmonary fibrosis) (HCC) IPF does  appear stable however patient is very frail with multiple medical comorbidities and appearance of worsening dementia. O2 saturations are stable on room air. Palliative care consult is pending. Advised can follow-up with pulmonary on a as needed basis.  Pneumothorax Chronic trace bilateral apical pneumothoraces.-These remain stable on chest x-ray.  Advised to avoid heavy lifting or bending over for prolonged period of time. Advised to contact us  if she has increased shortness of breath or chest pain. Follow-up as needed  Weight loss Ongoing weight loss with protein calorie malnutrition and suspected failure to thrive.  Continue follow-up with primary care and palliative care consult is pending.  Patient has underlying dementia.     Rexene Edison, NP 03/25/2019

## 2019-03-25 NOTE — Assessment & Plan Note (Signed)
IPF does appear stable however patient is very frail with multiple medical comorbidities and appearance of worsening dementia. O2 saturations are stable on room air. Palliative care consult is pending. Advised can follow-up with pulmonary on a as needed basis.

## 2019-03-25 NOTE — Patient Instructions (Signed)
Continue on BREO daily.  Rinse after use.  Palliative care consult as planned.  NO heavy lifting. No bending over for prolonged periods of time.   Follow up with Dr. Chase Caller Or Owenn Rothermel As needed   Please contact office for sooner follow up if symptoms do not improve or worsen or seek emergency care

## 2019-03-25 NOTE — Assessment & Plan Note (Signed)
Chronic trace bilateral apical pneumothoraces.-These remain stable on chest x-ray.  Advised to avoid heavy lifting or bending over for prolonged period of time. Advised to contact us if she has increased shortness of breath or chest pain. Follow-up as needed

## 2019-03-25 NOTE — Assessment & Plan Note (Signed)
Ongoing weight loss with protein calorie malnutrition and suspected failure to thrive.  Continue follow-up with primary care and palliative care consult is pending.  Patient has underlying dementia.

## 2019-03-26 ENCOUNTER — Other Ambulatory Visit: Payer: Self-pay | Admitting: Family Medicine

## 2019-03-26 ENCOUNTER — Encounter: Payer: Self-pay | Admitting: Family Medicine

## 2019-03-26 DIAGNOSIS — R627 Adult failure to thrive: Secondary | ICD-10-CM

## 2019-03-26 DIAGNOSIS — R1310 Dysphagia, unspecified: Secondary | ICD-10-CM

## 2019-03-26 NOTE — Progress Notes (Signed)
bariu 

## 2019-03-26 NOTE — Telephone Encounter (Signed)
Please let the daughter [name] know that it was a joy taking care of Ms. Kosman and I absolutely enjoy the visits.  She can always reach [place] for any questions she might have

## 2019-03-26 NOTE — Telephone Encounter (Signed)
Tammy and MR, please see mychart message from pt's daughter [name] as an 68.

## 2019-03-27 ENCOUNTER — Other Ambulatory Visit: Payer: Self-pay | Admitting: Family Medicine

## 2019-03-27 DIAGNOSIS — J849 Interstitial pulmonary disease, unspecified: Secondary | ICD-10-CM

## 2019-03-30 ENCOUNTER — Other Ambulatory Visit: Payer: Self-pay | Admitting: Family Medicine

## 2019-04-05 ENCOUNTER — Telehealth: Payer: Self-pay | Admitting: Family Medicine

## 2019-04-05 DIAGNOSIS — N390 Urinary tract infection, site not specified: Secondary | ICD-10-CM

## 2019-04-05 NOTE — Telephone Encounter (Signed)
Orders placed for urine culture and UA microscopic.  Specimen cup with wipe place up front for family member to pick up.  [name] made aware.

## 2019-04-05 NOTE — Telephone Encounter (Signed)
Please advise, ok to order urine?

## 2019-04-05 NOTE — Telephone Encounter (Signed)
Absolutely fine to place Urinalysis, complete and Urine culture

## 2019-04-07 ENCOUNTER — Telehealth: Payer: Self-pay

## 2019-04-07 ENCOUNTER — Other Ambulatory Visit: Payer: Medicare Other

## 2019-04-07 ENCOUNTER — Other Ambulatory Visit: Payer: Self-pay

## 2019-04-07 DIAGNOSIS — N390 Urinary tract infection, site not specified: Secondary | ICD-10-CM

## 2019-04-07 LAB — URINALYSIS, ROUTINE W REFLEX MICROSCOPIC
Bilirubin, UA: NEGATIVE
Glucose, UA: NEGATIVE
Ketones, UA: NEGATIVE
Nitrite, UA: NEGATIVE
Specific Gravity, UA: >1.03 — ABNORMAL HIGH (ref 1.005–1.030)
Urobilinogen, Ur: 1 mg/dL (ref 0.2–1.0)
pH, UA: 5.5 (ref 5.0–7.5)

## 2019-04-07 LAB — MICROSCOPIC EXAMINATION: WBC, UA: >30 /hpf — AB (ref 0–5)

## 2019-04-07 NOTE — Telephone Encounter (Signed)
04/07/2019 Spoke with patient's daughter [name] about meals on wheels.  I called meals on wheels spoke with [name] and the wait is still 4-6 months.  Cathy [employer]  received money from the [employer] and is delivering food weekly to 2 drop off points in [place]. if the individual qualifies.  I offered to call [name] at [name] if [name] found someone to pick up the meals, but she preferred to call herself if she is able to find someone.  Patient does not need anything else.  Closing referral. Ambrose Mantle [phone]

## 2019-04-09 ENCOUNTER — Ambulatory Visit: Payer: Medicare Other | Admitting: Urology

## 2019-04-09 ENCOUNTER — Telehealth: Payer: Medicare Other | Admitting: Urology

## 2019-04-09 ENCOUNTER — Other Ambulatory Visit: Payer: Self-pay

## 2019-04-09 ENCOUNTER — Telehealth: Payer: Self-pay

## 2019-04-09 LAB — URINE CULTURE

## 2019-04-09 NOTE — Telephone Encounter (Signed)
-----   Message from Irine Seal, MD sent at 04/09/2019 12:44 PM EDT ----- [name],       She had an e. Coli in February that was resistant to ampicillin and keflex.   She was given a therapeutic course of cefdinir by Dr. Lajuana Ripple and then was transitioned to trimethoprim suppression.     A repeat culture on 04/07/19 is growing Mx species only.  She should stay on the TMP daily and just f/u with [place] in 64mo.

## 2019-04-09 NOTE — Telephone Encounter (Signed)
I called and spoke with the [employer]. She agrees with treatment that Dr. Jeffie Pollock recommends and 3 month virtual ov given to granddaughter.

## 2019-04-12 ENCOUNTER — Other Ambulatory Visit: Payer: Self-pay | Admitting: *Deleted

## 2019-04-12 ENCOUNTER — Encounter: Payer: Self-pay | Admitting: Family Medicine

## 2019-04-12 ENCOUNTER — Ambulatory Visit: Payer: Medicare Other | Admitting: Gastroenterology

## 2019-04-12 DIAGNOSIS — R5381 Other malaise: Secondary | ICD-10-CM

## 2019-04-12 DIAGNOSIS — R627 Adult failure to thrive: Secondary | ICD-10-CM

## 2019-04-12 DIAGNOSIS — R630 Anorexia: Secondary | ICD-10-CM

## 2019-04-12 DIAGNOSIS — J84112 Idiopathic pulmonary fibrosis: Secondary | ICD-10-CM

## 2019-04-13 ENCOUNTER — Telehealth: Payer: Self-pay | Admitting: *Deleted

## 2019-04-13 ENCOUNTER — Encounter: Payer: Self-pay | Admitting: Family Medicine

## 2019-04-13 ENCOUNTER — Other Ambulatory Visit: Payer: Self-pay | Admitting: Family Medicine

## 2019-04-13 MED ORDER — FOSFOMYCIN TROMETHAMINE 3 G PO PACK: 3.0000 g | PACK | ORAL | 0 refills | Status: AC

## 2019-04-13 NOTE — Telephone Encounter (Signed)
Medication that was prescribed is too expensive

## 2019-04-13 NOTE — Telephone Encounter (Signed)
Daughter states the fosfomycin (MONUROL) 3 g PACK cost over $300 they can not pay that.

## 2019-04-13 NOTE — Telephone Encounter (Signed)
Prior Auth for  Fosfomycin Tromethamine 3GM packets-Approved    [place] PA Case ID: [id number]   Your information has been submitted to [place] [employer]. [employer] will review the request and will issue a decision, typically within 1-3 days from your submission. You can check the updated outcome later by reopening this request.  If [employer] has not responded in 1-3 days or if you have any questions about your ePA request, please contact [place] [employer] at [phone]. If you think there may be a problem with your PA request, use our live chat feature at the bottom right.    PHARMACY AWARE

## 2019-04-15 ENCOUNTER — Telehealth: Payer: Self-pay

## 2019-04-15 NOTE — Telephone Encounter (Signed)
Fosfomycin Tromethamin Packet approved through [facility] from 01/15/2019-04/12/2020. [facility] [place] made aware.

## 2019-04-20 ENCOUNTER — Encounter: Payer: Self-pay | Admitting: Family Medicine

## 2019-05-15 DEATH — deceased

## 2019-07-09 ENCOUNTER — Telehealth: Payer: Medicare Other | Admitting: Urology

## 2019-09-09 ENCOUNTER — Ambulatory Visit: Payer: Medicare Other | Admitting: Cardiology

## 2020-01-26 IMAGING — DX DG CHEST 2V
2 series · 2 of 2 positions shown · non-contrast
Comparison: CT scan of May 04, 2017. Radiograph March 12, 2017.

CLINICAL DATA: Pneumothorax.

EXAM:
CHEST - 2 VIEW

[chest pa]
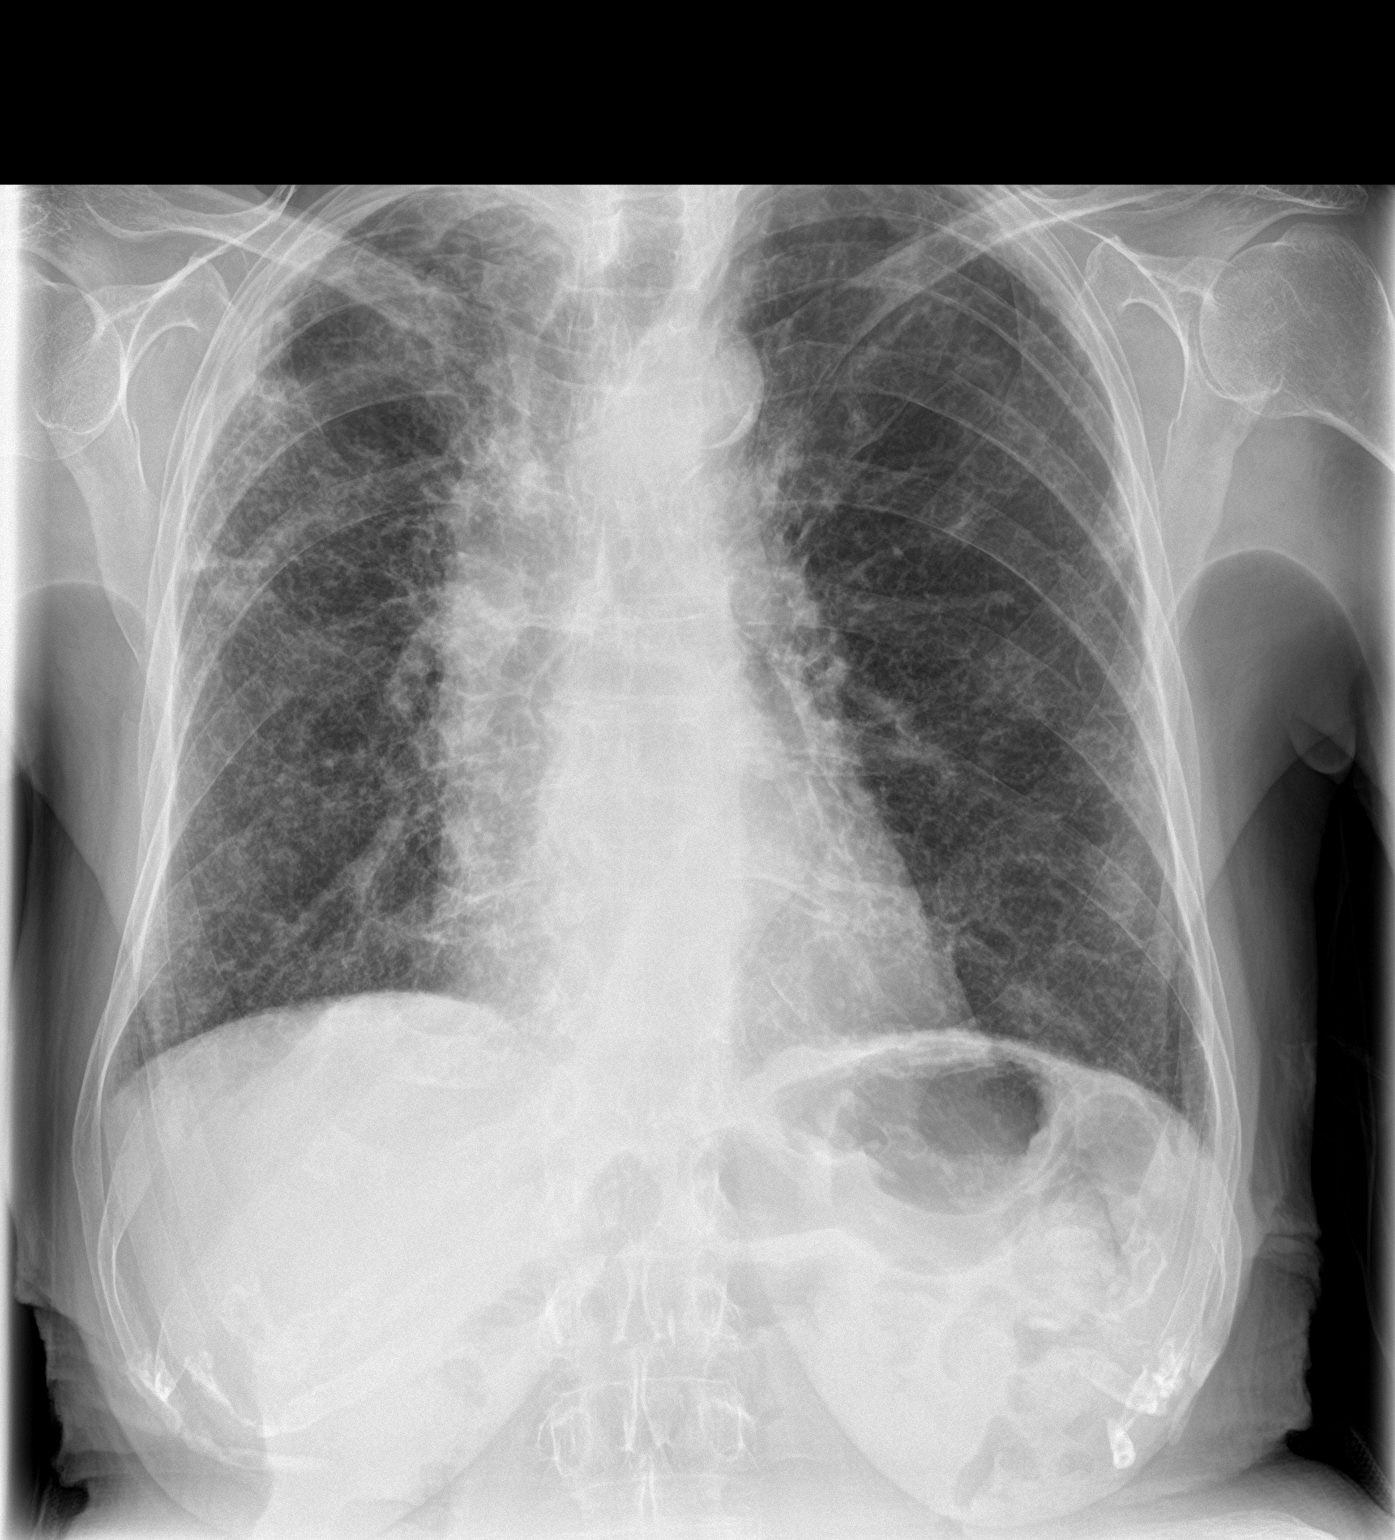

[chest lat]
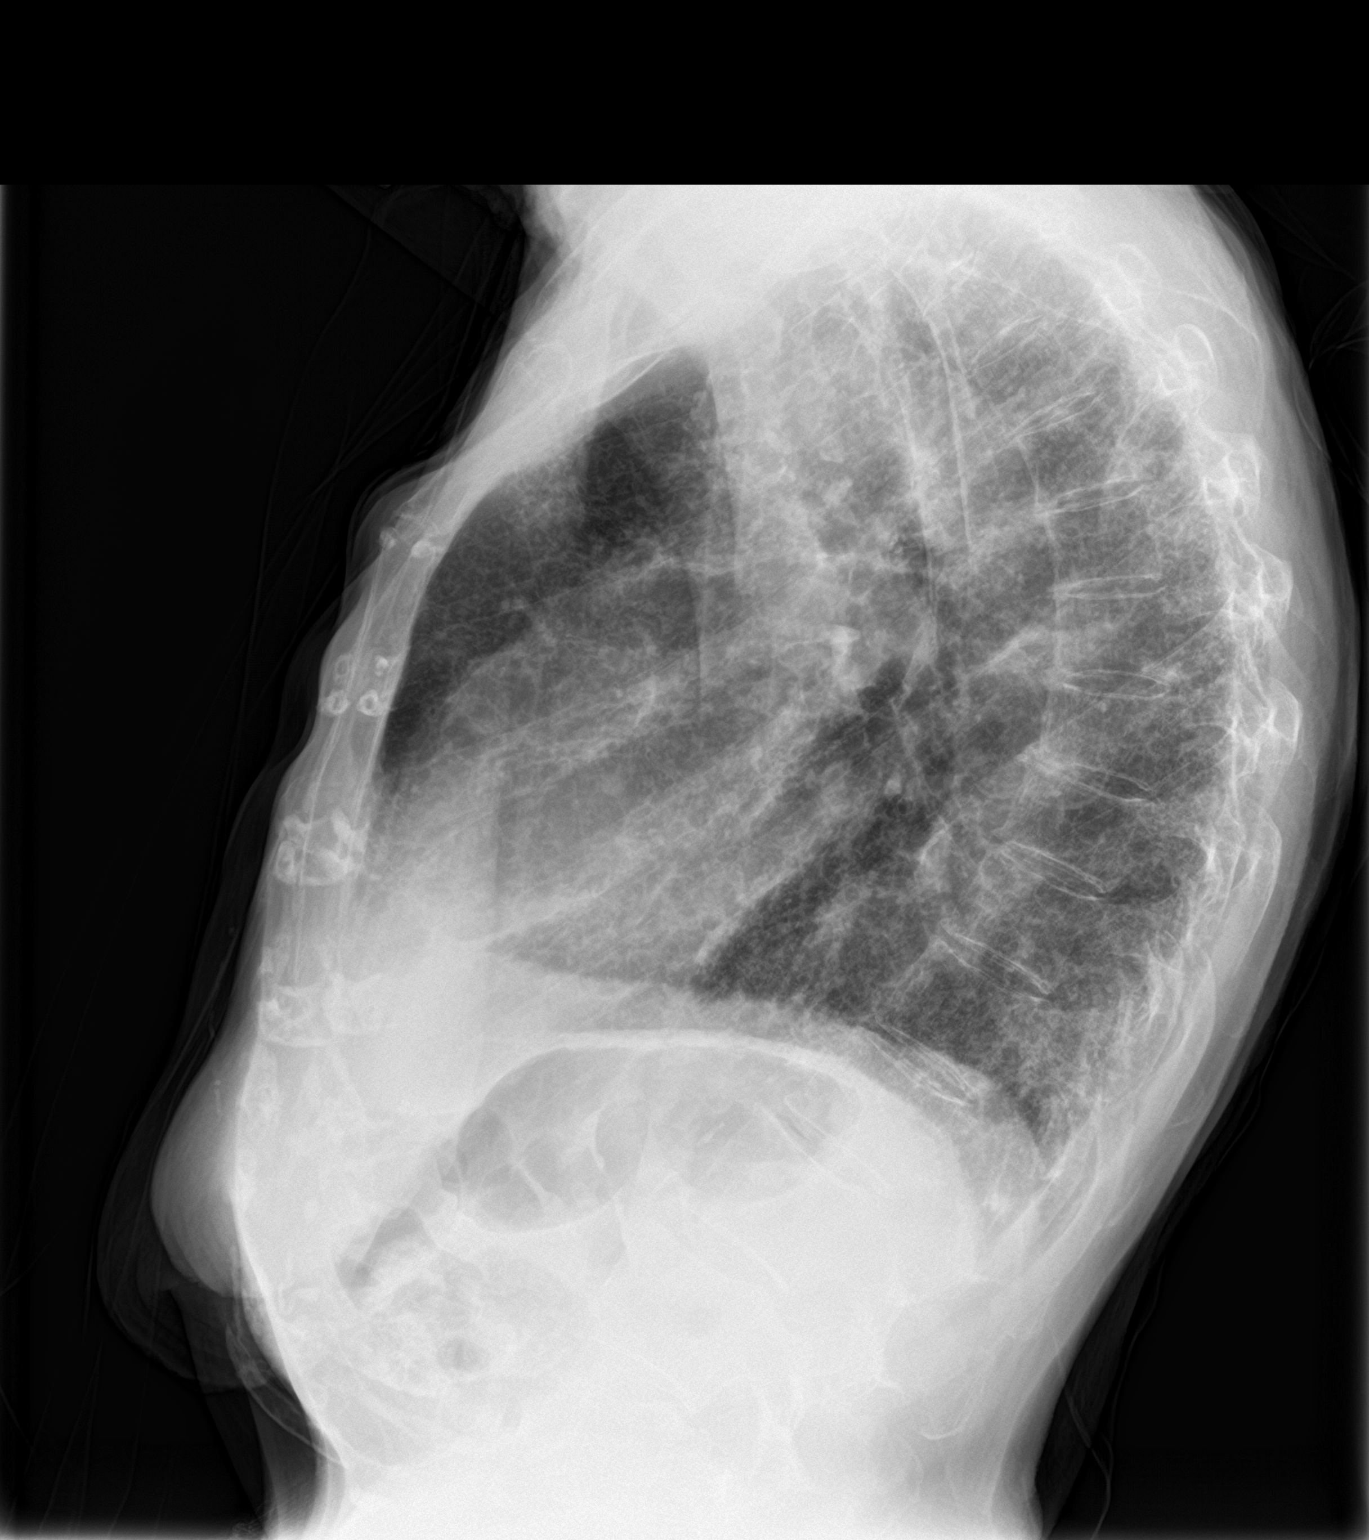

[2 of 2 positions shown; findings below may reference images not displayed]

FINDINGS: The heart size and mediastinal contours are within normal limits. No
pneumothorax or pleural effusion is noted. Stable diffuse
interstitial densities are noted throughout both lungs most
consistent with fibrosis. No acute abnormality is noted.. The
visualized skeletal structures are unremarkable.
IMPRESSION: Stable diffuse lung densities are noted most consistent with
fibrosis. No significant change compared to prior exam.

Aortic Atherosclerosis ([id number]).

## 2021-09-11 IMAGING — DX DG CHEST 2V
2 series · 2 of 2 positions shown · non-contrast
Comparison: 01/01/2019

CLINICAL DATA: Follow-up pneumothorax

EXAM:
CHEST - 2 VIEW

[chest lat]
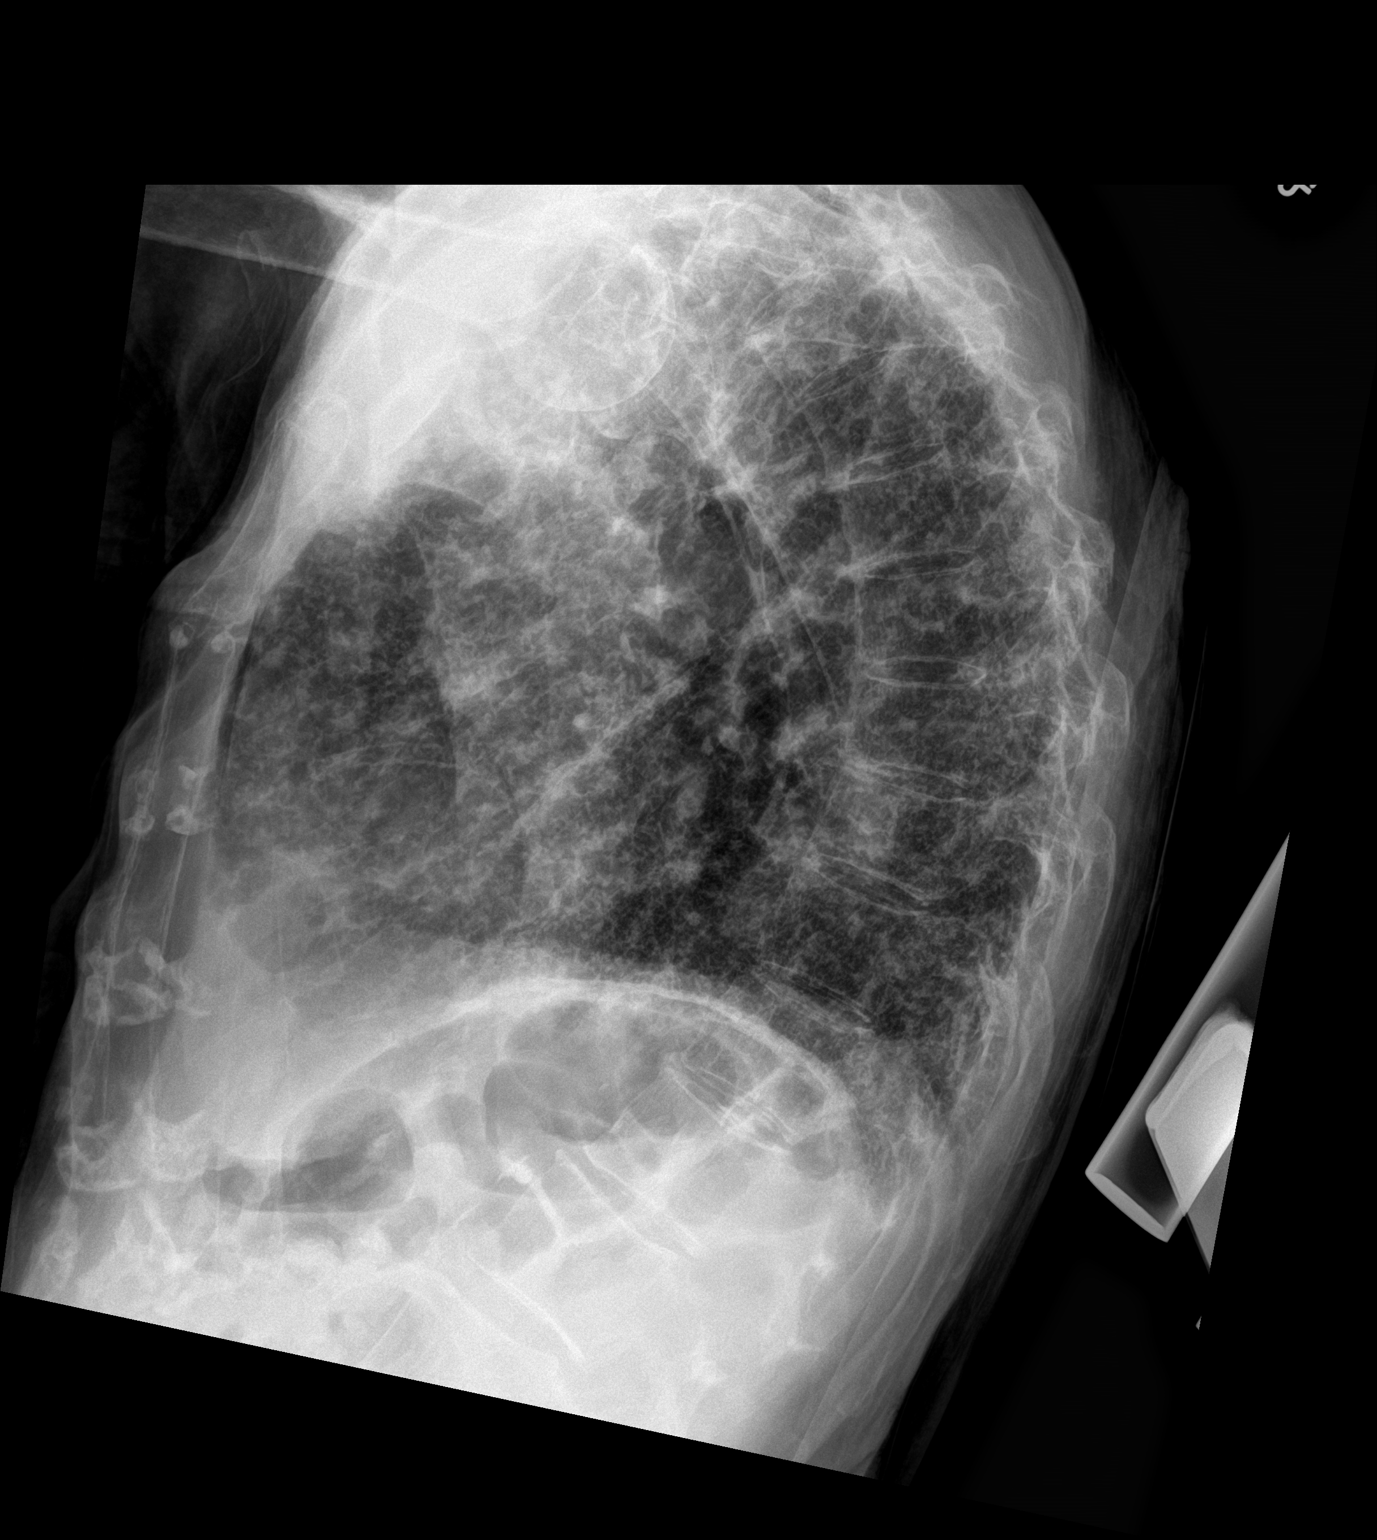

[chest ap]
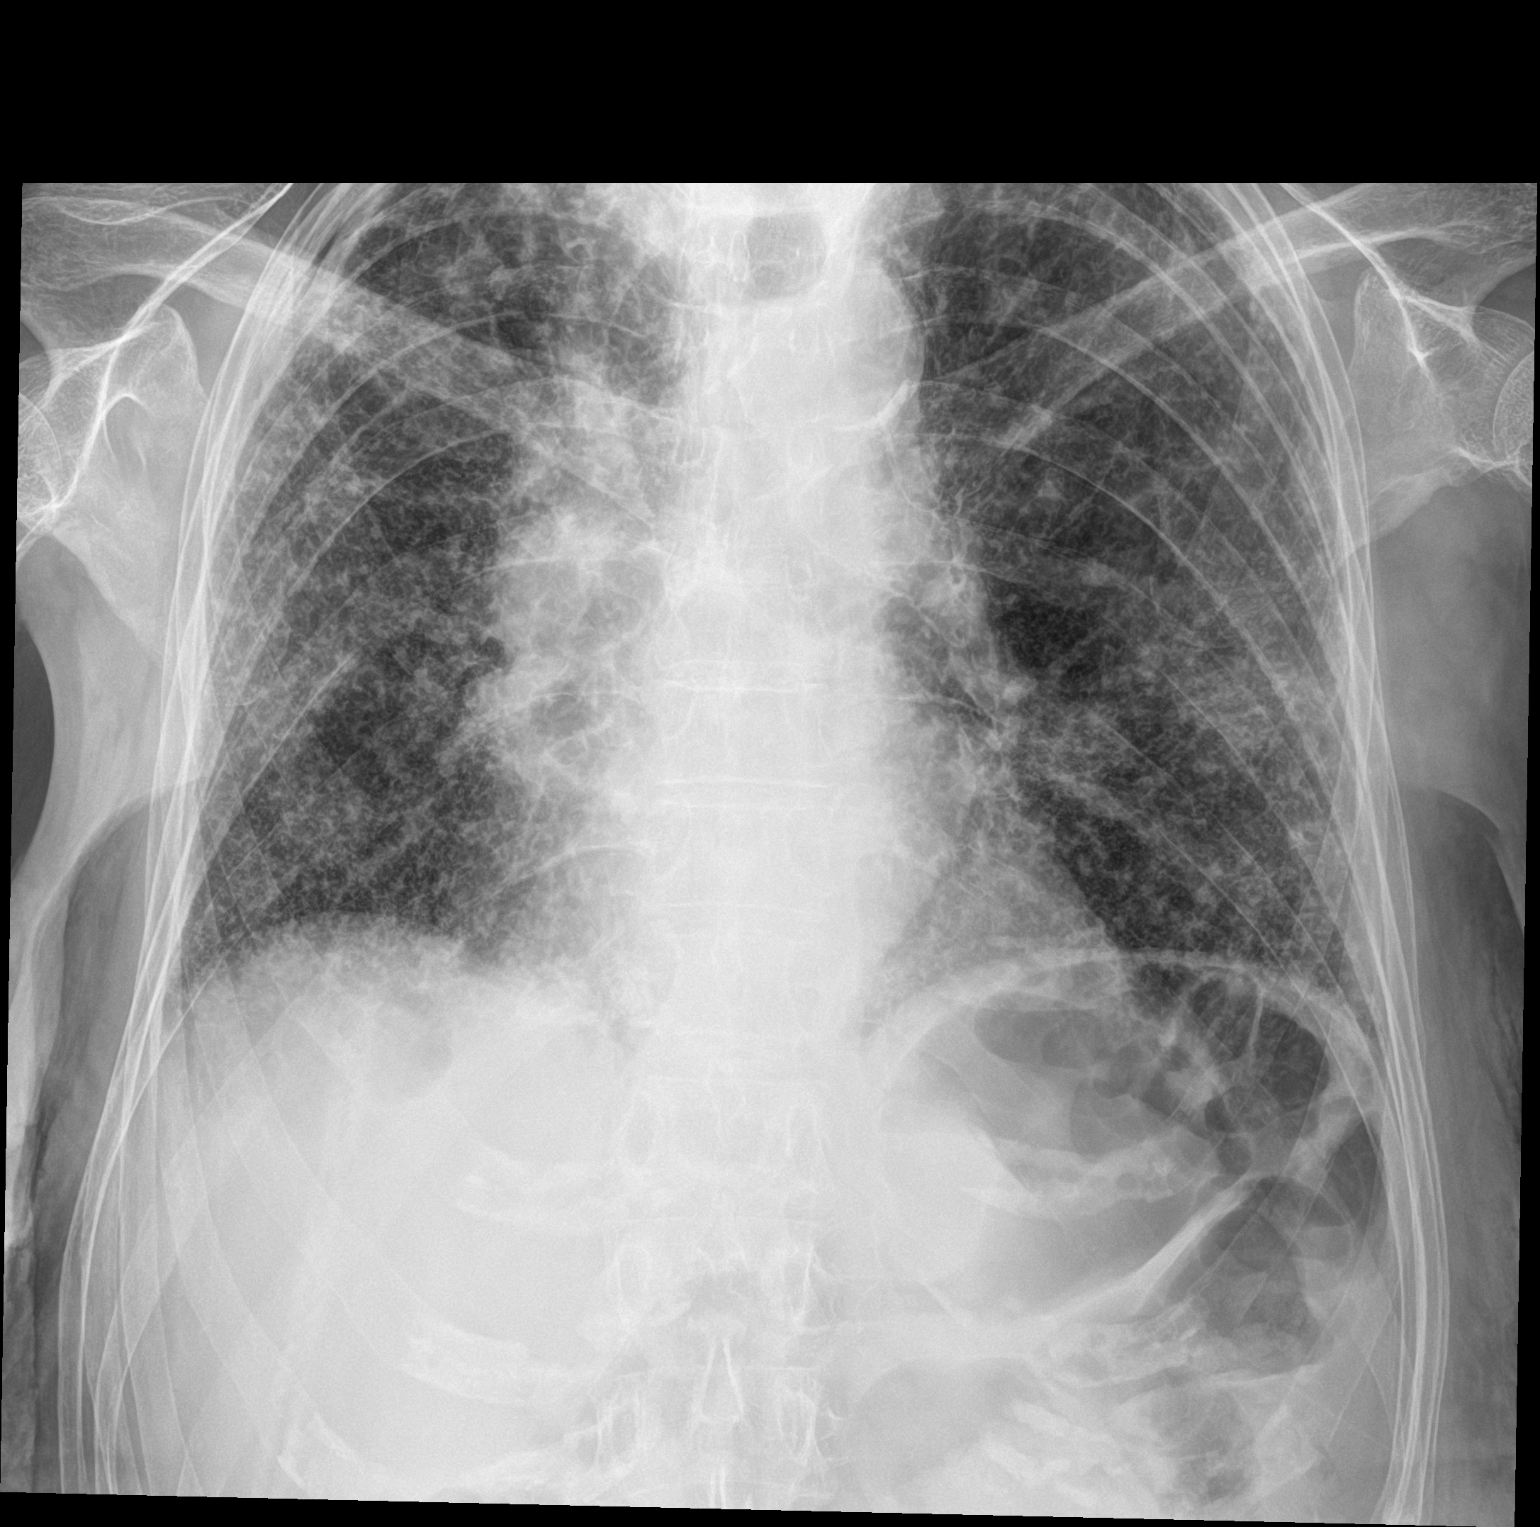

[2 of 2 positions shown; findings below may reference images not displayed]

FINDINGS: No pleural effusion. Coarse reticular opacities bilaterally
consistent with chronic interstitial lung disease/fibrosis. Stable
cardiomediastinal silhouette with aortic atherosclerosis. Trace left
apical pneumothorax without significant change. Small right apical
pneumothorax appears slightly decreased.
IMPRESSION: 1. No significant change in trace left apical pneumothorax
2. Slightly diminished small right apical pneumothorax
3. Extensive reticular opacity consistent with chronic interstitial
lung disease and fibrosis
